# Patient Record
Sex: Male | Born: 1960 | Race: Black or African American | Hispanic: No | Marital: Single | Smoking: Never smoker
Health system: Southern US, Community
[De-identification: ages and names within clinical notes are randomized; demographics above are authoritative.]

## PROBLEM LIST (undated history)

## (undated) DIAGNOSIS — N183 Chronic kidney disease, stage 3 unspecified: Secondary | ICD-10-CM

## (undated) DIAGNOSIS — I10 Essential (primary) hypertension: Secondary | ICD-10-CM

## (undated) DIAGNOSIS — M1990 Unspecified osteoarthritis, unspecified site: Secondary | ICD-10-CM

## (undated) DIAGNOSIS — I4891 Unspecified atrial fibrillation: Secondary | ICD-10-CM

## (undated) DIAGNOSIS — I429 Cardiomyopathy, unspecified: Secondary | ICD-10-CM

## (undated) DIAGNOSIS — D638 Anemia in other chronic diseases classified elsewhere: Secondary | ICD-10-CM

## (undated) DIAGNOSIS — M109 Gout, unspecified: Secondary | ICD-10-CM

## (undated) DIAGNOSIS — G4733 Obstructive sleep apnea (adult) (pediatric): Secondary | ICD-10-CM

## (undated) HISTORY — DX: Obstructive sleep apnea (adult) (pediatric): G47.33

## (undated) HISTORY — DX: Anemia in other chronic diseases classified elsewhere: D63.8

## (undated) HISTORY — DX: Unspecified atrial fibrillation: I48.91

## (undated) HISTORY — DX: Chronic kidney disease, stage 3 unspecified: N18.30

---

## 2005-04-08 ENCOUNTER — Emergency Department: Payer: Self-pay | Admitting: Unknown Physician Specialty

## 2005-05-26 ENCOUNTER — Emergency Department: Payer: Self-pay | Admitting: Emergency Medicine

## 2010-04-01 ENCOUNTER — Emergency Department: Payer: Self-pay | Admitting: Emergency Medicine

## 2010-04-02 ENCOUNTER — Emergency Department: Payer: Self-pay | Admitting: Emergency Medicine

## 2011-10-16 ENCOUNTER — Emergency Department: Payer: Self-pay | Admitting: Unknown Physician Specialty

## 2013-09-10 ENCOUNTER — Emergency Department: Payer: Self-pay | Admitting: Emergency Medicine

## 2014-05-12 ENCOUNTER — Emergency Department: Payer: Self-pay | Admitting: Emergency Medicine

## 2014-05-15 ENCOUNTER — Emergency Department: Payer: Self-pay | Admitting: Emergency Medicine

## 2015-05-07 ENCOUNTER — Emergency Department
Admission: EM | Admit: 2015-05-07 | Discharge: 2015-05-07 | Disposition: A | Discharge status: Home / Self Care | Payer: PRIVATE HEALTH INSURANCE | Attending: Emergency Medicine | Admitting: Emergency Medicine

## 2015-05-07 ENCOUNTER — Encounter: Payer: Self-pay | Admitting: Emergency Medicine

## 2015-05-07 DIAGNOSIS — Z792 Long term (current) use of antibiotics: Secondary | ICD-10-CM | POA: Insufficient documentation

## 2015-05-07 DIAGNOSIS — L03115 Cellulitis of right lower limb: Secondary | ICD-10-CM | POA: Insufficient documentation

## 2015-05-07 MED ORDER — TRAMADOL HCL 50 MG PO TABS: 50.0000 mg | ORAL_TABLET | Freq: Four times a day (QID) | ORAL | Status: DC | PRN

## 2015-05-07 MED ORDER — SULFAMETHOXAZOLE-TRIMETHOPRIM 800-160 MG PO TABS: 1.0000 | ORAL_TABLET | Freq: Two times a day (BID) | ORAL | Status: DC

## 2015-05-07 NOTE — ED Notes (Addendum)
Pt presents to ER alert and in NAD. Pt reports abscess on left inner thigh x 1 week. Pt has small area of skin missing from left groin area. No obvious signs of infx.

## 2015-05-07 NOTE — ED Notes (Addendum)
Patient c/o 0.5"x0.25" open wound to his left medial anterior superior thigh. Denies fever

## 2015-05-07 NOTE — ED Provider Notes (Signed)
[facility] Emergency Department Provider Note  ____________________________________________  Time seen: Approximately07:23 AM  I have reviewed the triage vital signs and the nursing notes.   HISTORY  Chief Complaint Abscess    HPI Steven Pham is a 54 y.o. male who presents with 1 week history of left inner thigh pain and redness. He states the pain is getting worse and the area has been draining a clear/yellow discharge.   History reviewed. No pertinent past medical history.  There are no active problems to display for this patient.   History reviewed. No pertinent past surgical history.  Current Outpatient Rx  Name  Route  Sig  Dispense  Refill  . sulfamethoxazole-trimethoprim (BACTRIM DS,SEPTRA DS) 800-160 MG per tablet   Oral   Take 1 tablet by mouth 2 (two) times daily.   20 tablet   0   . traMADol (ULTRAM) 50 MG tablet   Oral   Take 1 tablet (50 mg total) by mouth every 6 (six) hours as needed.   9 tablet   0     Allergies Review of patient's allergies indicates no known allergies.  History reviewed. No pertinent family history.  Social History History  Substance Use Topics  . Smoking status: Never Smoker   . Smokeless tobacco: Not on file  . Alcohol Use: No    Review of Systems Constitutional: No fever/chills Eyes: No visual changes. ENT: No sore throat. Gastrointestinal: No abdominal pain.  No nausea, no vomiting.Musculoskeletal: Negative for back pain. Skin: Negative for rash. Positive for tenderness and draining of left upper leg near groin. Neurological: Negative for headaches, focal weakness or numbness.  10-point ROS otherwise negative.  ____________________________________________   PHYSICAL EXAM:  VITAL SIGNS: ED Triage Vitals  Enc Vitals Group     BP 05/07/15 0554 161/97 mmHg     Pulse Rate 05/07/15 0554 70     Resp 05/07/15 0554 18     Temp 05/07/15 0554 97.7 F (36.5 C)     Temp Source 05/07/15  0554 Oral     SpO2 05/07/15 0554 96 %     Weight 05/07/15 0554 245 lb (111.131 kg)     Height 05/07/15 0554  (1.6 m)     Head Cir --      Peak Flow --      Pain Score 05/07/15 0554 6     Pain Loc --      Pain Edu? --      Excl. in GC? --     Constitutional: Alert and oriented. Well appearing and in no acute distress. Eyes: Conjunctivae are normal. PERRL. EOMI. Head: Atraumatic. Nose: No congestion/rhinnorhea. Mouth/Throat: Mucous membranes are moist.   Neck: No stridor.   Cardiovascular: Normal rate, regular rhythm. Respiratory: Normal respiratory effort. Gastrointestinal: Soft and nontender. Musculoskeletal: No lower extremity tenderness nor edema.  No joint effusions. Neurologic:  Normal speech and language. No gross focal neurologic deficits are appreciated. Speech is normal. No gait instability. Skin:  Left inner thigh/groin erythematous with 0.5cm superficial ulceration. No drainage noted. Area of induration approximately 3cm. No fluctuant area identified. Psychiatric: Mood and affect are normal. Speech and behavior are normal.  ____________________________________________   LABS (all labs ordered are listed, but only abnormal results are displayed)  Labs Reviewed - No data to display ____________________________________________  EKG   ____________________________________________  RADIOLOGY  ____________________________________________   PROCEDURES  Procedure(s) performed: None  Critical Care performed: No  ____________________________________________   INITIAL IMPRESSION / ASSESSMENT AND PLAN /  ED COURSE  Pertinent labs & imaging results that were available during my care of the patient were reviewed by me and considered in my medical decision making (see chart for details).  Patient was advised to follow up with the primary care provider of his choice or return to the ER for symptoms that are not improving over the next 2 days. Return precautions  reviewed. ____________________________________________   FINAL CLINICAL IMPRESSION(S) / ED DIAGNOSES  Final diagnoses:  Cellulitis of right leg      Chinita Pester, FNP 05/07/15 1556  Sharyn Creamer, MD 05/13/15 1705

## 2015-05-12 ENCOUNTER — Emergency Department
Admission: EM | Admit: 2015-05-12 | Discharge: 2015-05-12 | Disposition: A | Discharge status: Home / Self Care | Payer: PRIVATE HEALTH INSURANCE | Attending: Emergency Medicine | Admitting: Emergency Medicine

## 2015-05-12 DIAGNOSIS — L02416 Cutaneous abscess of left lower limb: Secondary | ICD-10-CM | POA: Diagnosis not present

## 2015-05-12 DIAGNOSIS — L03116 Cellulitis of left lower limb: Secondary | ICD-10-CM | POA: Diagnosis not present

## 2015-05-12 DIAGNOSIS — Z7689 Persons encountering health services in other specified circumstances: Secondary | ICD-10-CM

## 2015-05-12 DIAGNOSIS — Z0189 Encounter for other specified special examinations: Secondary | ICD-10-CM

## 2015-05-12 DIAGNOSIS — L0291 Cutaneous abscess, unspecified: Secondary | ICD-10-CM

## 2015-05-12 DIAGNOSIS — L089 Local infection of the skin and subcutaneous tissue, unspecified: Secondary | ICD-10-CM | POA: Diagnosis present

## 2015-05-12 DIAGNOSIS — L0390 Cellulitis, unspecified: Secondary | ICD-10-CM

## 2015-05-12 MED ORDER — LIDOCAINE-EPINEPHRINE (PF) 1 %-1:200000 IJ SOLN
INTRAMUSCULAR | Status: AC
  Filled 2015-05-12: qty 30

## 2015-05-12 MED ORDER — SULFAMETHOXAZOLE-TRIMETHOPRIM 800-160 MG PO TABS: 2.0000 | ORAL_TABLET | Freq: Two times a day (BID) | ORAL | Status: DC

## 2015-05-12 NOTE — ED Provider Notes (Signed)
[facility] Emergency Department Provider Note ?____________________________________________ ? Time seen: 1046 ? I have reviewed the triage vital signs and the nursing notes. ________ HISTORY ? Chief Complaint Recurrent Skin Infections  HPI  Steven Pham is a 54 y.o. male who returns to the ED for a reevaluation of a left inner thigh abscess. He describes continuous drainage to the area. But notes it is still painful at present. He's been dosing the antibiotics as prescribed however only has one tablet left out of a 10 day course which is not set to expire until 5/18. He denies fevers, chills, sweats, nausea, vomiting, or spreading cellulitis.  No past medical history on file.  There are no active problems to display for this patient. ? No past surgical history on file. ? Current Outpatient Rx  Name  Route  Sig  Dispense  Refill  . sulfamethoxazole-trimethoprim (BACTRIM DS,SEPTRA DS) 800-160 MG per tablet   Oral   Take 2 tablets by mouth 2 (two) times daily.   30 tablet   0   . traMADol (ULTRAM) 50 MG tablet   Oral   Take 1 tablet (50 mg total) by mouth every 6 (six) hours as needed.   9 tablet   0   ? Allergies Review of patient's allergies indicates no known allergies. ? No family history on file. ? Social History History  Substance Use Topics  . Smoking status: Never Smoker   . Smokeless tobacco: Not on file  . Alcohol Use: No   Review of Systems Constitutional: Negative for fever. HEENT:  Normocephalic/atraumatic. Negative for visual/hearingchanges, sore throat, or nasal congestion. Cardiovascular: Negative for chest pain. Respiratory: Negative for shortness of breath. Musculoskeletal: Negative for back pain. Skin: Positive for draining abscess with local cellulitis. Neurological: Negative for headaches, focal weakness or numbness. Hematological/Lymphatic:Negative for enlarged lymph nodes  10-point ROS otherwise  negative. ____________________________________________  PHYSICAL EXAM:  VITAL SIGNS: ED Triage Vitals  Enc Vitals Group     BP 05/12/15 0959 138/95 mmHg     Pulse Rate 05/12/15 0959 65     Resp 05/12/15 0959 18     Temp 05/12/15 0959 97.5 F (36.4 C)     Temp Source 05/12/15 0959 Oral     SpO2 05/12/15 0959 100 %     Weight 05/12/15 0959 245 lb (111.131 kg)     Height 05/12/15 0959  (1.626 m)     Head Cir --      Peak Flow --      Pain Score 05/12/15 1000 8     Pain Loc --      Pain Edu? --      Excl. in GC? --    Constitutional: Alert and oriented. Well appearing and in no distress. HEENT: Normocephalic and atraumatic.Conjunctivae are normal. PERRL. Normal extraocular movements. Mucous membranes are moist. Respiratory: Normal respiratory effort.  Gastrointestinal: Soft and nontender. No distention.  Genitourinary: deferred Musculoskeletal: Nontender with normal range of motion in all extremities. No joint effusions.  No lower extremity tenderness nor edema. Neurologic:  Normal speech and language. No gross focal neurologic deficits are appreciated.  Skin:  Enlarging left inner thigh cellulitis with large, deep area in induration & loculation. Spontaneous drainage from fluctuant punctum.  Psychiatric: Mood and affect are normal. Speech and behavior are normal. Patient exhibits appropriate insight and judgment.  _____________ PROCEDURES ? INCISION AND DRAINAGE Performed by: Lissa Hoard Consent: Verbal consent obtained. Risks and benefits: risks, benefits and alternatives were discussed  Type: abscess  Body area: left thigh  Anesthesia: local infiltration  Incision was made with a #11 scalpel.  Local anesthetic: lidocaine 1% w/ epinephrine  Anesthetic total: 8 ml  Complexity: complex  Blunt dissection to break up loculations  Drainage: purulent  Drainage amount: 12 ml  Packing material: 1/4 in iodoform gauze  Patient tolerance: Patient  tolerated the procedure well with no immediate complications. ______________________________________________________ INITIAL IMPRESSION / ASSESSMENT AND PLAN / ED COURSE ? Return visit for wound check and subsequent I & D of large abscess and cellulitis. Wound care instructions given.  Will up-dose Bactrim DS to 2 tabs BID x 7 days more.  Return to the ED in 2-3 days for wound check and repacking.   Pertinent labs & imaging results that were available during my care of the patient were reviewed by me and considered in my medical decision making (see chart for details). ____________________________________________ FINAL CLINICAL IMPRESSION(S) / ED DIAGNOSES?  Final diagnoses:  Abscess and cellulitis  Encounter for incision and drainage procedure      Lissa Hoard, PA-C 05/12/15 1701  Sharyn Creamer, MD 05/13/15 1655

## 2015-05-12 NOTE — ED Notes (Signed)
Pt states she has abscess to left leg, states he was seen in ED for the same on Sunday, states he was prescribed an antibiotic and has 1 tablet left, states he still has abscess present after taking antibiotics, drainage present

## 2015-05-12 NOTE — Discharge Instructions (Signed)
Abscess An abscess (boil or furuncle) is an infected area on or under the skin. This area is filled with yellowish-white fluid (pus) and other material (debris). HOME CARE   Only take medicines as told by your doctor.  If you were given antibiotic medicine, take it as directed. Finish the medicine even if you start to feel better.  If gauze is used, follow your doctor's directions for changing the gauze.  To avoid spreading the infection:  Keep your abscess covered with a bandage.  Wash your hands well.  Do not share personal care items, towels, or whirlpools with others.  Avoid skin contact with others.  Keep your skin and clothes clean around the abscess.  Keep all doctor visits as told. GET HELP RIGHT AWAY IF:   You have more pain, puffiness (swelling), or redness in the wound site.  You have more fluid or blood coming from the wound site.  You have muscle aches, chills, or you feel sick.  You have a fever. MAKE SURE YOU:   Understand these instructions.  Will watch your condition.  Will get help right away if you are not doing well or get worse. Document Released: 06/03/2008 Document Revised: 06/16/2012 Document Reviewed: 02/28/2012 [facility] Patient Information 2015 [place], [place]. This information is not intended to replace advice given to you by your health care provider. Make sure you discuss any questions you have with your health care provider.  Incision and Drainage Incision and drainage is a procedure in which a sac-like structure (cystic structure) is opened and drained. The area to be drained usually contains material such as pus, fluid, or blood.  LET YOUR CAREGIVER KNOW ABOUT:   Allergies to medicine.  Medicines taken, including vitamins, herbs, eyedrops, over-the-counter medicines, and creams.  Use of steroids (by mouth or creams).  Previous problems with anesthetics or numbing medicines.  History of bleeding problems or blood clots.  Previous  surgery.  Other health problems, including diabetes and kidney problems.  Possibility of pregnancy, if this applies. RISKS AND COMPLICATIONS  Pain.  Bleeding.  Scarring.  Infection. BEFORE THE PROCEDURE  You may need to have an ultrasound or other imaging tests to see how large or deep your cystic structure is. Blood tests may also be used to determine if you have an infection or how severe the infection is. You may need to have a tetanus shot. PROCEDURE  The affected area is cleaned with a cleaning fluid. The cyst area will then be numbed with a medicine (local anesthetic). A small incision will be made in the cystic structure. A syringe or catheter may be used to drain the contents of the cystic structure, or the contents may be squeezed out. The area will then be flushed with a cleansing solution. After cleansing the area, it is often gently packed with a gauze or another wound dressing. Once it is packed, it will be covered with gauze and tape or some other type of wound dressing. AFTER THE PROCEDURE   Often, you will be allowed to go home right after the procedure.  You may be given antibiotic medicine to prevent or heal an infection.  If the area was packed with gauze or some other wound dressing, you will likely need to come back in 1 to 2 days to get it removed.  The area should heal in about 14 days. Document Released: 06/11/2001 Document Revised: 06/16/2012 Document Reviewed: 02/10/2012 [facility] Patient Information 2015 [place], [place]. This information is not intended to replace advice  given to you by your health care provider. Make sure you discuss any questions you have with your health care provider.   Keep the wound clean, dry, and covered.  Return to the ED in 3 days for wound check. Increase the antibiotic dosing to 2 tabs morning and 2 tabs at night. Apply warm compresses over the dressing to promote healing.

## 2015-05-14 ENCOUNTER — Emergency Department
Admission: EM | Admit: 2015-05-14 | Discharge: 2015-05-14 | Disposition: A | Discharge status: Home / Self Care | Payer: PRIVATE HEALTH INSURANCE | Attending: Emergency Medicine | Admitting: Emergency Medicine

## 2015-05-14 DIAGNOSIS — Z4801 Encounter for change or removal of surgical wound dressing: Secondary | ICD-10-CM | POA: Insufficient documentation

## 2015-05-14 DIAGNOSIS — Z5189 Encounter for other specified aftercare: Secondary | ICD-10-CM

## 2015-05-14 DIAGNOSIS — Z792 Long term (current) use of antibiotics: Secondary | ICD-10-CM | POA: Insufficient documentation

## 2015-05-14 NOTE — ED Notes (Signed)
Pt reports he had a groin wound and it was lanced several days ago, pt states he was told to come back. Pt reports his packing is still in wound, he has not gotten his Rx filled yet.

## 2015-05-14 NOTE — Discharge Instructions (Signed)

## 2015-05-14 NOTE — ED Notes (Signed)
NAD noted at time of D/C. Pt denies questions or concerns. Pt ambulatory to the lobby at this time.  

## 2015-05-14 NOTE — ED Provider Notes (Signed)
[facility] Emergency Department Provider Note  ____________________________________________  Time seen: 7:15 PM  I have reviewed the triage vital signs and the nursing notes.   HISTORY  Chief Complaint Wound Check      HPI Steven Pham is a 54 y.o. male who presents for wound recheck of left groin abscess that was incised 2 days ago. He reports the pain is improved but does still have some drainage. Has not been taking antibiotics as he will be able to get them today.     No past medical history on file.  There are no active problems to display for this patient.   No past surgical history on file.  Current Outpatient Rx  Name  Route  Sig  Dispense  Refill  . sulfamethoxazole-trimethoprim (BACTRIM DS,SEPTRA DS) 800-160 MG per tablet   Oral   Take 2 tablets by mouth 2 (two) times daily.   30 tablet   0   . traMADol (ULTRAM) 50 MG tablet   Oral   Take 1 tablet (50 mg total) by mouth every 6 (six) hours as needed.   9 tablet   0     Allergies Review of patient's allergies indicates no known allergies.  No family history on file.  Social History History  Substance Use Topics  . Smoking status: Never Smoker   . Smokeless tobacco: Not on file  . Alcohol Use: No    Review of Systems  Constitutional: Negative for fever.  Gastrointestinal: Negative for abdominal pain, vomiting and diarrhea. Genitourinary: Negative for dysuria. Musculoskeletal: Negative for back pain. Skin: Negative for rash. Neurological: Negative for headaches, focal weakness or numbness.     ____________________________________________   PHYSICAL EXAM:  VITAL SIGNS: ED Triage Vitals  Enc Vitals Group     BP 05/14/15 0652 142/86 mmHg     Pulse Rate 05/14/15 0652 65     Resp 05/14/15 0652 18     Temp 05/14/15 0652 97.4 F (36.3 C)     Temp Source 05/14/15 0652 Oral     SpO2 05/14/15 0652 98 %     Weight 05/14/15 0652 245 lb (111.131 kg)     Height  05/14/15 0652  (1.6 m)     Head Cir --      Peak Flow --      Pain Score 05/14/15 0652 8     Pain Loc --      Pain Edu? --      Excl. in GC? --    Constitutional: Alert and oriented. Well appearing and in no distress.  Cardiovascular: Normal rate, regular rhythm. Normal and symmetric distal pulses are present in all extremities. No murmurs, rubs, or gallops. Respiratory: Normal respiratory effort without tachypnea nor retractions. Breath sounds are clear and equal bilaterally. No wheezes/rales/rhonchi. Gastrointestinal: Soft and nontender. No distention. There is no CVA tenderness. Genitourinary: deferred Musculoskeletal: Nontender with normal range of motion in all extremities. No joint effusions.  No lower extremity tenderness nor edema.  Skin:  3 x 3 cm left groin abscess with packing in place. No stranding erythema, continued. At discharge. No tenderness to palpation.   ____________________________________________    LABS (pertinent positives/negatives)  *None  ____________________________________________   EKG  None  ____________________________________________    RADIOLOGY  None  ____________________________________________   PROCEDURES  Procedure(s) performed:yes  Removed current packing. And replaced with quarter inch iodoform.  Critical Care performed:None  ____________________________________________   INITIAL IMPRESSION / ASSESSMENT AND PLAN / ED COURSE  Pertinent labs & imaging results that were available during my care of the patient were reviewed by me and considered in my medical decision making (see chart for details).  Impression is healing abscess, repacked. Patient follow-up in 2 days in the ED for recheck  ____________________________________________   FINAL CLINICAL IMPRESSION(S) / ED DIAGNOSES  Final diagnoses:  Abscess re-check     Jene Every, MD 05/14/15 [phone]

## 2016-04-27 ENCOUNTER — Encounter: Payer: Self-pay | Admitting: Emergency Medicine

## 2016-04-27 ENCOUNTER — Emergency Department
Admission: EM | Admit: 2016-04-27 | Discharge: 2016-04-27 | Disposition: A | Discharge status: Home / Self Care | Payer: PRIVATE HEALTH INSURANCE | Attending: Emergency Medicine | Admitting: Emergency Medicine

## 2016-04-27 DIAGNOSIS — Y9355 Activity, bike riding: Secondary | ICD-10-CM | POA: Insufficient documentation

## 2016-04-27 DIAGNOSIS — T1592XA Foreign body on external eye, part unspecified, left eye, initial encounter: Secondary | ICD-10-CM | POA: Diagnosis present

## 2016-04-27 DIAGNOSIS — X58XXXA Exposure to other specified factors, initial encounter: Secondary | ICD-10-CM | POA: Diagnosis not present

## 2016-04-27 DIAGNOSIS — Y929 Unspecified place or not applicable: Secondary | ICD-10-CM | POA: Diagnosis not present

## 2016-04-27 DIAGNOSIS — Y999 Unspecified external cause status: Secondary | ICD-10-CM | POA: Insufficient documentation

## 2016-04-27 MED ORDER — FLUORESCEIN SODIUM 1 MG OP STRP
1.0000 | ORAL_STRIP | Freq: Once | OPHTHALMIC | Status: AC
  Administered 2016-04-27: 1 via OPHTHALMIC
  Filled 2016-04-27: qty 1

## 2016-04-27 MED ORDER — ERYTHROMYCIN 5 MG/GM OP OINT
1.0000 | TOPICAL_OINTMENT | Freq: Four times a day (QID) | OPHTHALMIC | Status: DC
Start: 2016-04-27 — End: 2016-04-27
  Administered 2016-04-27: 1 via OPHTHALMIC
  Filled 2016-04-27: qty 1

## 2016-04-27 MED ORDER — POLYMYXIN B-TRIMETHOPRIM 10000-0.1 UNIT/ML-% OP SOLN: 2.0000 [drp] | Freq: Four times a day (QID) | OPHTHALMIC | Status: AC
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
## 2016-04-27 NOTE — Discharge Instructions (Signed)
Eye Foreign Body A foreign body refers to any object on the surface of the eye or in the eyeball that should not be there. A foreign body may be a small speck of dirt or dust, a hair or eyelash, a splinter, or any other object.  SIGNS AND SYMPTOMS Symptoms depend on what the foreign body is and where it is in the eye. The most common locations are:   On the inner surface of the upper or lower eyelids or on the covering of the white part of the eye (conjunctiva). Symptoms in this location are:  Pain and irritation, especially when blinking.  The feeling that something is in the eye.  On the surface of the clear covering on the front of the eye (cornea). Symptoms in this location include:  Pain and irritation.   Small "rust rings" around a metallic foreign body.  The feeling that something is in the eye.   Inside the eyeball. Foreign bodies inside the eye may cause:   Great pain.   Immediate loss of vision.   Distortion of the pupil. DIAGNOSIS  Foreign bodies are found during an exam by an eye specialist. Those on the eyelids, conjunctiva, or cornea are usually (but not always) easily found. When a foreign body is inside the eyeball, a cloudiness of the lens (cataract) may form almost right away. This makes it hard for an eye specialist to find the foreign body. Tests may be needed, including ultrasound testing, X-rays, and CT scans. TREATMENT   Foreign bodies on the eyelids, conjunctiva, or cornea are often removed easily and painlessly.  Rust in the cornea may require the use of a drill-like instrument to remove the rust.  If the foreign body has caused a scratch or a rubbing or scraping (abrasion) of the cornea, this may be treated with antibiotic drops or ointment. A pressure patch may be put over your eye.  If the foreign body is inside your eyeball, surgery is needed right away. This is a medical emergency. Foreign bodies inside the eye threaten vision. A person may even  lose his or her eye. HOME CARE INSTRUCTIONS   Take medicines only as directed by your health care provider. Use eye drops or ointment as directed.  If no eye patch was applied:  Keep your eye closed as much as possible.  Do not rub your eye.  Wear dark glasses as needed to protect your eyes from bright light.  Do not wear contact lenses until your eye feels normal again, or as instructed by your health care provider.  Wear a protective eye covering if there is a risk of eye injury. This is important when working with high-speed tools.  If your eye is patched:  Follow your health care provider's instructions for when to remove the patch.  Do notdrive or operate machinery if your eye is patched. Your ability to judge distances is impaired.  Keep all follow-up visits as directed by your health care provider. This is important. SEEK MEDICAL CARE IF:   You have increased pain in your eye.  Your vision gets worse.   You have problems with your eye patch.   You have fluid (discharge) coming from your injured eye.   You have redness and swelling around your affected eye.  MAKE SURE YOU:   Understand these instructions.  Will watch your condition.  Will get help right away if you are not doing well or get worse.   This information is not intended to  replace advice given to you by your health care provider. Make sure you discuss any questions you have with your health care provider.   Document Released: 12/16/2005 Document Revised: 01/06/2015 Document Reviewed: 05/13/2013 [employer] Interactive Patient Education [employer]   Your foreign body has been removed. Use the ear drops as directed. Follow-up with Dr. Inez Pilgrim as needed.

## 2016-04-27 NOTE — ED Provider Notes (Signed)
[facility] Emergency Department Provider Note ____________________________________________  Time seen: 1055  I have reviewed the triage vital signs and the nursing notes.  HISTORY  Chief Complaint  Eye Problem  HPI Steven Pham is a 55 y.o. male presents to the ED for evaluation of a foreign body sensation to his left eye. He describes a driving his motor scooter about 5 days prior to arrival, when he felt a foreign body sensation immediately to the left eye. He was able to rinse the eye once he got to work, and thought that he resolved foreign body. He didn't noted the return of foreign body sensation about 2 days later. Since that time he's witnessed a gross foreign body to the lower aspect of his left eye. He denies any interim headache, nausea, vomiting, dizziness, or vision change.He reports discomfort to the left eye at a 1/10 in triage.  History reviewed. No pertinent past medical history.  There are no active problems to display for this patient.   History reviewed. No pertinent past surgical history.  Current Outpatient Rx  Name  Route  Sig  Dispense  Refill  . sulfamethoxazole-trimethoprim (BACTRIM DS,SEPTRA DS) 800-160 MG per tablet   Oral   Take 2 tablets by mouth 2 (two) times daily.   30 tablet   0   . traMADol (ULTRAM) 50 MG tablet   Oral   Take 1 tablet (50 mg total) by mouth every 6 (six) hours as needed.   9 tablet   0   . trimethoprim-polymyxin b (POLYTRIM) ophthalmic solution   Left Eye   Place 2 drops into the left eye every 6 (six) hours.   10 mL   0    Allergies Review of patient's allergies indicates no known allergies.  No family history on file.  Social History Social History  Substance Use Topics  . Smoking status: Never Smoker   . Smokeless tobacco: None  . Alcohol Use: No   Review of Systems  Constitutional: Negative for fever. Eyes: Negative for visual changes. Left eye FBS as above.  Musculoskeletal:  Negative for back pain. Skin: Negative for rash. Neurological: Negative for headaches, focal weakness or numbness. ____________________________________________  PHYSICAL EXAM:  VITAL SIGNS: ED Triage Vitals  Enc Vitals Group     BP 04/27/16 1017 158/99 mmHg     Pulse Rate 04/27/16 1017 75     Resp 04/27/16 1017 18     Temp 04/27/16 1017 98.7 F (37.1 C)     Temp Source 04/27/16 1017 Oral     SpO2 04/27/16 1017 97 %     Weight 04/27/16 1017 255 lb (115.667 kg)     Height 04/27/16 1017  (1.6 m)     Head Cir --      Peak Flow --      Pain Score 04/27/16 1012 1     Pain Loc --      Pain Edu? --      Excl. in GC? --    Constitutional: Alert and oriented. Well appearing and in no distress. Head: Normocephalic and atraumatic. Eyes: Conjunctivae are normal, except the left eye which is focally injected over the sclera in the lower, outer quadrant. PERRL. Normal extraocular movements. Gross foreign body noted to the conjunctiva beyond the limbus at about 5 o'clock. No fluorescein dye uptake noted.  Hematological/Lymphatic/Immunological: No preauricular lymphadenopathy. Respiratory: Normal respiratory effort.  Musculoskeletal: Nontender with normal range of motion in all extremities.  Neurologic:  Normal gait  without ataxia. Normal speech and language. No gross focal neurologic deficits are appreciated. Skin:  Skin is warm, dry and intact. No rash noted. ____________________________________________  PROCEDURES  Erythromycin ophthalmic ointment Tetracaine ophthalmic solution ii gtts x 2 Foreign body removed without difficulty using sterile, dampened cotton swab    Visual Acuity  Right Eye Distance: 20/20 Left Eye Distance: 20/20 Bilateral Distance:   ____________________________________________  INITIAL IMPRESSION / ASSESSMENT AND PLAN / ED COURSE  ----------------------------------------- 11:15 AM on 04/27/2016 ----------------------------------------- Spoke with Dr.  Inez Pilgrim. He reviewed the case with me and confirmed likely superficial foreign body. He suggested attempted removal and office follow-up if necessary.   After successful removal of the superficial foreign body, the patient is discharged with a prescription for Polytrim ophthalmic solution to be dose as directed. He is provided with information for follow-up with Dr. Inez Pilgrim as needed. ____________________________________________  FINAL CLINICAL IMPRESSION(S) / ED DIAGNOSES  Final diagnoses:  Foreign body in eye, left, initial encounter      Lissa Hoard, PA-C 04/27/16 1220  Governor Rooks, MD 04/27/16 1337

## 2016-04-27 NOTE — ED Notes (Signed)
Discussed discharge instructions, prescriptions, and follow-up care with patient. No questions or concerns at this time. Pt stable at discharge.  

## 2016-10-27 ENCOUNTER — Encounter: Payer: Self-pay | Admitting: Emergency Medicine

## 2016-10-27 ENCOUNTER — Emergency Department
Admission: EM | Admit: 2016-10-27 | Discharge: 2016-10-27 | Disposition: A | Discharge status: Home / Self Care | Payer: PRIVATE HEALTH INSURANCE | Attending: Emergency Medicine | Admitting: Emergency Medicine

## 2016-10-27 DIAGNOSIS — M25561 Pain in right knee: Secondary | ICD-10-CM | POA: Diagnosis not present

## 2016-10-27 DIAGNOSIS — Z79899 Other long term (current) drug therapy: Secondary | ICD-10-CM | POA: Insufficient documentation

## 2016-10-27 DIAGNOSIS — G8929 Other chronic pain: Secondary | ICD-10-CM | POA: Diagnosis not present

## 2016-10-27 MED ORDER — MELOXICAM 15 MG PO TABS
15.0000 mg | ORAL_TABLET | Freq: Every day | ORAL | 0 refills | Status: DC
Start: 2016-10-27 — End: 2019-10-30

## 2016-10-27 NOTE — ED Triage Notes (Signed)
C/O right knee pain x 2-3 months.  STates has taken ibuprofen, which does relieve the pain.

## 2016-10-27 NOTE — ED Provider Notes (Signed)
[facility]-EMERGENCY DEPARTMENT Provider Note   CSN: [id number] Arrival date & time: 10/27/16  1120     History   Chief Complaint Chief Complaint  Patient presents with  . Knee Pain    HPI Steven Pham is a 55 y.o. male presents to the emergency department for evaluation of right knee pain. The pain isn't present for at least 3 months with no trauma or injury. Patient has been ambulatory going to work and walking with intermittent pains. He takes ibuprofen with occasional mild improvement. Pain is generalized throughout the right knee, located along the medial and lateral joint line. His pain is currently mild he describes the pain as an ache that is increased with walking. He is stiff in the morning. He notes increased discomfort with cold rainy weather. He denies any warmth, fevers, numbness tingling or radicular symptoms. Again no trauma, no catching/locking/buckling.  HPI  History reviewed. No pertinent past medical history.  There are no active problems to display for this patient.   History reviewed. No pertinent surgical history.     Home Medications    Prior to Admission medications   Medication Sig Start Date End Date Taking? Authorizing Provider  meloxicam (MOBIC) 15 MG tablet Take 1 tablet (15 mg total) by mouth daily. 10/27/16   Evon Slack, PA-C  sulfamethoxazole-trimethoprim (BACTRIM DS,SEPTRA DS) 800-160 MG per tablet Take 2 tablets by mouth 2 (two) times daily. 05/12/15   Jenise V Bacon Menshew, PA-C  traMADol (ULTRAM) 50 MG tablet Take 1 tablet (50 mg total) by mouth every 6 (six) hours as needed. 05/07/15   Chinita Pester, FNP    Family History No family history on file.  Social History Social History  Substance Use Topics  . Smoking status: Never Smoker  . Smokeless tobacco: Never Used  . Alcohol use No     Allergies   Review of patient's allergies indicates no known allergies.   Review of Systems Review of Systems  Constitutional: Negative  for chills and fever.  HENT: Negative for ear pain and sore throat.   Eyes: Negative for pain and visual disturbance.  Respiratory: Negative for cough and shortness of breath.   Cardiovascular: Negative for chest pain and palpitations.  Gastrointestinal: Negative for abdominal pain and vomiting.  Genitourinary: Negative for dysuria and hematuria.  Musculoskeletal: Positive for arthralgias. Negative for back pain.  Skin: Negative for color change and rash.  Neurological: Negative for seizures and syncope.  All other systems reviewed and are negative.    Physical Exam Updated Vital Signs BP (!) 153/93 (BP Location: Left Arm)   Pulse 70   Temp 97.8 F (36.6 C) (Oral)   Resp 16   Ht 5\' 2"  (1.575 m)   Wt 116.1 kg   SpO2 96%   BMI 46.82 kg/m   Physical Exam  Constitutional: He is oriented to person, place, and time. He appears well-developed and well-nourished.  HENT:  Head: Normocephalic and atraumatic.  Eyes: Conjunctivae and EOM are normal. Pupils are equal, round, and reactive to light.  Neck: Normal range of motion. Neck supple.  Cardiovascular: Normal rate, regular rhythm, normal heart sounds and intact distal pulses.   No murmur heard. Pulmonary/Chest: Effort normal and breath sounds normal. No respiratory distress.  Abdominal: Soft. There is no tenderness.  Musculoskeletal:  Right Lower Extremities: Examination of the right lower extremity reveals no bony abnormality, no edema, no effusion and no ecchymosis.  There is mild valgus abnormality.  The patient is non-tender along the  lateral joint line, and is non-tender along the medial joint line.  The patient has full knee flexion and extension. There is no discomfort with range of motion exercises.  The patient has a negative rotational Mcmurray test.  There is no retropatellar discomfort.  The patient has a negative patella stretch test.  The patient has a negative varus stress test and a negative valgus stress test, in  looking for stability.  The patient has a negative Lachman's test.  Vascular: The patient has a negative Denna HaggardHomans' test bilaterally.  The patient had a normal dorsalis pedis and posterior tibial pulse.  There is normal skin warmth.  There is normal capillary refill bilaterally.    Neurologic: The patient has a negative straight leg raise.  The patient has normal muscle strength testing for the quadriceps, calves, ankle dorsiflexion, ankle plantarflexion, and extensor hallicus longus. The patient has sensation that is intact to light touch.  The deep tendon reflexes are normal at the  Neurological: He is alert and oriented to person, place, and time.  Skin: Skin is warm and dry. No erythema.  Psychiatric: He has a normal mood and affect.  Nursing note and vitals reviewed.    ED Treatments / Results  Labs (all labs ordered are listed, but only abnormal results are displayed) Labs Reviewed - No data to display  EKG  EKG Interpretation None       Radiology No results found.  Procedures Procedures (including critical care time)  Medications Ordered in ED Medications - No data to display   Initial Impression / Assessment and Plan / ED Course  I have reviewed the triage vital signs and the nursing notes.  Pertinent labs & imaging results that were available during my care of the patient were reviewed by me and considered in my medical decision making (see chart for details).  Clinical Course   55 year old male with right knee pain. Pain 7 present for several months. No trauma or injury. He is currently ambulating and has been working standing on his feet. He has no signs of infection, deformity. Signs and symptoms consistent with arthritis, no signs of internal derangement. He has minimal improvement with ibuprofen. Will start patient on meloxicam. He can take Tylenol with the meloxicam. He will follow-up with orthopedics for evaluation.  Final Clinical Impressions(s) / ED  Diagnoses   Final diagnoses:  Chronic pain of right knee    New Prescriptions Discharge Medication List as of 10/27/2016 11:45 AM    START taking these medications   Details  meloxicam (MOBIC) 15 MG tablet Take 1 tablet (15 mg total) by mouth daily., Starting Sun 10/27/2016, Print         Evon Slackhomas C Keynan Heffern, PA-C 10/27/16 1157    Jeanmarie PlantJames A McShane, MD 10/27/16 1250

## 2016-10-27 NOTE — Discharge Instructions (Signed)
Please take meloxicam 1 tablet daily with food. Do not take ibuprofen or naproxen. He may take Tylenol with meloxicam. Rest ice and elevate knee. Follow-up with orthopedics for evaluation of the right knee pain.

## 2016-10-27 NOTE — ED Notes (Signed)
Patient reports right knee pain for the past 2-3 months. Pt denies any injury but does work on knees at times. Denies any swelling or tenderness. Pt thinks it could be arthritis or gout which he states he has a hx of. Pt rides a scooter around and does a lot of standing as well. Pt is able to ambulate.

## 2017-08-27 ENCOUNTER — Encounter: Payer: Self-pay | Admitting: Emergency Medicine

## 2017-08-27 ENCOUNTER — Emergency Department
Admission: EM | Admit: 2017-08-27 | Discharge: 2017-08-27 | Disposition: A | Discharge status: Home / Self Care | Payer: PRIVATE HEALTH INSURANCE | Attending: Emergency Medicine | Admitting: Emergency Medicine

## 2017-08-27 DIAGNOSIS — K0889 Other specified disorders of teeth and supporting structures: Secondary | ICD-10-CM | POA: Diagnosis present

## 2017-08-27 DIAGNOSIS — K029 Dental caries, unspecified: Secondary | ICD-10-CM | POA: Diagnosis not present

## 2017-08-27 DIAGNOSIS — Z79899 Other long term (current) drug therapy: Secondary | ICD-10-CM | POA: Insufficient documentation

## 2017-08-27 MED ORDER — AMOXICILLIN 500 MG PO CAPS: 500.0000 mg | ORAL_CAPSULE | Freq: Three times a day (TID) | ORAL | 0 refills | Status: DC

## 2017-08-27 MED ORDER — IBUPROFEN 600 MG PO TABS: 600.0000 mg | ORAL_TABLET | Freq: Three times a day (TID) | ORAL | 0 refills | Status: DC | PRN

## 2017-08-27 MED ORDER — TRAMADOL HCL 50 MG PO TABS: 50.0000 mg | ORAL_TABLET | Freq: Four times a day (QID) | ORAL | 0 refills | Status: DC | PRN

## 2017-08-27 NOTE — ED Triage Notes (Signed)
Patient presents to ED via POV from home with c/o right sided dental pain.  

## 2017-08-27 NOTE — ED Provider Notes (Signed)
[facility] Emergency Department Provider Note   ____________________________________________   First MD Initiated Contact with Patient 08/27/17 1417     (approximate)  I have reviewed the triage vital signs and the nursing notes.   HISTORY  Chief Complaint Dental Pain    HPI Steven Pham is a 56 y.o. male patient complaining of dental pain involving right upper incisors. Patient is a long-term history of devitalized teeth. Patient stated the pain has increased in the last 2 days. Patient denies any fever associated this complaint. Patient also complaining of gingival edema. Patient rates the pain as a 5/10. Patient described a pain as "achy". No palliative measures for complaint. Patient does not have a dentist.   History reviewed. No pertinent past medical history.  There are no active problems to display for this patient.   History reviewed. No pertinent surgical history.  Prior to Admission medications   Medication Sig Start Date End Date Taking? Authorizing Provider  amoxicillin (AMOXIL) 500 MG capsule Take 1 capsule (500 mg total) by mouth 3 (three) times daily. 08/27/17   Joni Reining, PA-C  ibuprofen (ADVIL,MOTRIN) 600 MG tablet Take 1 tablet (600 mg total) by mouth every 8 (eight) hours as needed. 08/27/17   Joni Reining, PA-C  meloxicam (MOBIC) 15 MG tablet Take 1 tablet (15 mg total) by mouth daily. 10/27/16   Evon Slack, PA-C  sulfamethoxazole-trimethoprim (BACTRIM DS,SEPTRA DS) 800-160 MG per tablet Take 2 tablets by mouth 2 (two) times daily. 05/12/15   Menshew, Charlesetta Ivory, PA-C  traMADol (ULTRAM) 50 MG tablet Take 1 tablet (50 mg total) by mouth every 6 (six) hours as needed. 05/07/15   Triplett, Rulon Eisenmenger B, FNP  traMADol (ULTRAM) 50 MG tablet Take 1 tablet (50 mg total) by mouth every 6 (six) hours as needed for moderate pain. 08/27/17   Joni Reining, PA-C    Allergies Patient has no known allergies.  No family history on  file.  Social History Social History  Substance Use Topics  . Smoking status: Never Smoker  . Smokeless tobacco: Never Used  . Alcohol use No    Review of Systems Constitutional: No fever/chills Eyes: No visual changes. ENT: No sore throat. Dental pain Cardiovascular: Denies chest pain. Respiratory: Denies shortness of breath. Gastrointestinal: No abdominal pain.  No nausea, no vomiting.  No diarrhea.  No constipation. Genitourinary: Negative for dysuria. Musculoskeletal: Negative for back pain. Skin: Negative for rash. Neurological: Negative for headaches, focal weakness or numbness.   ____________________________________________   PHYSICAL EXAM:  VITAL SIGNS: ED Triage Vitals  Enc Vitals Group     BP 08/27/17 1356 (!) 154/108     Pulse Rate 08/27/17 1355 86     Resp 08/27/17 1355 15     Temp 08/27/17 1355 99 F (37.2 C)     Temp Source 08/27/17 1355 Oral     SpO2 08/27/17 1355 98 %     Weight 08/27/17 1355 245 lb (111.1 kg)     Height 08/27/17 1355 5\' 2"  (1.575 m)     Head Circumference --      Peak Flow --      Pain Score 08/27/17 1355 5     Pain Loc --      Pain Edu? --      Excl. in GC? --     Constitutional: Alert and oriented. Well appearing and in no acute distress. Mouth/Throat: Mucous membranes are moist.  Oropharynx non-erythematous. Devitalize tooth #9 and  10 Hematological/Lymphatic/Immunilogical: No cervical lymphadenopathy. Cardiovascular: Normal rate, regular rhythm. Grossly normal heart sounds.  Good peripheral circulation. Elevated blood pressure Respiratory: Normal respiratory effort.  No retractions. Lungs CTAB. Neurologic:  Normal speech and language. No gross focal neurologic deficits are appreciated. No gait instability. Skin:  Skin is warm, dry and intact. No rash noted. Psychiatric: Mood and affect are normal. Speech and behavior are normal.  ____________________________________________   LABS (all labs ordered are listed, but only  abnormal results are displayed)  Labs Reviewed - No data to display ____________________________________________  EKG   ____________________________________________  RADIOLOGY  No results found.  ____________________________________________   PROCEDURES  Procedure(s) performed: None  Procedures  Critical Care performed: No  ____________________________________________   INITIAL IMPRESSION / ASSESSMENT AND PLAN / ED COURSE  Pertinent labs & imaging results that were available during my care of the patient were reviewed by me and considered in my medical decision making (see chart for details).  Dental pain secondary to fracture and devitalize teeth. She given discharge care instruction. Patient advised to follow-up at the walk-in dental clinic tomorrow morning.      ____________________________________________   FINAL CLINICAL IMPRESSION(S) / ED DIAGNOSES  Final diagnoses:  Pain due to dental caries      NEW MEDICATIONS STARTED DURING THIS VISIT:  New Prescriptions   AMOXICILLIN (AMOXIL) 500 MG CAPSULE    Take 1 capsule (500 mg total) by mouth 3 (three) times daily.   IBUPROFEN (ADVIL,MOTRIN) 600 MG TABLET    Take 1 tablet (600 mg total) by mouth every 8 (eight) hours as needed.   TRAMADOL (ULTRAM) 50 MG TABLET    Take 1 tablet (50 mg total) by mouth every 6 (six) hours as needed for moderate pain.     Note:  This document was prepared using Dragon voice recognition software and may include unintentional dictation errors.    Joni Reining, PA-C 08/27/17 1428    Minna Antis, MD 08/27/17 [id number]

## 2017-08-27 NOTE — ED Notes (Signed)
See triage noted  States he has a couple of cavities and developed increased pain to right upper gumline  No fever   Positive facial swelling

## 2017-08-27 NOTE — Discharge Instructions (Signed)
Advised report to the walk-in department clinic tomorrow morning.

## 2018-01-30 ENCOUNTER — Emergency Department: Payer: PRIVATE HEALTH INSURANCE

## 2018-01-30 ENCOUNTER — Encounter: Payer: Self-pay | Admitting: Emergency Medicine

## 2018-01-30 ENCOUNTER — Emergency Department
Admission: EM | Admit: 2018-01-30 | Discharge: 2018-01-30 | Disposition: A | Discharge status: Home / Self Care | Payer: PRIVATE HEALTH INSURANCE | Attending: Emergency Medicine | Admitting: Emergency Medicine

## 2018-01-30 DIAGNOSIS — M19042 Primary osteoarthritis, left hand: Secondary | ICD-10-CM | POA: Insufficient documentation

## 2018-01-30 DIAGNOSIS — Z79899 Other long term (current) drug therapy: Secondary | ICD-10-CM | POA: Insufficient documentation

## 2018-01-30 MED ORDER — MELOXICAM 15 MG PO TABS: 15.0000 mg | ORAL_TABLET | Freq: Every day | ORAL | 0 refills | Status: DC

## 2018-01-30 NOTE — ED Triage Notes (Signed)
Pt reports injured left hand middle finger a year ago and now when the weather gets cold he has pain in his finger.

## 2018-01-30 NOTE — ED Notes (Signed)
See triage note  Presents with finger pain   Denies any new injury   States when it gets cold the finger swells and is painful

## 2018-01-30 NOTE — ED Provider Notes (Signed)
[facility] Emergency Department Provider Note   ____________________________________________   First MD Initiated Contact with Patient 01/30/18 1131     (approximate)  I have reviewed the triage vital signs and the nursing notes.   HISTORY  Chief Complaint Finger Injury    HPI Steven Pham is a 57 y.o. male patient complained of pain/edema, but no erythema to the distal third digit left hand.  Patient states pain is intermittent and has been present for 1 year.  Patient states status post "suspected fracture" which never evaluated.  Patient pain is worse with cold weather.  Patient has a history of arthritis.  She rates the pain is 7/10.  Patient described the pain as "aching".  No palates measured for complaint.  History reviewed. No pertinent past medical history.  There are no active problems to display for this patient.   History reviewed. No pertinent surgical history.  Prior to Admission medications   Medication Sig Start Date End Date Taking? Authorizing Provider  amoxicillin (AMOXIL) 500 MG capsule Take 1 capsule (500 mg total) by mouth 3 (three) times daily. 08/27/17   Joni Reining, PA-C  ibuprofen (ADVIL,MOTRIN) 600 MG tablet Take 1 tablet (600 mg total) by mouth every 8 (eight) hours as needed. 08/27/17   Joni Reining, PA-C  meloxicam (MOBIC) 15 MG tablet Take 1 tablet (15 mg total) by mouth daily. 10/27/16   Evon Slack, PA-C  meloxicam (MOBIC) 15 MG tablet Take 1 tablet (15 mg total) by mouth daily. 01/30/18   Joni Reining, PA-C  sulfamethoxazole-trimethoprim (BACTRIM DS,SEPTRA DS) 800-160 MG per tablet Take 2 tablets by mouth 2 (two) times daily. 05/12/15   Menshew, Charlesetta Ivory, PA-C  traMADol (ULTRAM) 50 MG tablet Take 1 tablet (50 mg total) by mouth every 6 (six) hours as needed. 05/07/15   Triplett, Rulon Eisenmenger B, FNP  traMADol (ULTRAM) 50 MG tablet Take 1 tablet (50 mg total) by mouth every 6 (six) hours as needed for moderate pain.  08/27/17   Joni Reining, PA-C    Allergies Patient has no known allergies.  No family history on file.  Social History Social History   Tobacco Use  . Smoking status: Never Smoker  . Smokeless tobacco: Never Used  Substance Use Topics  . Alcohol use: No  . Drug use: No    Review of Systems Constitutional: No fever/chills Eyes: No visual changes. ENT: No sore throat. Cardiovascular: Denies chest pain. Respiratory: Denies shortness of breath. Gastrointestinal: No abdominal pain.  No nausea, no vomiting.  No diarrhea.  No constipation. Genitourinary: Negative for dysuria. Musculoskeletal: Pain third digit left hand Skin: Negative for rash. Neurological: Negative for headaches, focal weakness or numbness.   ____________________________________________   PHYSICAL EXAM:  VITAL SIGNS: ED Triage Vitals  Enc Vitals Group     BP 01/30/18 1124 (!) 147/102     Pulse Rate 01/30/18 1122 62     Resp 01/30/18 1122 20     Temp 01/30/18 1122 (!) 97.4 F (36.3 C)     Temp Source 01/30/18 1122 Oral     SpO2 --      Weight 01/30/18 1123 256 lb (116.1 kg)     Height 01/30/18 1123 5\' 6"  (1.676 m)     Head Circumference --      Peak Flow --      Pain Score 01/30/18 1123 7     Pain Loc --      Pain Edu? --  Excl. in GC? --    Constitutional: Alert and oriented. Well appearing and in no acute distress. Cardiovascular: Normal rate, regular rhythm. Grossly normal heart sounds.  Good peripheral circulation. Respiratory: Normal respiratory effort.  No retractions. Lungs CTAB. Musculoskeletal: Obvious deformity to the distal phalangeal joint of the third digit right hand.  Decreased range of motion with flexion of the DPJ.  Neurologic:  Normal speech and language. No gross focal neurologic deficits are appreciated. No gait instability. Skin:  Skin is warm, dry and intact. No rash noted.  No erythema or lesions. Psychiatric: Mood and affect are normal. Speech and behavior are  normal.  ____________________________________________   LABS (all labs ordered are listed, but only abnormal results are displayed)  Labs Reviewed - No data to display ____________________________________________  EKG   ____________________________________________  RADIOLOGY  ED MD interpretation: Degenerative changes and erosion to the distal phalanges of the third digit left hand.   ____________________________________________   PROCEDURES  Procedure(s) performed:   Procedures  Critical Care performed: No  ____________________________________________   INITIAL IMPRESSION / ASSESSMENT AND PLAN / ED COURSE  As part of my medical decision making, I reviewed the following data within the electronic MEDICAL RECORD NUMBER    Pain and edema to the third digit left hand secondary to degenerative changes.  Discussed x-ray findings with patient.  Patient given discharge care instructions and restarted on meloxicam.  Patient will follow with PCP for continued care.      ____________________________________________   FINAL CLINICAL IMPRESSION(S) / ED DIAGNOSES  Final diagnoses:  Osteoarthritis of finger of left hand     ED Discharge Orders        Ordered    meloxicam (MOBIC) 15 MG tablet  Daily     01/30/18 1229       Note:  This document was prepared using Dragon voice recognition software and may include unintentional dictation errors.    Joni Reining, PA-C 01/30/18 1235    Jeanmarie Plant, MD 01/30/18 1524

## 2019-10-30 ENCOUNTER — Emergency Department: Payer: Self-pay

## 2019-10-30 ENCOUNTER — Encounter: Payer: Self-pay | Admitting: Emergency Medicine

## 2019-10-30 ENCOUNTER — Other Ambulatory Visit: Payer: Self-pay

## 2019-10-30 ENCOUNTER — Emergency Department
Admission: EM | Admit: 2019-10-30 | Discharge: 2019-10-30 | Disposition: A | Discharge status: Home / Self Care | Payer: Self-pay | Attending: Emergency Medicine | Admitting: Emergency Medicine

## 2019-10-30 DIAGNOSIS — Z79899 Other long term (current) drug therapy: Secondary | ICD-10-CM | POA: Insufficient documentation

## 2019-10-30 DIAGNOSIS — M109 Gout, unspecified: Secondary | ICD-10-CM | POA: Insufficient documentation

## 2019-10-30 DIAGNOSIS — M79605 Pain in left leg: Secondary | ICD-10-CM

## 2019-10-30 DIAGNOSIS — E876 Hypokalemia: Secondary | ICD-10-CM | POA: Insufficient documentation

## 2019-10-30 DIAGNOSIS — R609 Edema, unspecified: Secondary | ICD-10-CM | POA: Insufficient documentation

## 2019-10-30 HISTORY — DX: Unspecified osteoarthritis, unspecified site: M19.90

## 2019-10-30 LAB — COMPREHENSIVE METABOLIC PANEL
ALT: 13 U/L (ref 0–44)
AST: 17 U/L (ref 15–41)
Albumin: 3.4 g/dL — ABNORMAL LOW (ref 3.5–5.0)
Alkaline Phosphatase: 64 U/L (ref 38–126)
Anion gap: 9 (ref 5–15)
BUN: 6 mg/dL (ref 6–20)
CO2: 24 mmol/L (ref 22–32)
Calcium: 8.8 mg/dL — ABNORMAL LOW (ref 8.9–10.3)
Chloride: 107 mmol/L (ref 98–111)
Creatinine, Ser: 0.87 mg/dL (ref 0.61–1.24)
GFR calc Af Amer: >60 mL/min (ref >60)
GFR calc non Af Amer: >60 mL/min (ref >60)
Glucose, Bld: 86 mg/dL (ref 70–99)
Potassium: 3.2 mmol/L — ABNORMAL LOW (ref 3.5–5.1)
Sodium: 140 mmol/L (ref 135–145)
Total Bilirubin: 0.6 mg/dL (ref 0.3–1.2)
Total Protein: 8.3 g/dL — ABNORMAL HIGH (ref 6.5–8.1)

## 2019-10-30 LAB — CBC
HCT: 34.8 % — ABNORMAL LOW (ref 39.0–52.0)
Hemoglobin: 11.4 g/dL — ABNORMAL LOW (ref 13.0–17.0)
MCH: 26.3 pg (ref 26.0–34.0)
MCHC: 32.8 g/dL (ref 30.0–36.0)
MCV: 80.4 fL (ref 80.0–100.0)
Platelets: 362 10*3/uL (ref 150–400)
RBC: 4.33 MIL/uL (ref 4.22–5.81)
RDW: 14.1 % (ref 11.5–15.5)
WBC: 6.4 10*3/uL (ref 4.0–10.5)
nRBC: 0 % (ref 0.0–0.2)

## 2019-10-30 MED ORDER — MELOXICAM 15 MG PO TABS: 15.0000 mg | ORAL_TABLET | Freq: Every day | ORAL | 0 refills | Status: DC

## 2019-10-30 MED ORDER — DICLOFENAC SODIUM 1 % TD GEL: 4.0000 g | Freq: Four times a day (QID) | TRANSDERMAL | 0 refills | Status: DC

## 2019-10-30 NOTE — ED Triage Notes (Addendum)
Pt reports intermittent foot swelling for 3 years  but today has increased left foot swelling more than normal; he reports normally the swelling rotates between the right and left foot.  Swelling noted to left foot extending into lower leg.  Some pain in ankle but no calf pain. No travel, nonsmoker.  No SHOB or CP.

## 2019-10-30 NOTE — ED Provider Notes (Signed)
[facility] Emergency Department Provider Note ____________________________________________   First MD Initiated Contact with Patient 10/30/19 1213     (approximate)  I have reviewed the triage vital signs and the nursing notes.   HISTORY  Chief Complaint Foot Swelling  HPI Steven Pham is a 58 y.o. male who presents to the emergency department for treatment and evaluation of left foot swelling. Today, he noticed that it is more swollen and more painful than usual. He denies chest pain or shortness of breath. He denies previous PE/DVT. He has taken ibuprofen without relief.      Past Medical History:  Diagnosis Date  . Arthritis     There are no active problems to display for this patient.   History reviewed. No pertinent surgical history.  Prior to Admission medications   Medication Sig Start Date End Date Taking? Authorizing Provider  amoxicillin (AMOXIL) 500 MG capsule Take 1 capsule (500 mg total) by mouth 3 (three) times daily. 08/27/17   Joni Reining, PA-C  diclofenac sodium (VOLTAREN) 1 % GEL Apply 4 g topically 4 (four) times daily. 10/30/19   Kalib Bhagat, Rulon Eisenmenger B, FNP  meloxicam (MOBIC) 15 MG tablet Take 1 tablet (15 mg total) by mouth daily. 10/30/19   Quatisha Zylka, Rulon Eisenmenger B, FNP  sulfamethoxazole-trimethoprim (BACTRIM DS,SEPTRA DS) 800-160 MG per tablet Take 2 tablets by mouth 2 (two) times daily. 05/12/15   Menshew, Charlesetta Ivory, PA-C  traMADol (ULTRAM) 50 MG tablet Take 1 tablet (50 mg total) by mouth every 6 (six) hours as needed. 05/07/15   Gehrig Patras, Rulon Eisenmenger B, FNP  traMADol (ULTRAM) 50 MG tablet Take 1 tablet (50 mg total) by mouth every 6 (six) hours as needed for moderate pain. 08/27/17   Joni Reining, PA-C    Allergies Patient has no known allergies.  History reviewed. No pertinent family history.  Social History Social History   Tobacco Use  . Smoking status: Never Smoker  . Smokeless tobacco: Never Used  Substance Use Topics  .  Alcohol use: No  . Drug use: No    Review of Systems  Constitutional: No fever/chills Eyes: No visual changes. ENT: No sore throat. Cardiovascular: Denies chest pain. Respiratory: Denies shortness of breath. Gastrointestinal: No abdominal pain.  No nausea, no vomiting.  No diarrhea.  No constipation. Genitourinary: Negative for dysuria. Musculoskeletal: Positive for left foot and ankle pain and swelling. Skin: Negative for rash. Neurological: Negative for headaches, focal weakness or numbness. ___________________________________________   PHYSICAL EXAM:  VITAL SIGNS: ED Triage Vitals  Enc Vitals Group     BP 10/30/19 1042 134/81     Pulse Rate 10/30/19 1042 81     Resp 10/30/19 1042 20     Temp 10/30/19 1042 97.7 F (36.5 C)     Temp Source 10/30/19 1042 Oral     SpO2 10/30/19 1042 99 %     Weight 10/30/19 1043 275 lb (124.7 kg)     Height 10/30/19 1043 5\' 2"  (1.575 m)     Head Circumference --      Peak Flow --      Pain Score 10/30/19 1042 6     Pain Loc --      Pain Edu? --      Excl. in GC? --     Constitutional: Alert and oriented. Well appearing and in no acute distress. Eyes: Conjunctivae are normal. PERRL. EOMI. Head: Atraumatic. Nose: No congestion/rhinnorhea. Mouth/Throat: Mucous membranes are moist.  Oropharynx non-erythematous. Neck: No stridor.  Hematological/Lymphatic/Immunilogical: No cervical lymphadenopathy. Cardiovascular: Normal rate, regular rhythm. Grossly normal heart sounds.  Good peripheral circulation. Respiratory: Normal respiratory effort.  No retractions. Lungs CTAB. Gastrointestinal: Soft and nontender. No distention. No abdominal bruits. No CVA tenderness. Genitourinary:  Musculoskeletal: Nonpitting edema of bilateral feet and ankles, left >right. 2+DP pulses. Neurologic:  Normal speech and language. No gross focal neurologic deficits are appreciated. No gait instability. Skin:  Skin is warm, dry and intact.No erythema noted over  the left foot/ankle/calf. No rash noted. Psychiatric: Mood and affect are normal. Speech and behavior are normal.  ____________________________________________   LABS (all labs ordered are listed, but only abnormal results are displayed)  Labs Reviewed  CBC - Abnormal; Notable for the following components:      Result Value   Hemoglobin 11.4 (*)    HCT 34.8 (*)    All other components within normal limits  COMPREHENSIVE METABOLIC PANEL - Abnormal; Notable for the following components:   Potassium 3.2 (*)    Calcium 8.8 (*)    Total Protein 8.3 (*)    Albumin 3.4 (*)    All other components within normal limits   ____________________________________________  EKG  Not indicated. ____________________________________________  RADIOLOGY  ED MD interpretation:    No DVT in the left lower extremity.  Official radiology report(s): US Venous Img Lower Unilateral Left  Result Date: 10/30/2019 CLINICAL DATA:  Left foot and ankle swelling and pain. EXAM: LEFT LOWER EXTREMITY VENOUS DOPPLER ULTRASOUND TECHNIQUE: Gray-scale sonography with graded compression, as well as color Doppler and duplex ultrasound were performed to evaluate the lower extremity deep venous systems from the level of the common femoral vein and including the common femoral, femoral, profunda femoral, popliteal and calf veins including the posterior tibial, peroneal and gastrocnemius veins when visible. The superficial great saphenous vein was also interrogated. Spectral Doppler was utilized to evaluate flow at rest and with distal augmentation maneuvers in the common femoral, femoral and popliteal veins. COMPARISON:  None. FINDINGS: Contralateral Common Femoral Vein: Respiratory phasicity is normal and symmetric with the symptomatic side. No evidence of thrombus. Normal compressibility. Common Femoral Vein: No evidence of thrombus. Normal compressibility, respiratory phasicity and response to augmentation. Saphenofemoral  Junction: No evidence of thrombus. Normal compressibility and flow on color Doppler imaging. Profunda Femoral Vein: No evidence of thrombus. Normal compressibility and flow on color Doppler imaging. Femoral Vein: No evidence of thrombus. Normal compressibility, respiratory phasicity and response to augmentation. Popliteal Vein: No evidence of thrombus. Normal compressibility, respiratory phasicity and response to augmentation. Calf Veins: No evidence of thrombus. Normal compressibility and flow on color Doppler imaging. Superficial Great Saphenous Vein: No evidence of thrombus. Normal compressibility. Venous Reflux:  None. Other Findings: No evidence of superficial thrombophlebitis or abnormal fluid collection. IMPRESSION: No evidence of left lower extremity deep venous thrombosis. Electronically Signed   By: Irish Lack M.D.   On: 10/30/2019 13:13    ____________________________________________   PROCEDURES  Procedure(s) performed (including Critical Care):  Procedures  ____________________________________________   INITIAL IMPRESSION / ASSESSMENT AND PLAN     58 year old male presenting to the emergency department for evaluation of the left foot and ankle swelling and pain.  See HPI for further details.  While awaiting room assignment, the patient had protocol labs drawn.  With the exception of a potassium of 3.2, labs are reassuring.  Plan will be to ensure that there is no DVT in the left lower extremity.  Ultrasound ordered.  DIFFERENTIAL DIAGNOSIS  Peripheral edema, DVT, hypokalemia  ED COURSE  Ultrasound of the left lower extremity is negative for acute findings per radiology.  Patient will be discharged home. He is to avoid hight purine diet, increase potassium in his diet, and elevate feet and legs often throughout the day.  He will be advised to schedule follow-up appointment with his primary care provider. ____________________________________________   FINAL CLINICAL  IMPRESSION(S) / ED DIAGNOSES  Final diagnoses:  Peripheral edema  Hypokalemia  Acute pain of left lower extremity  Acute gout of left ankle, unspecified cause     ED Discharge Orders         Ordered    diclofenac sodium (VOLTAREN) 1 % GEL  4 times daily     10/30/19 1347    meloxicam (MOBIC) 15 MG tablet  Daily     10/30/19 1347           Note:  This document was prepared using Dragon voice recognition software and may include unintentional dictation errors.   Victorino Dike, FNP 10/30/19 1352    Duffy Bruce, MD 10/31/19 2033

## 2020-12-30 DIAGNOSIS — I340 Nonrheumatic mitral (valve) insufficiency: Secondary | ICD-10-CM

## 2020-12-30 HISTORY — DX: Nonrheumatic mitral (valve) insufficiency: I34.0

## 2021-05-13 ENCOUNTER — Other Ambulatory Visit: Payer: Self-pay

## 2021-05-13 ENCOUNTER — Encounter: Payer: Self-pay | Admitting: Emergency Medicine

## 2021-05-13 ENCOUNTER — Emergency Department: Payer: BC Managed Care – PPO

## 2021-05-13 ENCOUNTER — Emergency Department
Admission: EM | Admit: 2021-05-13 | Discharge: 2021-05-13 | Disposition: A | Discharge status: Home / Self Care | Payer: BC Managed Care – PPO | Attending: Emergency Medicine | Admitting: Emergency Medicine

## 2021-05-13 DIAGNOSIS — E876 Hypokalemia: Secondary | ICD-10-CM | POA: Diagnosis not present

## 2021-05-13 DIAGNOSIS — R0602 Shortness of breath: Secondary | ICD-10-CM | POA: Diagnosis not present

## 2021-05-13 DIAGNOSIS — I517 Cardiomegaly: Secondary | ICD-10-CM | POA: Diagnosis not present

## 2021-05-13 DIAGNOSIS — I10 Essential (primary) hypertension: Secondary | ICD-10-CM | POA: Diagnosis not present

## 2021-05-13 DIAGNOSIS — J9811 Atelectasis: Secondary | ICD-10-CM | POA: Diagnosis not present

## 2021-05-13 HISTORY — DX: Gout, unspecified: M10.9

## 2021-05-13 HISTORY — DX: Essential (primary) hypertension: I10

## 2021-05-13 LAB — CBC
HCT: 35.7 % — ABNORMAL LOW (ref 39.0–52.0)
Hemoglobin: 11.9 g/dL — ABNORMAL LOW (ref 13.0–17.0)
MCH: 26 pg (ref 26.0–34.0)
MCHC: 33.3 g/dL (ref 30.0–36.0)
MCV: 77.9 fL — ABNORMAL LOW (ref 80.0–100.0)
Platelets: 346 10*3/uL (ref 150–400)
RBC: 4.58 MIL/uL (ref 4.22–5.81)
RDW: 15.7 % — ABNORMAL HIGH (ref 11.5–15.5)
WBC: 7.5 10*3/uL (ref 4.0–10.5)
nRBC: 0 % (ref 0.0–0.2)

## 2021-05-13 LAB — BASIC METABOLIC PANEL
Anion gap: 11 (ref 5–15)
BUN: 8 mg/dL (ref 6–20)
CO2: 23 mmol/L (ref 22–32)
Calcium: 8.6 mg/dL — ABNORMAL LOW (ref 8.9–10.3)
Chloride: 106 mmol/L (ref 98–111)
Creatinine, Ser: 0.9 mg/dL (ref 0.61–1.24)
GFR, Estimated: >60 mL/min (ref >60)
Glucose, Bld: 101 mg/dL — ABNORMAL HIGH (ref 70–99)
Potassium: 3.1 mmol/L — ABNORMAL LOW (ref 3.5–5.1)
Sodium: 140 mmol/L (ref 135–145)

## 2021-05-13 LAB — TROPONIN I (HIGH SENSITIVITY): Troponin I (High Sensitivity): 16 ng/L (ref <18)

## 2021-05-13 MED ORDER — FUROSEMIDE 20 MG PO TABS: 20.0000 mg | ORAL_TABLET | Freq: Every day | ORAL | 0 refills | Status: DC

## 2021-05-13 MED ORDER — POTASSIUM CHLORIDE CRYS ER 20 MEQ PO TBCR: 20.0000 meq | EXTENDED_RELEASE_TABLET | Freq: Every day | ORAL | 0 refills | Status: DC

## 2021-05-13 MED ORDER — POTASSIUM CHLORIDE CRYS ER 20 MEQ PO TBCR
40.0000 meq | EXTENDED_RELEASE_TABLET | Freq: Once | ORAL | Status: AC
  Administered 2021-05-13: 40 meq via ORAL
  Filled 2021-05-13: qty 2

## 2021-05-13 NOTE — ED Provider Notes (Signed)
[facility] Emergency Department Provider Note  ____________________________________________   Event Date/Time   First MD Initiated Contact with Patient 05/13/21 1540     (approximate)  I have reviewed the triage vital signs and the nursing notes.   HISTORY  Chief Complaint Shortness of Breath   HPI Larson Limones is a 60 y.o. male with a history of hypertension and gout presents to the emergency department for treatment and evaluation of several months of shortness of breath.  Shortness of breath worsens with exertion or lying flat.  Denies chest pain or cough.  No fever or sick exposure.   Past Medical History:  Diagnosis Date  . Arthritis   . Gout   . Hypertension     There are no problems to display for this patient.   History reviewed. No pertinent surgical history.  Prior to Admission medications   Medication Sig Start Date End Date Taking? Authorizing Provider  furosemide (LASIX) 20 MG tablet Take 1 tablet (20 mg total) by mouth daily. 05/13/21 05/13/22 Yes Jeanette Moffatt B, FNP  potassium chloride SA (KLOR-CON) 20 MEQ tablet Take 1 tablet (20 mEq total) by mouth daily. 05/13/21  Yes Elgene Coral B, FNP  amoxicillin (AMOXIL) 500 MG capsule Take 1 capsule (500 mg total) by mouth 3 (three) times daily. 08/27/17   Joni Reining, PA-C  diclofenac sodium (VOLTAREN) 1 % GEL Apply 4 g topically 4 (four) times daily. 10/30/19   Deval Mroczka, Rulon Eisenmenger B, FNP  meloxicam (MOBIC) 15 MG tablet Take 1 tablet (15 mg total) by mouth daily. 10/30/19   Alessia Gonsalez, Rulon Eisenmenger B, FNP  sulfamethoxazole-trimethoprim (BACTRIM DS,SEPTRA DS) 800-160 MG per tablet Take 2 tablets by mouth 2 (two) times daily. 05/12/15   Menshew, Charlesetta Ivory, PA-C  traMADol (ULTRAM) 50 MG tablet Take 1 tablet (50 mg total) by mouth every 6 (six) hours as needed. 05/07/15   Dallis Czaja, Rulon Eisenmenger B, FNP  traMADol (ULTRAM) 50 MG tablet Take 1 tablet (50 mg total) by mouth every 6 (six) hours as needed for moderate  pain. 08/27/17   Joni Reining, PA-C    Allergies Patient has no known allergies.  History reviewed. No pertinent family history.  Social History Social History   Tobacco Use  . Smoking status: Never Smoker  . Smokeless tobacco: Never Used  Substance Use Topics  . Alcohol use: No  . Drug use: No    Review of Systems  Constitutional: No fever/chills. Eyes: No visual changes. ENT: No sore throat. Cardiovascular: Negative for chest pain.  Negative for pleuritic pain.  Negative for palpitations.  Negative for leg pain. Respiratory: Positive shortness of breath. Gastrointestinal: Negative abdominal pain.  No nausea, no vomiting.  No diarrhea.  No constipation. Genitourinary: Negative for dysuria. Musculoskeletal: Negative for back pain.  Skin: Negative for rash, lesion, wound. Neurological: Negative for headaches, focal weakness or numbness. ____________________________________________   PHYSICAL EXAM:  VITAL SIGNS: ED Triage Vitals  Enc Vitals Group     BP 05/13/21 1449 97/72     Pulse Rate 05/13/21 1449 (!) 52     Resp 05/13/21 1449 20     Temp 05/13/21 1452 97.9 F (36.6 C)     Temp Source 05/13/21 1452 Oral     SpO2 05/13/21 1449 95 %     Weight 05/13/21 1449 264 lb (119.7 kg)     Height 05/13/21 1449 5\' 2"  (1.575 m)     Head Circumference --      Peak Flow --  Pain Score 05/13/21 1449 0     Pain Loc --      Pain Edu? --      Excl. in GC? --     Constitutional: Alert and oriented.  Well appearing and in no acute distress.  Normal mental status. Eyes: Conjunctivae are normal. PERRL. Head: Atraumatic. Nose: No congestion/rhinnorhea. Mouth/Throat: Mucous membranes are moist.  Oropharynx non-erythematous. Tongue normal in size and color. Neck: No stridor.  No carotid bruit appreciated on exam. Hematological/Lymphatic/Immunilogical: No cervical lymphadenopathy. Cardiovascular: Normal rate, regular rhythm. Grossly normal heart sounds.  Good peripheral  circulation. Respiratory: Normal respiratory effort.  No retractions. Lungs CTAB. Gastrointestinal: Soft and nontender. No distention. No abdominal bruits. No CVA tenderness. Genitourinary: Exam deferred. Musculoskeletal: No lower extremity tenderness.  No edema of extremities. Neurologic:  Normal speech and language. No gross focal neurologic deficits are appreciated. Skin:  Skin is warm, dry and intact. No rash noted. Psychiatric: Mood and affect are normal. Speech and behavior are normal.  ____________________________________________   LABS (all labs ordered are listed, but only abnormal results are displayed)  Labs Reviewed  BASIC METABOLIC PANEL - Abnormal; Notable for the following components:      Result Value   Potassium 3.1 (*)    Glucose, Bld 101 (*)    Calcium 8.6 (*)    All other components within normal limits  CBC - Abnormal; Notable for the following components:   Hemoglobin 11.9 (*)    HCT 35.7 (*)    MCV 77.9 (*)    RDW 15.7 (*)    All other components within normal limits  TROPONIN I (HIGH SENSITIVITY)   ____________________________________________  EKG  ED ECG REPORT I, Ace Bergfeld, FNP-BC personally viewed and interpreted this ECG.   Date: 05/13/2021  EKG Time: 1453  Rate: 99  Rhythm: Sinus rhythm with occasional PVCs  Axis: Normal  Intervals:none  ST&T Change: No ST elevation  ____________________________________________  RADIOLOGY  ED MD interpretation: Chest x-ray shows cardiomegaly with pulmonary vascular congestion.  No opacities.  I, Kem Boroughs, personally viewed and evaluated these images (plain radiographs) as part of my medical decision making, as well as reviewing the written report by the radiologist.  Official radiology report(s): No results found.  ____________________________________________   PROCEDURES  Procedure(s) performed: None  Procedures  Critical Care performed:  No  ____________________________________________   INITIAL IMPRESSION / ASSESSMENT AND PLAN   60 year old male presenting with to the emergency department for several months of shortness of breath.  Symptoms get worse with exertion and lying flat.  1 pillow orthopnea.  No peripheral edema.  Will review protocol labs and chest x-ray.  As part of my medical decision making, I reviewed the following data within the electronic MEDICAL RECORD NUMBER    ____________________________________________  Differential diagnosis includes, but not limited to:  Differential diagnosis includes, but is not limited to, ACS, aortic dissection, pulmonary embolism, cardiac tamponade,pneumonia, pericarditis, myocarditis, GI-related causes including esophagitis/gastritis, and musculoskeletal chest wall pain.     ED COURSE  Patient mildly hypokalemic.  He was given 55 M EQ of potassium.  Chest x-ray does show concern concern for cardiomegaly and pulmonary vascular congestion.  Will be treated outpatient with Lasix.  We discussed dietary changes and he is aware that he needs to decrease his sodium intake and increase his potassium intake.  He will receive a prescription for 1 week of potassium and is to call and schedule a follow-up appointment with his primary care provider.  He will also  be given a referral to cardiology and was encouraged to schedule an appointment as soon as possible.  He was discharged home with strict ER return precautions.     FINAL CLINICAL IMPRESSION(S) / ED DIAGNOSES  Final diagnoses:  Shortness of breath  Cardiomegaly  Hypokalemia  Hypertension, unspecified type     ED Discharge Orders         Ordered    furosemide (LASIX) 20 MG tablet  Daily        05/13/21 1646    potassium chloride SA (KLOR-CON) 20 MEQ tablet  Daily        05/13/21 1646           Ahad Rasnic was evaluated in Emergency Department on 05/15/2021 for the symptoms described in the history of present illness.  He was evaluated in the context of the global COVID-19 pandemic, which necessitated consideration that the patient might be at risk for infection with the SARS-CoV-2 virus that causes COVID-19. Institutional protocols and algorithms that pertain to the evaluation of patients at risk for COVID-19 are in a state of rapid change based on information released by regulatory bodies including the CDC and federal and state organizations. These policies and algorithms were followed during the patient's care in the ED.   Note:  This document was prepared using Dragon voice recognition software and may include unintentional dictation errors.   Chinita Pester, FNP 05/15/21 1751    Jene Every, MD 05/17/21 [phone]

## 2021-05-13 NOTE — ED Notes (Signed)
E-signature unavailable, computer in room frozen. Pt signed paper copy and verbalized understanding of discharge instructions.

## 2021-05-13 NOTE — ED Triage Notes (Signed)
Pt via POV from home. Pt c/o SOB for couple months. Pt states it is worse laying down or when he is exerting himself. Denies hx of CHF. Denies CP. Denies cough. Pt is A&Ox4 and NAD.

## 2021-05-13 NOTE — ED Notes (Signed)
This RN ambulated patient on pulse oximetry. Pt denies shortness of breath with ambulation, O2 saturation remained between 97-98% on room air with ambulation.

## 2021-05-13 NOTE — Discharge Instructions (Addendum)
Call and make an appointment with cardiology.  Avoid fried and salty foods.  Eat foods that are high in potassium.

## 2021-06-06 DIAGNOSIS — M2550 Pain in unspecified joint: Secondary | ICD-10-CM | POA: Diagnosis not present

## 2021-06-08 DIAGNOSIS — I10 Essential (primary) hypertension: Secondary | ICD-10-CM | POA: Diagnosis not present

## 2021-08-16 DIAGNOSIS — Z0189 Encounter for other specified special examinations: Secondary | ICD-10-CM | POA: Diagnosis not present

## 2021-08-16 DIAGNOSIS — I10 Essential (primary) hypertension: Secondary | ICD-10-CM | POA: Diagnosis not present

## 2021-08-17 ENCOUNTER — Emergency Department
Admission: EM | Admit: 2021-08-17 | Discharge: 2021-08-17 | Disposition: A | Discharge status: Home / Self Care | Payer: BC Managed Care – PPO | Attending: Emergency Medicine | Admitting: Emergency Medicine

## 2021-08-17 ENCOUNTER — Emergency Department: Payer: BC Managed Care – PPO

## 2021-08-17 ENCOUNTER — Other Ambulatory Visit: Payer: Self-pay

## 2021-08-17 DIAGNOSIS — X58XXXA Exposure to other specified factors, initial encounter: Secondary | ICD-10-CM | POA: Diagnosis not present

## 2021-08-17 DIAGNOSIS — M25531 Pain in right wrist: Secondary | ICD-10-CM | POA: Diagnosis not present

## 2021-08-17 DIAGNOSIS — S63501A Unspecified sprain of right wrist, initial encounter: Secondary | ICD-10-CM | POA: Diagnosis not present

## 2021-08-17 DIAGNOSIS — Z79899 Other long term (current) drug therapy: Secondary | ICD-10-CM | POA: Diagnosis not present

## 2021-08-17 DIAGNOSIS — S6991XA Unspecified injury of right wrist, hand and finger(s), initial encounter: Secondary | ICD-10-CM | POA: Diagnosis not present

## 2021-08-17 DIAGNOSIS — M7989 Other specified soft tissue disorders: Secondary | ICD-10-CM | POA: Diagnosis not present

## 2021-08-17 DIAGNOSIS — I10 Essential (primary) hypertension: Secondary | ICD-10-CM | POA: Diagnosis not present

## 2021-08-17 DIAGNOSIS — Y990 Civilian activity done for income or pay: Secondary | ICD-10-CM | POA: Diagnosis not present

## 2021-08-17 MED ORDER — PREDNISONE 10 MG PO TABS: 10.0000 mg | ORAL_TABLET | Freq: Every day | ORAL | 0 refills | Status: DC

## 2021-08-17 NOTE — Discharge Instructions (Addendum)
Please continue with wrist brace.  Apply ice to the wrist 20 minutes every hour.  Wear brace at all times except for showering.  Take medication as prescribed.  Return for any warmth redness or fevers.

## 2021-08-17 NOTE — ED Provider Notes (Signed)
[facility] EMERGENCY DEPARTMENT Provider Note   CSN: [id number] Arrival date & time: 08/17/21  1458     History Chief Complaint  Patient presents with   Wrist Pain    Steven Pham is a 60 y.o. male presents to the emerge department evaluation of right wrist pain.  Patient states he has been having pain at work.  He performs a lot of repetitive range of motion.  No trauma or injury.  Today he went and purchased a Velcro wrist brace, get some relief of the brace.  Has some swelling over the DRUJ no catching clicking or locking.  No numbness or tingling.  No fevers.  HPI     Past Medical History:  Diagnosis Date   Arthritis    Gout    Hypertension     There are no problems to display for this patient.   History reviewed. No pertinent surgical history.     No family history on file.  Social History   Tobacco Use   Smoking status: Never   Smokeless tobacco: Never  Substance Use Topics   Alcohol use: No   Drug use: No    Home Medications Prior to Admission medications   Medication Sig Start Date End Date Taking? Authorizing Provider  predniSONE (DELTASONE) 10 MG tablet Take 1 tablet (10 mg total) by mouth daily. 6,5,4,3,2,1 six day taper 08/17/21  Yes Evon Slack, PA-C  amoxicillin (AMOXIL) 500 MG capsule Take 1 capsule (500 mg total) by mouth 3 (three) times daily. 08/27/17   Joni Reining, PA-C  diclofenac sodium (VOLTAREN) 1 % GEL Apply 4 g topically 4 (four) times daily. 10/30/19   Triplett, Rulon Eisenmenger B, FNP  furosemide (LASIX) 20 MG tablet Take 1 tablet (20 mg total) by mouth daily. 05/13/21 05/13/22  Triplett, Rulon Eisenmenger B, FNP  meloxicam (MOBIC) 15 MG tablet Take 1 tablet (15 mg total) by mouth daily. 10/30/19   Triplett, Cari B, FNP  potassium chloride SA (KLOR-CON) 20 MEQ tablet Take 1 tablet (20 mEq total) by mouth daily. 05/13/21   Triplett, Rulon Eisenmenger B, FNP  sulfamethoxazole-trimethoprim (BACTRIM DS,SEPTRA DS) 800-160 MG per tablet Take 2 tablets  by mouth 2 (two) times daily. 05/12/15   Menshew, Charlesetta Ivory, PA-C  traMADol (ULTRAM) 50 MG tablet Take 1 tablet (50 mg total) by mouth every 6 (six) hours as needed. 05/07/15   Triplett, Rulon Eisenmenger B, FNP  traMADol (ULTRAM) 50 MG tablet Take 1 tablet (50 mg total) by mouth every 6 (six) hours as needed for moderate pain. 08/27/17   Joni Reining, PA-C    Allergies    Patient has no known allergies.  Review of Systems   Review of Systems  Constitutional:  Negative for fever.  Respiratory:  Negative for shortness of breath.   Cardiovascular:  Negative for chest pain.  Gastrointestinal:  Negative for abdominal pain.  Genitourinary:  Negative for difficulty urinating, dysuria and urgency.  Musculoskeletal:  Positive for arthralgias. Negative for back pain, gait problem, joint swelling and myalgias.  Skin:  Negative for rash.  Neurological:  Negative for dizziness and headaches.   Physical Exam Updated Vital Signs BP (!) 142/104 (BP Location: Left Arm)   Pulse 89   Temp 98.7 F (37.1 C) (Oral)   SpO2 97%   Physical Exam Constitutional:      Appearance: He is well-developed.  HENT:     Head: Normocephalic and atraumatic.  Eyes:     Conjunctiva/sclera: Conjunctivae normal.  Cardiovascular:  Rate and Rhythm: Normal rate.  Pulmonary:     Effort: Pulmonary effort is normal. No respiratory distress.  Musculoskeletal:        General: Normal range of motion.     Cervical back: Normal range of motion.     Comments: Right wrist with normal range of motion.  Mild swelling along the DRUJ with no warmth or redness.  Has some pain with DRUJ compression but with no laxity.  Normal pronation supination flexion and extension.  No signs of wrist effusion.  Sensation is intact distally.  Full composite fist.  Skin:    General: Skin is warm.     Findings: No rash.  Neurological:     Mental Status: He is alert and oriented to person, place, and time.  Psychiatric:        Behavior: Behavior  normal.        Thought Content: Thought content normal.    ED Results / Procedures / Treatments   Labs (all labs ordered are listed, but only abnormal results are displayed) Labs Reviewed - No data to display  EKG None  Radiology DG Wrist Complete Right  Result Date: 08/17/2021 CLINICAL DATA:  Right wrist pain, swelling EXAM: RIGHT WRIST - COMPLETE 3+ VIEW COMPARISON:  None. FINDINGS: There is no evidence of fracture or dislocation. No cortical erosion or destruction. Degenerative changes of the first carpometacarpal joint. No aggressive appearing focal bone abnormality. Subcutaneus soft tissue edema. IMPRESSION: No acute displaced fracture or dislocation. Electronically Signed   By: Tish Frederickson M.D.   On: 08/17/2021 17:08    Procedures Procedures   Medications Ordered in ED Medications - No data to display  ED Course  I have reviewed the triage vital signs and the nursing notes.  Pertinent labs & imaging results that were available during my care of the patient were reviewed by me and considered in my medical decision making (see chart for details).    MDM Rules/Calculators/A&P                         60 year old male with acute right wrist pain with no trauma or injury.  Does not appear to have any type of septic joint or gouty arthropathy.  X-rays negative for any acute bony abnormality or arthropathy.  He will wear Velcro wrist brace and he is placed on a prednisone taper.  He is given information on resting icing and wrist range of motion exercises.  We will follow-up with PCP or orthopedics in 1 week if no improvement. Final Clinical Impression(s) / ED Diagnoses Final diagnoses:  Sprain of right wrist, initial encounter    Rx / DC Orders ED Discharge Orders          Ordered    predniSONE (DELTASONE) 10 MG tablet  Daily        08/17/21 1653             Ronnette Juniper 08/17/21 1718    Gilles Chiquito, MD 08/17/21 1911

## 2021-08-17 NOTE — ED Triage Notes (Signed)
Pt comes with c/o right wrist pain. Pt states he injured it at work. Pt has brace on.

## 2021-11-29 DIAGNOSIS — I4729 Other ventricular tachycardia: Secondary | ICD-10-CM

## 2021-11-29 DIAGNOSIS — I471 Supraventricular tachycardia, unspecified: Secondary | ICD-10-CM

## 2021-11-29 HISTORY — DX: Other ventricular tachycardia: I47.29

## 2021-11-29 HISTORY — DX: Supraventricular tachycardia, unspecified: I47.10

## 2021-11-29 HISTORY — DX: Supraventricular tachycardia: I47.1

## 2021-12-14 ENCOUNTER — Encounter: Payer: Self-pay | Admitting: Emergency Medicine

## 2021-12-14 ENCOUNTER — Emergency Department: Payer: BC Managed Care – PPO

## 2021-12-14 ENCOUNTER — Inpatient Hospital Stay
Admission: EM | Admit: 2021-12-14 | Discharge: 2021-12-19 | DRG: 872 | Disposition: A | Discharge status: Home / Self Care | Payer: BC Managed Care – PPO | Attending: Internal Medicine | Admitting: Internal Medicine

## 2021-12-14 ENCOUNTER — Other Ambulatory Visit: Payer: Self-pay

## 2021-12-14 DIAGNOSIS — M1990 Unspecified osteoarthritis, unspecified site: Secondary | ICD-10-CM | POA: Diagnosis not present

## 2021-12-14 DIAGNOSIS — I248 Other forms of acute ischemic heart disease: Secondary | ICD-10-CM | POA: Diagnosis present

## 2021-12-14 DIAGNOSIS — I4729 Other ventricular tachycardia: Secondary | ICD-10-CM | POA: Diagnosis not present

## 2021-12-14 DIAGNOSIS — I5042 Chronic combined systolic (congestive) and diastolic (congestive) heart failure: Secondary | ICD-10-CM | POA: Diagnosis present

## 2021-12-14 DIAGNOSIS — E876 Hypokalemia: Secondary | ICD-10-CM | POA: Diagnosis present

## 2021-12-14 DIAGNOSIS — R778 Other specified abnormalities of plasma proteins: Secondary | ICD-10-CM | POA: Diagnosis present

## 2021-12-14 DIAGNOSIS — I5022 Chronic systolic (congestive) heart failure: Secondary | ICD-10-CM | POA: Diagnosis not present

## 2021-12-14 DIAGNOSIS — I471 Supraventricular tachycardia: Secondary | ICD-10-CM | POA: Diagnosis present

## 2021-12-14 DIAGNOSIS — E860 Dehydration: Secondary | ICD-10-CM | POA: Diagnosis present

## 2021-12-14 DIAGNOSIS — I472 Ventricular tachycardia, unspecified: Secondary | ICD-10-CM | POA: Diagnosis not present

## 2021-12-14 DIAGNOSIS — D72819 Decreased white blood cell count, unspecified: Secondary | ICD-10-CM | POA: Diagnosis not present

## 2021-12-14 DIAGNOSIS — K5700 Diverticulitis of small intestine with perforation and abscess without bleeding: Secondary | ICD-10-CM

## 2021-12-14 DIAGNOSIS — I5041 Acute combined systolic (congestive) and diastolic (congestive) heart failure: Secondary | ICD-10-CM | POA: Diagnosis present

## 2021-12-14 DIAGNOSIS — I110 Hypertensive heart disease with heart failure: Secondary | ICD-10-CM | POA: Diagnosis present

## 2021-12-14 DIAGNOSIS — R9431 Abnormal electrocardiogram [ECG] [EKG]: Secondary | ICD-10-CM | POA: Diagnosis not present

## 2021-12-14 DIAGNOSIS — K5712 Diverticulitis of small intestine without perforation or abscess without bleeding: Secondary | ICD-10-CM | POA: Diagnosis present

## 2021-12-14 DIAGNOSIS — Z136 Encounter for screening for cardiovascular disorders: Secondary | ICD-10-CM | POA: Diagnosis not present

## 2021-12-14 DIAGNOSIS — Z79899 Other long term (current) drug therapy: Secondary | ICD-10-CM

## 2021-12-14 DIAGNOSIS — I493 Ventricular premature depolarization: Secondary | ICD-10-CM | POA: Diagnosis not present

## 2021-12-14 DIAGNOSIS — A419 Sepsis, unspecified organism: Secondary | ICD-10-CM | POA: Diagnosis not present

## 2021-12-14 DIAGNOSIS — R55 Syncope and collapse: Secondary | ICD-10-CM

## 2021-12-14 DIAGNOSIS — E871 Hypo-osmolality and hyponatremia: Secondary | ICD-10-CM | POA: Diagnosis present

## 2021-12-14 DIAGNOSIS — D729 Disorder of white blood cells, unspecified: Secondary | ICD-10-CM

## 2021-12-14 DIAGNOSIS — R1110 Vomiting, unspecified: Secondary | ICD-10-CM | POA: Diagnosis not present

## 2021-12-14 DIAGNOSIS — M109 Gout, unspecified: Secondary | ICD-10-CM | POA: Diagnosis present

## 2021-12-14 DIAGNOSIS — R7989 Other specified abnormal findings of blood chemistry: Secondary | ICD-10-CM | POA: Diagnosis present

## 2021-12-14 DIAGNOSIS — R7401 Elevation of levels of liver transaminase levels: Secondary | ICD-10-CM | POA: Diagnosis present

## 2021-12-14 DIAGNOSIS — Z791 Long term (current) use of non-steroidal anti-inflammatories (NSAID): Secondary | ICD-10-CM

## 2021-12-14 DIAGNOSIS — Z20822 Contact with and (suspected) exposure to covid-19: Secondary | ICD-10-CM | POA: Diagnosis present

## 2021-12-14 DIAGNOSIS — N179 Acute kidney failure, unspecified: Secondary | ICD-10-CM | POA: Diagnosis not present

## 2021-12-14 DIAGNOSIS — R718 Other abnormality of red blood cells: Secondary | ICD-10-CM | POA: Diagnosis present

## 2021-12-14 DIAGNOSIS — D72829 Elevated white blood cell count, unspecified: Secondary | ICD-10-CM | POA: Diagnosis not present

## 2021-12-14 DIAGNOSIS — Z833 Family history of diabetes mellitus: Secondary | ICD-10-CM

## 2021-12-14 DIAGNOSIS — J9811 Atelectasis: Secondary | ICD-10-CM | POA: Diagnosis not present

## 2021-12-14 DIAGNOSIS — R109 Unspecified abdominal pain: Secondary | ICD-10-CM | POA: Diagnosis not present

## 2021-12-14 DIAGNOSIS — I420 Dilated cardiomyopathy: Secondary | ICD-10-CM | POA: Diagnosis not present

## 2021-12-14 DIAGNOSIS — Z8249 Family history of ischemic heart disease and other diseases of the circulatory system: Secondary | ICD-10-CM | POA: Diagnosis not present

## 2021-12-14 DIAGNOSIS — R1900 Intra-abdominal and pelvic swelling, mass and lump, unspecified site: Secondary | ICD-10-CM | POA: Diagnosis not present

## 2021-12-14 DIAGNOSIS — I10 Essential (primary) hypertension: Secondary | ICD-10-CM | POA: Diagnosis not present

## 2021-12-14 DIAGNOSIS — R42 Dizziness and giddiness: Secondary | ICD-10-CM

## 2021-12-14 DIAGNOSIS — B999 Unspecified infectious disease: Secondary | ICD-10-CM

## 2021-12-14 DIAGNOSIS — R000 Tachycardia, unspecified: Secondary | ICD-10-CM | POA: Diagnosis not present

## 2021-12-14 HISTORY — DX: Cardiomyopathy, unspecified: I42.9

## 2021-12-14 LAB — LIPASE, BLOOD: Lipase: 56 U/L — ABNORMAL HIGH (ref 11–51)

## 2021-12-14 LAB — COMPREHENSIVE METABOLIC PANEL
ALT: 32 U/L (ref 0–44)
AST: 74 U/L — ABNORMAL HIGH (ref 15–41)
Albumin: 3.3 g/dL — ABNORMAL LOW (ref 3.5–5.0)
Alkaline Phosphatase: 119 U/L (ref 38–126)
Anion gap: 13 (ref 5–15)
BUN: 22 mg/dL — ABNORMAL HIGH (ref 6–20)
CO2: 19 mmol/L — ABNORMAL LOW (ref 22–32)
Calcium: 8.1 mg/dL — ABNORMAL LOW (ref 8.9–10.3)
Chloride: 100 mmol/L (ref 98–111)
Creatinine, Ser: 1.85 mg/dL — ABNORMAL HIGH (ref 0.61–1.24)
GFR, Estimated: 41 mL/min — ABNORMAL LOW (ref >60)
Glucose, Bld: 190 mg/dL — ABNORMAL HIGH (ref 70–99)
Potassium: 2.8 mmol/L — ABNORMAL LOW (ref 3.5–5.1)
Sodium: 132 mmol/L — ABNORMAL LOW (ref 135–145)
Total Bilirubin: 2.3 mg/dL — ABNORMAL HIGH (ref 0.3–1.2)
Total Protein: 8.4 g/dL — ABNORMAL HIGH (ref 6.5–8.1)

## 2021-12-14 LAB — CBC WITH DIFFERENTIAL/PLATELET
Abs Immature Granulocytes: 0.08 10*3/uL — ABNORMAL HIGH (ref 0.00–0.07)
Basophils Absolute: 0 10*3/uL (ref 0.0–0.1)
Basophils Relative: 1 %
Eosinophils Absolute: 0 10*3/uL (ref 0.0–0.5)
Eosinophils Relative: 0 %
HCT: 38.4 % — ABNORMAL LOW (ref 39.0–52.0)
Hemoglobin: 13.1 g/dL (ref 13.0–17.0)
Immature Granulocytes: 3 %
Lymphocytes Relative: 16 %
Lymphs Abs: 0.4 10*3/uL — ABNORMAL LOW (ref 0.7–4.0)
MCH: 25.7 pg — ABNORMAL LOW (ref 26.0–34.0)
MCHC: 34.1 g/dL (ref 30.0–36.0)
MCV: 75.3 fL — ABNORMAL LOW (ref 80.0–100.0)
Monocytes Absolute: 0.2 10*3/uL (ref 0.1–1.0)
Monocytes Relative: 6 %
Neutro Abs: 1.9 10*3/uL (ref 1.7–7.7)
Neutrophils Relative %: 74 %
Platelets: 151 10*3/uL (ref 150–400)
RBC: 5.1 MIL/uL (ref 4.22–5.81)
RDW: 14.6 % (ref 11.5–15.5)
Smear Review: NORMAL
WBC: 2.6 10*3/uL — ABNORMAL LOW (ref 4.0–10.5)
nRBC: 0 % (ref 0.0–0.2)

## 2021-12-14 LAB — CBG MONITORING, ED: Glucose-Capillary: 203 mg/dL — ABNORMAL HIGH (ref 70–99)

## 2021-12-14 LAB — RESP PANEL BY RT-PCR (FLU A&B, COVID) ARPGX2
Influenza A by PCR: NEGATIVE
Influenza B by PCR: NEGATIVE
SARS Coronavirus 2 by RT PCR: NEGATIVE

## 2021-12-14 LAB — TROPONIN I (HIGH SENSITIVITY)
Troponin I (High Sensitivity): 60 ng/L — ABNORMAL HIGH (ref <18)
Troponin I (High Sensitivity): 81 ng/L — ABNORMAL HIGH (ref <18)

## 2021-12-14 MED ORDER — PIPERACILLIN-TAZOBACTAM 3.375 G IVPB 30 MIN
3.3750 g | Freq: Once | INTRAVENOUS | Status: AC
  Administered 2021-12-14: 3.375 g via INTRAVENOUS
  Filled 2021-12-14: qty 50

## 2021-12-14 MED ORDER — ACETAMINOPHEN 650 MG RE SUPP
650.0000 mg | Freq: Four times a day (QID) | RECTAL | Status: DC | PRN
  Filled 2021-12-14: qty 1

## 2021-12-14 MED ORDER — ONDANSETRON HCL 4 MG/2ML IJ SOLN: 4.0000 mg | Freq: Four times a day (QID) | INTRAMUSCULAR | Status: DC | PRN

## 2021-12-14 MED ORDER — ENOXAPARIN SODIUM 60 MG/0.6ML IJ SOSY
0.5000 mg/kg | PREFILLED_SYRINGE | INTRAMUSCULAR | Status: DC
  Administered 2021-12-15 – 2021-12-19 (×5): 57.5 mg via SUBCUTANEOUS
  Filled 2021-12-14 (×5): qty 0.6

## 2021-12-14 MED ORDER — SODIUM CHLORIDE 0.9 % IV SOLN
2.0000 g | INTRAVENOUS | Status: DC
  Administered 2021-12-15 – 2021-12-18 (×4): 2 g via INTRAVENOUS
  Filled 2021-12-14 (×2): qty 2
  Filled 2021-12-14 (×2): qty 20

## 2021-12-14 MED ORDER — ONDANSETRON HCL 4 MG PO TABS: 4.0000 mg | ORAL_TABLET | Freq: Four times a day (QID) | ORAL | Status: DC | PRN

## 2021-12-14 MED ORDER — METRONIDAZOLE 500 MG/100ML IV SOLN
500.0000 mg | Freq: Two times a day (BID) | INTRAVENOUS | Status: DC
  Administered 2021-12-15 – 2021-12-18 (×7): 500 mg via INTRAVENOUS
  Filled 2021-12-14 (×7): qty 100

## 2021-12-14 MED ORDER — MORPHINE SULFATE (PF) 2 MG/ML IV SOLN
2.0000 mg | INTRAVENOUS | Status: DC | PRN
  Administered 2021-12-15: 2 mg via INTRAVENOUS
  Filled 2021-12-14: qty 1

## 2021-12-14 MED ORDER — TRAZODONE HCL 50 MG PO TABS: 25.0000 mg | ORAL_TABLET | Freq: Every evening | ORAL | Status: DC | PRN

## 2021-12-14 MED ORDER — METRONIDAZOLE 500 MG/100ML IV SOLN: 500.0000 mg | Freq: Two times a day (BID) | INTRAVENOUS | Status: DC

## 2021-12-14 MED ORDER — ACETAMINOPHEN 325 MG PO TABS
650.0000 mg | ORAL_TABLET | Freq: Four times a day (QID) | ORAL | Status: DC | PRN
  Administered 2021-12-16: 04:00:00 650 mg via ORAL
  Filled 2021-12-14: qty 2

## 2021-12-14 MED ORDER — MAGNESIUM HYDROXIDE 400 MG/5ML PO SUSP: 30.0000 mL | Freq: Every day | ORAL | Status: DC | PRN

## 2021-12-14 MED ORDER — METRONIDAZOLE 500 MG/100ML IV SOLN: 500.0000 mg | Freq: Once | INTRAVENOUS | Status: DC

## 2021-12-14 MED ORDER — POTASSIUM CHLORIDE IN NACL 20-0.9 MEQ/L-% IV SOLN
INTRAVENOUS | Status: DC
  Filled 2021-12-14 (×7): qty 1000

## 2021-12-14 MED ORDER — IOHEXOL 300 MG/ML  SOLN
75.0000 mL | Freq: Once | INTRAMUSCULAR | Status: AC | PRN
  Administered 2021-12-14: 75 mL via INTRAVENOUS

## 2021-12-14 NOTE — ED Notes (Signed)
Called for pt multiple times, no answer..

## 2021-12-14 NOTE — ED Triage Notes (Signed)
Pt in with initial complaint of upper abdominal pain, sharp and intermittent since Thursday, "after he ate a pizza". States he has had one episode of emesis since. When this RN called for pt in lobby, he was standing and near syncopal, very diaphoretic. Remained sweaty throughout triage. Denies any cp, cough or sob, just c/o abdominal pain and weakness. CBG 200's stable

## 2021-12-14 NOTE — Progress Notes (Signed)
PHARMACIST - PHYSICIAN COMMUNICATION  CONCERNING:  Enoxaparin (Lovenox) for DVT Prophylaxis    RECOMMENDATION: Patient was prescribed enoxaprin 40mg  q24 hours for VTE prophylaxis.   Filed Weights   12/14/21 2021  Weight: 116.1 kg (256 lb)    Body mass index is 46.82 kg/m.  Estimated Creatinine Clearance: 47.6 mL/min (A) (by C-G formula based on SCr of 1.85 mg/dL (H)).   Based on [facility] policy patient is candidate for enoxaparin 0.5mg /kg TBW SQ every 24 hours based on BMI being >30.   DESCRIPTION: Pharmacy has adjusted enoxaparin dose per [facility] policy.  Patient is now receiving enoxaparin 0.5 mg/kg every 24 hours    [facility], PharmD, [facility] 12/14/2021 11:28 PM

## 2021-12-14 NOTE — ED Provider Notes (Signed)
[facility] Emergency Department Provider Note   ____________________________________________   Event Date/Time   First MD Initiated Contact with Patient 12/14/21 2057     (approximate)  I have reviewed the triage vital signs and the nursing notes.   HISTORY  Chief Complaint Abdominal Pain and Weakness    HPI Steven Pham is a 60 y.o. male who reports he has not been feeling well for about a week.  He has been having some upper abdominal pain which is sharp and intermittent since Thursday.  Today, after work he had pain and he went to get a pizza and the pain got much worse.  The pain is in the upper abdomen right upper quadrant epigastric area and somewhat over to the left.  Pain is much better now.  He was having very severe pain that was very sweaty.  Patient reports he has intermittent episodes of rapid heart rate.  Is been going on for about a week 2.  Heart rate on EKG on arrival was 131. Pain was severe.  It lasted for well over an hour while he was here.  It is now much better but not gone.  Seem to be made worse by the food.  Is also made worse by palpation now.  Patient still somewhat nauseated he vomited once in triage.      Past Medical History:  Diagnosis Date   Arthritis    Gout    Hypertension     Patient Active Problem List   Diagnosis Date Noted   Diverticulitis of small bowel 12/14/2021    History reviewed. No pertinent surgical history.  Prior to Admission medications   Medication Sig Start Date End Date Taking? Authorizing Provider  hydrochlorothiazide (HYDRODIURIL) 12.5 MG tablet Take 12.5 mg by mouth daily. 11/09/21  Yes [provider]  meloxicam (MOBIC) 15 MG tablet Take 1 tablet (15 mg total) by mouth daily. 10/30/19  Yes Triplett, Cari B, FNP  furosemide (LASIX) 20 MG tablet Take 1 tablet (20 mg total) by mouth daily. Patient not taking: Reported on 12/14/2021 05/13/21 05/13/22  Kem Boroughs B, FNP  potassium  chloride SA (KLOR-CON) 20 MEQ tablet Take 1 tablet (20 mEq total) by mouth daily. Patient not taking: Reported on 12/14/2021 05/13/21   Chinita Pester, FNP    Allergies Patient has no known allergies.  No family history on file.  Social History Social History   Tobacco Use   Smoking status: Never   Smokeless tobacco: Never  Substance Use Topics   Alcohol use: No   Drug use: No    Review of Systems  Constitutional: No fever/chills Eyes: No visual changes. ENT: No sore throat. Cardiovascular: Denies chest pain. Respiratory: Some cough and mild shortness of breath. Gastrointestinal: abdominal pain.   nausea,  vomiting.  No diarrhea.  No constipation. Genitourinary: Negative for dysuria. Musculoskeletal: Negative for back pain. Skin: Negative for rash. Neurological: Negative for headaches, focal weakness   ____________________________________________   PHYSICAL EXAM:  VITAL SIGNS: ED Triage Vitals [12/14/21 2021]  Enc Vitals Group     BP 140/72     Pulse Rate (!) 132     Resp 20     Temp 98.2 F (36.8 C)     Temp Source Oral     SpO2 99 %     Weight 256 lb (116.1 kg)     Height      Head Circumference      Peak Flow  Pain Score      Pain Loc      Pain Edu?      Excl. in GC?     Constitutional: Alert and oriented.  Somewhat uncomfortable appearing but now in no acute distress. Eyes: Conjunctivae are normal.  Head: Atraumatic. Nose: No congestion/rhinnorhea. Mouth/Throat: Mucous membranes are moist.  Oropharynx non-erythematous. Neck: No stridor.  Cardiovascular: Irregular rate, irregular rhythm due to PVCs on the monitor. Grossly normal heart sounds.  Good peripheral circulation. Respiratory: Normal respiratory effort.  No retractions. Lungs CTAB. Gastrointestinal: Soft tender to palpation but not percussion in the upper abdomen only.  No distention. No abdominal bruits.  Musculoskeletal: No lower extremity tenderness nor edema.  Neurologic:  Normal  speech and language. No gross focal neurologic deficits are appreciated. Skin:  Skin is warm, dry and intact. No rash noted.  Patient was very sweaty but is not now.   ____________________________________________   LABS (all labs ordered are listed, but only abnormal results are displayed)  Labs Reviewed  COMPREHENSIVE METABOLIC PANEL - Abnormal; Notable for the following components:      Result Value   Sodium 132 (*)    Potassium 2.8 (*)    CO2 19 (*)    Glucose, Bld 190 (*)    BUN 22 (*)    Creatinine, Ser 1.85 (*)    Calcium 8.1 (*)    Total Protein 8.4 (*)    Albumin 3.3 (*)    AST 74 (*)    Total Bilirubin 2.3 (*)    GFR, Estimated 41 (*)    All other components within normal limits  LIPASE, BLOOD - Abnormal; Notable for the following components:   Lipase 56 (*)    All other components within normal limits  CBC WITH DIFFERENTIAL/PLATELET - Abnormal; Notable for the following components:   WBC 2.6 (*)    HCT 38.4 (*)    MCV 75.3 (*)    MCH 25.7 (*)    Lymphs Abs 0.4 (*)    Abs Immature Granulocytes 0.08 (*)    All other components within normal limits  CBG MONITORING, ED - Abnormal; Notable for the following components:   Glucose-Capillary 203 (*)    All other components within normal limits  TROPONIN I (HIGH SENSITIVITY) - Abnormal; Notable for the following components:   Troponin I (High Sensitivity) 60 (*)    All other components within normal limits  RESP PANEL BY RT-PCR (FLU A&B, COVID) [id number]  URINALYSIS, ROUTINE W REFLEX MICROSCOPIC  TROPONIN I (HIGH SENSITIVITY)   ____________________________________________  EKG  EKG read interpreted by me shows sinus tachycardia at 131 left axis flipped T's in 1 and L flattening of T waves V3 through 6.  He has a long QT.  This is worse than EKG from 522 but all of the above changes were present at least in a small amount. ____________________________________________  RADIOLOGY I, Arnaldo Natal, personally viewed  and evaluated these images (plain radiographs) as part of my medical decision making, as well as reviewing the written report by the radiologist.  ED MD interpretation: Radiology reads chest x-ray is very mild bibasilar atelectasis.  I reviewed the films and agree CT read by radiology with reading below.  I reviewed the films and agree completely. Official radiology report(s): DG Chest 1 View  Result Date: 12/14/2021 CLINICAL DATA:  Upper abdominal pain and vomiting. EXAM: CHEST  1 VIEW COMPARISON:  May 13, 2021 FINDINGS: Very mild, stable atelectasis is seen within the bilateral  lung bases. There is no evidence of a pleural effusion or pneumothorax. The heart size and mediastinal contours are within normal limits. The visualized skeletal structures are unremarkable. IMPRESSION: Very mild bibasilar atelectasis. Electronically Signed   By: Aram Candela M.D.   On: 12/14/2021 20:34   CT ABDOMEN PELVIS W CONTRAST  Result Date: 12/14/2021 CLINICAL DATA:  Upper abdominal pain for 2 days EXAM: CT ABDOMEN AND PELVIS WITH CONTRAST TECHNIQUE: Multidetector CT imaging of the abdomen and pelvis was performed using the standard protocol following bolus administration of intravenous contrast. CONTRAST:  29mL OMNIPAQUE IOHEXOL 300 MG/ML  SOLN COMPARISON:  None. FINDINGS: Lower chest: No acute abnormality. Hepatobiliary: No focal liver abnormality is seen. No gallstones, gallbladder wall thickening, or biliary dilatation. Pancreas: Unremarkable. No pancreatic ductal dilatation or surrounding inflammatory changes. Spleen: Normal in size without focal abnormality. Adrenals/Urinary Tract: Adrenal glands are within normal limits. Kidneys demonstrate a normal enhancement pattern bilaterally. Large simple cyst is noted arising from the lower pole of the left kidney measuring 5 cm in greatest dimension. No obstructive changes are seen. The ureters are within normal limits. The bladder is partially distended.  Stomach/Bowel: No obstructive or inflammatory changes of colon are seen. The appendix is within normal limits. Stomach is within normal limits. There are multiple fluid-filled outpouchings in the mid jejunum consistent with small bowel diverticular change. Some associated inflammatory changes are noted in the adjacent mesentery with findings suggestive of early venous air related to the inflammatory change. No definitive portal venous air is noted. Vascular/Lymphatic: Atherosclerotic calcifications are seen. Some inflammatory changes are noted in the root of the small bowel mesentery in the left mid abdomen as described above. Some linear areas of air are noted in the inflamed mesentery and the possibility of early SMV air could not be totally excluded. Reproductive: Prostate is unremarkable. Other: Small fat containing umbilical hernia is noted. No abdominopelvic ascites. Musculoskeletal: No acute or significant osseous findings. IMPRESSION: Changes consistent with small bowel diverticulitis with inflammatory changes in the mesentery in the left mid abdomen as well as some very early venous air adjacent to the small bowel loops. Left renal cyst. Critical Value/emergent results were called by telephone at the time of interpretation on 12/14/2021 at 10:34 pm to Dr. Dorothea Glassman , who verbally acknowledged these results. Electronically Signed   By: Alcide Clever M.D.   On: 12/14/2021 22:38    ____________________________________________   PROCEDURES  Procedure(s) performed (including Critical Care): Critical care time 30 minutes.  This involves reviewing the patient's findings examining the patient twice and discussing him with the surgeon and then the hospitalist.  Additionally I spoke with the radiologist about the patient's CT.  Procedures   ____________________________________________   INITIAL IMPRESSION / ASSESSMENT AND PLAN / ED COURSE    ----------------------------------------- 10:47 PM on  12/14/2021 ----------------------------------------- Patient with small bowel diverticulitis in the lobby he looked like he was severely ill here in the hospital he does not know and is not severely tender however his CT scan is very worrisome and his white count of 2.6 implies a severe infection.  I will give him Zosyn and Flagyl.  I have spoken with the surgeon just now and I am awaiting the hospitalist to call me back.          ____________________________________________   FINAL CLINICAL IMPRESSION(S) / ED DIAGNOSES  Final diagnoses:  Intra-abdominal infection  Abnormal white blood cell (WBC) count     ED Discharge Orders     None  Note:  This document was prepared using Dragon voice recognition software and may include unintentional dictation errors.    Arnaldo Natal, MD 12/14/21 [phone]

## 2021-12-14 NOTE — Progress Notes (Signed)
Update Note  Called by Dr. Darnelle Catalan in the ED regarding patient who presents with abdominal pain. I have personally reviewed the patient's chart and CT abdomen and pelvis with radiology.  CT demonstrates small bowel diverticulitis with inflammatory changes of the mesentery and small amount of mesenteric venous gas.  No pneumoperitoneum noted on imaging. Recommend NPO and antibiotics. We will attempt conservative management at this time.  I will plan to see the patient first thing in the morning. Please call me if the patient's clinical status or abdominal exam worsens.  Steven Goodin, DO

## 2021-12-14 NOTE — H&P (Signed)
[place]   PATIENT NAME: Steven Pham    MR#:  [id number]  DATE OF BIRTH:  08-Jul-1961  DATE OF ADMISSION:  12/14/2021  PRIMARY CARE PHYSICIAN: Ellen Henri, MD   Patient is coming from: Home  REQUESTING/REFERRING PHYSICIAN: Dorothea Glassman, MD  CHIEF COMPLAINT:   Chief Complaint  Patient presents with   Abdominal Pain   Weakness    HISTORY OF PRESENT ILLNESS:  Steven Pham is a 60 y.o. African-American male with medical history significant for osteoarthritis, gout and hypertension, who presented to the emergency room with acute onset of severe abdominal pain which has been going on over the last couple of days.  She experienced presyncope today.  He admits to nausea and vomiting as well as chills without measured fever.  No chest pain or dyspnea or cough.  No dysuria, oliguria or hematuria or flank pain.  ED Course: Upon presentation to the ER heart rate was 132 and vital signs otherwise were within normal.  Labs revealedHypokalemia and hyponatremia and CO2 of 19 with a blood glucose of 190 BUN of 22 with a creatinine of 1.85 and calcium 8.1.  Albumin was 3.3 with to  Total bili was 2.3.  High-sensitivity troponin I was 60 and later 81, total protein of 8.4.  Lipase was 56 a with microcytosis.  Influenza antigens and COVID-19 PCR came back negative.nd AST 74 with ALT of 32.CBC showed leukopenia 2.6 EKG as reviewed by me : EKG showed sinus tachycardia with rate of 131 with left axis deviation and LVHWith repolarization abnormality. Imaging: Abdominal pelvic CT scan showed changes consistent with small bowel diverticulitis with inflammatory changes in the mesentery in the left mid abdomen as well as some very early venous air adjacent to the small bowel loops.  It showed left renal cyst.  General surgery was contacted about the patient and Dr. Wyatt Mage.  The patient was given IV Zosyn and will be admitted to a medical telemetry bed for further evaluation and  management. PAST MEDICAL HISTORY:   Past Medical History:  Diagnosis Date   Arthritis    Gout    Hypertension     PAST SURGICAL HISTORY:  History reviewed. No pertinent surgical history.  He denies any previous surgeries  SOCIAL HISTORY:   Social History   Tobacco Use   Smoking status: Never   Smokeless tobacco: Never  Substance Use Topics   Alcohol use: No    FAMILY HISTORY:   Positive for diabetes mellitus and hypertension in his mother. DRUG ALLERGIES:  No Known Allergies  REVIEW OF SYSTEMS:   ROS As per history of present illness. All pertinent systems were reviewed above. Constitutional, HEENT, cardiovascular, respiratory, GI, GU, musculoskeletal, neuro, psychiatric, endocrine, integumentary and hematologic systems were reviewed and are otherwise negative/unremarkable except for positive findings mentioned above in the HPI.   MEDICATIONS AT HOME:   Prior to Admission medications   Medication Sig Start Date End Date Taking? Authorizing Provider  hydrochlorothiazide (HYDRODIURIL) 12.5 MG tablet Take 12.5 mg by mouth daily. 11/09/21  Yes [provider]  meloxicam (MOBIC) 15 MG tablet Take 1 tablet (15 mg total) by mouth daily. 10/30/19  Yes Triplett, Cari B, FNP  furosemide (LASIX) 20 MG tablet Take 1 tablet (20 mg total) by mouth daily. Patient not taking: Reported on 12/14/2021 05/13/21 05/13/22  Kem Boroughs B, FNP  potassium chloride SA (KLOR-CON) 20 MEQ tablet Take 1 tablet (20 mEq total) by mouth daily. Patient not taking: Reported on  12/14/2021 05/13/21   Triplett, Johnette Abraham B, FNP      VITAL SIGNS:  Blood pressure (!) 125/92, pulse 78, temperature 98.2 F (36.8 C), temperature source Oral, resp. rate 20, height 5\' 2"  (1.575 m), weight 116.1 kg, SpO2 94 %.  PHYSICAL EXAMINATION:  Physical Exam  GENERAL:  60 y.o.-year-old African-American male patient lying in the bed with no acute distress.  EYES: Pupils equal, round, reactive to light and  accommodation. No scleral icterus. Extraocular muscles intact.  HEENT: Head atraumatic, normocephalic. Oropharynx and nasopharynx clear.  NECK:  Supple, no jugular venous distention. No thyroid enlargement, no tenderness.  LUNGS: Normal breath sounds bilaterally, no wheezing, rales,rhonchi or crepitation. No use of accessory muscles of respiration.  CARDIOVASCULAR: Regular rate and rhythm, S1, S2 normal. No murmurs, rubs, or gallops.  ABDOMEN: Soft, mildly distended and diffusely tenderness with diminished bowel sounds.  No organomegaly or mass.  EXTREMITIES: No pedal edema, cyanosis, or clubbing.  NEUROLOGIC: Cranial nerves II through XII are intact. Muscle strength 5/5 in all extremities. Sensation intact. Gait not checked.  PSYCHIATRIC: The patient is alert and oriented x 3.  Normal affect and good eye contact. SKIN: No obvious rash, lesion, or ulcer.   LABORATORY PANEL:   CBC Recent Labs  Lab 12/14/21 2010  WBC 2.6*  HGB 13.1  HCT 38.4*  PLT 151   ------------------------------------------------------------------------------------------------------------------  Chemistries  Recent Labs  Lab 12/14/21 2010  NA 132*  K 2.8*  CL 100  CO2 19*  GLUCOSE 190*  BUN 22*  CREATININE 1.85*  CALCIUM 8.1*  AST 74*  ALT 32  ALKPHOS 119  BILITOT 2.3*   ------------------------------------------------------------------------------------------------------------------  Cardiac Enzymes No results for input(s): TROPONINI in the last 168 hours. ------------------------------------------------------------------------------------------------------------------  RADIOLOGY:  DG Chest 1 View  Result Date: 12/14/2021 CLINICAL DATA:  Upper abdominal pain and vomiting. EXAM: CHEST  1 VIEW COMPARISON:  May 13, 2021 FINDINGS: Very mild, stable atelectasis is seen within the bilateral lung bases. There is no evidence of a pleural effusion or pneumothorax. The heart size and mediastinal contours  are within normal limits. The visualized skeletal structures are unremarkable. IMPRESSION: Very mild bibasilar atelectasis. Electronically Signed   By: Virgina Norfolk M.D.   On: 12/14/2021 20:34   CT ABDOMEN PELVIS W CONTRAST  Result Date: 12/14/2021 CLINICAL DATA:  Upper abdominal pain for 2 days EXAM: CT ABDOMEN AND PELVIS WITH CONTRAST TECHNIQUE: Multidetector CT imaging of the abdomen and pelvis was performed using the standard protocol following bolus administration of intravenous contrast. CONTRAST:  69mL OMNIPAQUE IOHEXOL 300 MG/ML  SOLN COMPARISON:  None. FINDINGS: Lower chest: No acute abnormality. Hepatobiliary: No focal liver abnormality is seen. No gallstones, gallbladder wall thickening, or biliary dilatation. Pancreas: Unremarkable. No pancreatic ductal dilatation or surrounding inflammatory changes. Spleen: Normal in size without focal abnormality. Adrenals/Urinary Tract: Adrenal glands are within normal limits. Kidneys demonstrate a normal enhancement pattern bilaterally. Large simple cyst is noted arising from the lower pole of the left kidney measuring 5 cm in greatest dimension. No obstructive changes are seen. The ureters are within normal limits. The bladder is partially distended. Stomach/Bowel: No obstructive or inflammatory changes of colon are seen. The appendix is within normal limits. Stomach is within normal limits. There are multiple fluid-filled outpouchings in the mid jejunum consistent with small bowel diverticular change. Some associated inflammatory changes are noted in the adjacent mesentery with findings suggestive of early venous air related to the inflammatory change. No definitive portal venous air is noted. Vascular/Lymphatic: Atherosclerotic calcifications  are seen. Some inflammatory changes are noted in the root of the small bowel mesentery in the left mid abdomen as described above. Some linear areas of air are noted in the inflamed mesentery and the possibility of  early SMV air could not be totally excluded. Reproductive: Prostate is unremarkable. Other: Small fat containing umbilical hernia is noted. No abdominopelvic ascites. Musculoskeletal: No acute or significant osseous findings. IMPRESSION: Changes consistent with small bowel diverticulitis with inflammatory changes in the mesentery in the left mid abdomen as well as some very early venous air adjacent to the small bowel loops. Left renal cyst. Critical Value/emergent results were called by telephone at the time of interpretation on 12/14/2021 at 10:34 pm to Dr. Conni Slipper , who verbally acknowledged these results. Electronically Signed   By: Inez Catalina M.D.   On: 12/14/2021 22:38      IMPRESSION AND PLAN:  Principal Problem:   Diverticulitis of small bowel  1.  Acute small bowel diverticulitis with possible early perforation. - The patient will be admitted to a medical-surgical telemetry bed. - We will keep him n.p.o and obtain a general surgery consultation. - Dr. Derryl Harbor was notified and is aware about patient. - Pain management will be provided. - We will repeat two-view abdomen x-ray in a.m. - We will continue the patient on IV Rocephin and Flagyl.  2.  Near syncope, likely secondary to #1. - He will be monitored for arrhythmias. - We will optimize his electrolytes.  3.  Hypokalemia. - Potassium will be replaced and magnesium level will be checked.  4.  Essential hypertension. - We will continue amlodipine and hold off HCTZ.  DVT prophylaxis: Lovenox. Code Status: full code. Family Communication:  The plan of care was discussed in details with the patient (and family). I answered all questions. The patient agreed to proceed with the above mentioned plan. Further management will depend upon hospital course. Disposition Plan: Back to previous home environment Consults called: General surgery all the records are reviewed and case discussed with ED provider.  Status is:  Inpatient   Remains inpatient appropriate because:Ongoing diagnostic testing needed not appropriate for outpatient work up, Unsafe d/c plan, IV treatments appropriate due to intensity of illness or inability to take PO, and Inpatient level of care appropriate due to severity of illness   Dispo: The patient is from: Home              Anticipated d/c is to: Home              Patient currently is not medically stable to d/c.              Difficult to place patient: No     Christel Mormon M.D on 12/14/2021 at 11:37 PM  [facility]   From 7 PM-7 AM, contact night-coverage [email or web address]  CC: Primary care physician; Lianne Bushy, MD

## 2021-12-15 ENCOUNTER — Other Ambulatory Visit: Payer: Self-pay

## 2021-12-15 DIAGNOSIS — K5712 Diverticulitis of small intestine without perforation or abscess without bleeding: Secondary | ICD-10-CM

## 2021-12-15 LAB — COMPREHENSIVE METABOLIC PANEL
ALT: 36 U/L (ref 0–44)
AST: 69 U/L — ABNORMAL HIGH (ref 15–41)
Albumin: 2.8 g/dL — ABNORMAL LOW (ref 3.5–5.0)
Alkaline Phosphatase: 106 U/L (ref 38–126)
Anion gap: 11 (ref 5–15)
BUN: 22 mg/dL — ABNORMAL HIGH (ref 6–20)
CO2: 21 mmol/L — ABNORMAL LOW (ref 22–32)
Calcium: 7.3 mg/dL — ABNORMAL LOW (ref 8.9–10.3)
Chloride: 108 mmol/L (ref 98–111)
Creatinine, Ser: 1.6 mg/dL — ABNORMAL HIGH (ref 0.61–1.24)
GFR, Estimated: 49 mL/min — ABNORMAL LOW (ref >60)
Glucose, Bld: 95 mg/dL (ref 70–99)
Potassium: 3.1 mmol/L — ABNORMAL LOW (ref 3.5–5.1)
Sodium: 140 mmol/L (ref 135–145)
Total Bilirubin: 1.5 mg/dL — ABNORMAL HIGH (ref 0.3–1.2)
Total Protein: 7.1 g/dL (ref 6.5–8.1)

## 2021-12-15 LAB — URINALYSIS, ROUTINE W REFLEX MICROSCOPIC
Bacteria, UA: NONE SEEN
Bilirubin Urine: NEGATIVE
Glucose, UA: NEGATIVE mg/dL
Ketones, ur: NEGATIVE mg/dL
Leukocytes,Ua: NEGATIVE
Nitrite: NEGATIVE
Protein, ur: 30 mg/dL — AB
Specific Gravity, Urine: 1.029 (ref 1.005–1.030)
pH: 5 (ref 5.0–8.0)

## 2021-12-15 LAB — HIV ANTIBODY (ROUTINE TESTING W REFLEX): HIV Screen 4th Generation wRfx: NONREACTIVE

## 2021-12-15 LAB — PROCALCITONIN: Procalcitonin: >150 ng/mL

## 2021-12-15 LAB — CBC
HCT: 35.9 % — ABNORMAL LOW (ref 39.0–52.0)
Hemoglobin: 12.5 g/dL — ABNORMAL LOW (ref 13.0–17.0)
MCH: 26 pg (ref 26.0–34.0)
MCHC: 34.8 g/dL (ref 30.0–36.0)
MCV: 74.8 fL — ABNORMAL LOW (ref 80.0–100.0)
Platelets: 171 10*3/uL (ref 150–400)
RBC: 4.8 MIL/uL (ref 4.22–5.81)
RDW: 14.7 % (ref 11.5–15.5)
WBC: 12.5 10*3/uL — ABNORMAL HIGH (ref 4.0–10.5)
nRBC: 0 % (ref 0.0–0.2)

## 2021-12-15 LAB — BASIC METABOLIC PANEL
Anion gap: 11 (ref 5–15)
BUN: 24 mg/dL — ABNORMAL HIGH (ref 6–20)
CO2: 24 mmol/L (ref 22–32)
Calcium: 7.9 mg/dL — ABNORMAL LOW (ref 8.9–10.3)
Chloride: 104 mmol/L (ref 98–111)
Creatinine, Ser: 1.94 mg/dL — ABNORMAL HIGH (ref 0.61–1.24)
GFR, Estimated: 39 mL/min — ABNORMAL LOW (ref >60)
Glucose, Bld: 124 mg/dL — ABNORMAL HIGH (ref 70–99)
Potassium: 2.7 mmol/L — CL (ref 3.5–5.1)
Sodium: 139 mmol/L (ref 135–145)

## 2021-12-15 LAB — CORTISOL-AM, BLOOD: Cortisol - AM: 35.8 ug/dL — ABNORMAL HIGH (ref 6.7–22.6)

## 2021-12-15 LAB — LACTIC ACID, PLASMA
Lactic Acid, Venous: 1.3 mmol/L (ref 0.5–1.9)
Lactic Acid, Venous: 1.5 mmol/L (ref 0.5–1.9)

## 2021-12-15 LAB — PROTIME-INR
INR: 1.3 — ABNORMAL HIGH (ref 0.8–1.2)
Prothrombin Time: 16.1 seconds — ABNORMAL HIGH (ref 11.4–15.2)

## 2021-12-15 LAB — TROPONIN I (HIGH SENSITIVITY): Troponin I (High Sensitivity): 113 ng/L (ref <18)

## 2021-12-15 LAB — MAGNESIUM: Magnesium: 1.6 mg/dL — ABNORMAL LOW (ref 1.7–2.4)

## 2021-12-15 LAB — TSH: TSH: 0.822 u[IU]/mL (ref 0.350–4.500)

## 2021-12-15 MED ORDER — METOPROLOL TARTRATE 5 MG/5ML IV SOLN: 5.0000 mg | Freq: Once | INTRAVENOUS | Status: DC

## 2021-12-15 MED ORDER — POTASSIUM CHLORIDE 20 MEQ PO PACK
40.0000 meq | PACK | Freq: Once | ORAL | Status: AC
  Administered 2021-12-15: 40 meq via ORAL
  Filled 2021-12-15: qty 2

## 2021-12-15 MED ORDER — POTASSIUM CHLORIDE 10 MEQ/100ML IV SOLN
10.0000 meq | INTRAVENOUS | Status: DC
  Administered 2021-12-15: 10 meq via INTRAVENOUS
  Filled 2021-12-15 (×3): qty 100

## 2021-12-15 MED ORDER — METOPROLOL TARTRATE 5 MG/5ML IV SOLN
INTRAVENOUS | Status: AC
  Administered 2021-12-15: 5 mg
  Filled 2021-12-15: qty 5

## 2021-12-15 MED ORDER — MAGNESIUM SULFATE 2 GM/50ML IV SOLN
2.0000 g | Freq: Once | INTRAVENOUS | Status: AC
  Administered 2021-12-15: 2 g via INTRAVENOUS
  Filled 2021-12-15: qty 50

## 2021-12-15 MED ORDER — SODIUM CHLORIDE 0.9 % IV BOLUS
250.0000 mL | Freq: Once | INTRAVENOUS | Status: AC
  Administered 2021-12-15: 250 mL via INTRAVENOUS

## 2021-12-15 MED ORDER — DIGOXIN 0.25 MG/ML IJ SOLN
0.2500 mg | Freq: Once | INTRAMUSCULAR | Status: DC
  Filled 2021-12-15: qty 2

## 2021-12-15 MED ORDER — POTASSIUM CHLORIDE CRYS ER 20 MEQ PO TBCR
40.0000 meq | EXTENDED_RELEASE_TABLET | Freq: Three times a day (TID) | ORAL | Status: AC
  Administered 2021-12-15 – 2021-12-16 (×4): 40 meq via ORAL
  Filled 2021-12-15 (×4): qty 2

## 2021-12-15 MED ORDER — SODIUM CHLORIDE 0.9 % IV BOLUS
2000.0000 mL | Freq: Once | INTRAVENOUS | Status: AC
  Administered 2021-12-15: 2000 mL via INTRAVENOUS

## 2021-12-15 NOTE — ED Notes (Signed)
Pt resting in bed, Tv turned on, lights turned down for patient to rest

## 2021-12-15 NOTE — ED Notes (Signed)
Called lab to collect lab work ordered.

## 2021-12-15 NOTE — Consult Note (Signed)
[facility] SURGICAL CONSULTATION NOTE    HISTORY OF PRESENT ILLNESS (HPI):  60 y.o. male presented to [facility] ED overnight for evaluation of mid abdominal pain. Patient reports that his abdominal pain started on Wednesday.  He was hopeful that the pain would improve, however it continued to worsen over the last 2 days, and he presented to the ED last night.  He confirms nausea with episode of emesis prior to hospitalization, however he does state that he has not had any more episodes of emesis since arrival.  He describes his pain as a aching constant pain in his mid abdomen.  He has never experienced anything like this before.  He confirms passing flatus today, and his last bowel movement was yesterday, and this was normal for him.  He has no history of abdominal surgeries.  His past medical history is significant for hypertension and gout.  He denies use of any blood thinning medications.  While in the emergency department, the patient was noted to be in A. fib with RVR.  He spontaneously converted back to normal sinus rhythm, however he continues to convert back and forth between A. fib and normal sinus rhythm.  Surgery is consulted by Dr. Cinda Quest in this context for evaluation and management of abdominal pain and small bowel diverticulitis.  PAST MEDICAL HISTORY (PMH):  Past Medical History:  Diagnosis Date   Arthritis    Gout    Hypertension      PAST SURGICAL HISTORY ([place]):  History reviewed. No pertinent surgical history.   MEDICATIONS:  Prior to Admission medications   Medication Sig Start Date End Date Taking? Authorizing Provider  hydrochlorothiazide (HYDRODIURIL) 12.5 MG tablet Take 12.5 mg by mouth daily. 11/09/21  Yes [provider]  meloxicam (MOBIC) 15 MG tablet Take 1 tablet (15 mg total) by mouth daily. 10/30/19  Yes Triplett, Cari B, FNP  furosemide (LASIX) 20 MG tablet Take 1 tablet (20 mg total) by mouth daily. Patient not taking: Reported on  12/14/2021 05/13/21 05/13/22  Sherrie George B, FNP  potassium chloride SA (KLOR-CON) 20 MEQ tablet Take 1 tablet (20 mEq total) by mouth daily. Patient not taking: Reported on 12/14/2021 05/13/21   Victorino Dike, FNP     ALLERGIES:  No Known Allergies   SOCIAL HISTORY:  Social History   Socioeconomic History   Marital status: Single    Spouse name: Not on file   Number of children: Not on file   Years of education: Not on file   Highest education level: Not on file  Occupational History   Not on file  Tobacco Use   Smoking status: Never   Smokeless tobacco: Never  Substance and Sexual Activity   Alcohol use: No   Drug use: No   Sexual activity: Not on file  Other Topics Concern   Not on file  Social History Narrative   Not on file   Social Determinants of Health   Financial Resource Strain: Not on file  Food Insecurity: Not on file  Transportation Needs: Not on file  Physical Activity: Not on file  Stress: Not on file  Social Connections: Not on file  Intimate Partner Violence: Not on file     FAMILY HISTORY:  No family history on file.    REVIEW OF SYSTEMS:  Review of Systems  Constitutional:  Negative for chills and fever.  Respiratory:  Negative for shortness of breath and wheezing.   Cardiovascular:  Negative for chest pain and palpitations.  Gastrointestinal:  Positive for abdominal pain, nausea and vomiting.  Genitourinary: Negative.  Negative for dysuria and urgency.  Skin:  Negative for itching and rash.  Neurological:  Negative for dizziness and headaches.  Psychiatric/Behavioral:  Negative for depression.    VITAL SIGNS:  Temp:  [98.2 F (36.8 C)] 98.2 F (36.8 C) (12/16 2021) Pulse Rate:  [38-165] 50 (12/17 0930) Resp:  [18-37] 22 (12/17 0930) BP: (85-140)/(52-92) 89/68 (12/17 0930) SpO2:  [91 %-99 %] 94 % (12/17 0930) Weight:  [116.1 kg] 116.1 kg (12/16 2021)     Height: 5\' 2"  (157.5 cm) Weight: 116.1 kg     INTAKE/OUTPUT:  12/16 0701 -  12/17 0700 In: 350 [IV Piggyback:350] Out: -   PHYSICAL EXAM:  Physical Exam Constitutional:      General: He is not in acute distress.    Appearance: He is well-developed. He is not ill-appearing.  HENT:     Head: Normocephalic and atraumatic.  Eyes:     Extraocular Movements: Extraocular movements intact.     Pupils: Pupils are equal, round, and reactive to light.  Cardiovascular:     Rate and Rhythm: Normal rate and regular rhythm.  Pulmonary:     Breath sounds: Normal breath sounds.  Abdominal:     Comments: Abdomen soft, nondistended, no percussion tenderness, minimal tenderness to palpation in mid abdomen, no rigidity, guarding, or rebound tenderness  Skin:    General: Skin is warm and dry.  Neurological:     Mental Status: He is alert.     Labs:  CBC Latest Ref Rng & Units 12/15/2021 12/14/2021 05/13/2021  WBC 4.0 - 10.5 K/uL 12.5(H) 2.6(L) 7.5  Hemoglobin 13.0 - 17.0 g/dL 12.5(L) 13.1 11.9(L)  Hematocrit 39.0 - 52.0 % 35.9(L) 38.4(L) 35.7(L)  Platelets 150 - 400 K/uL 171 151 346   CMP Latest Ref Rng & Units 12/15/2021 12/14/2021 05/13/2021  Glucose 70 - 99 mg/dL 05/15/2021) 948(N) 462(V)  BUN 6 - 20 mg/dL 035(K) 09(F) 8  Creatinine 0.61 - 1.24 mg/dL 81(W) 2.99(B) 7.16(R  Sodium 135 - 145 mmol/L 139 132(L) 140  Potassium 3.5 - 5.1 mmol/L 2.7(LL) 2.8(L) 3.1(L)  Chloride 98 - 111 mmol/L 104 100 106  CO2 22 - 32 mmol/L 24 19(L) 23  Calcium 8.9 - 10.3 mg/dL 7.9(L) 8.1(L) 8.6(L)  Total Protein 6.5 - 8.1 g/dL - 8.4(H) -  Total Bilirubin 0.3 - 1.2 mg/dL - 2.3(H) -  Alkaline Phos 38 - 126 U/L - 119 -  AST 15 - 41 U/L - 74(H) -  ALT 0 - 44 U/L - 32 -    Imaging studies:  CT ABDOMEN AND PELVIS WITH CONTRAST IMPRESSION: Changes consistent with small bowel diverticulitis with inflammatory changes in the mesentery in the left mid abdomen as well as some very early venous air adjacent to the small bowel loops.   Left renal cyst.  Assessment/Plan:  60 y.o. male with  abdominal pain and small bowel diverticulitis, complicated by pertinent comorbidities including HTN and AKI with Cr 1.94.  WBC 12.5   -Imaging thoroughly evaluated with radiology.  CT demonstrates area of small bowel diverticulitis in the left mid abdomen.  There is some venous gas noted within the mesentery.  There is no pneumoperitoneum present  -Given patient's benign abdominal exam, will recommend conservative management at this time  -Continue antibiotics, Rocephin and Flagyl  -NPO  -IVF - NS at 100cc/hr  -Pain control  -We will continue to monitor patient's clinical status and abdominal exam, if concern  for significantly worsening abdominal exam, patient may require surgical intervention  -Care per primary team  All of the above findings and recommendations were discussed with the patient, and all of patient's questions were answered to his expressed satisfaction.  Thank you for the opportunity to participate in this patient's care.   -- Graciella Freer, DO

## 2021-12-15 NOTE — ED Notes (Signed)
Attending at bedside at this time.

## 2021-12-15 NOTE — Progress Notes (Signed)
PROGRESS NOTE    Steven Pham  VOH:[id number] DOB: 03-13-1961 DOA: 12/14/2021 PCP: Ellen Henri, MD    Brief Narrative:  60 year old gentleman with history of osteoarthritis, gout and hypertension on thiazide and Lasix presented to emergency room with acute onset of left-sided abdominal pain, dizziness and lightheadedness with some nausea and vomiting and chills. In the emergency room tachycardic, severely hypokalemic and hyponatremic, AKI.  COVID-19 and influenza negative.  Leukopenic.  CT scan abdomen with small bowel diverticulitis with some inflammatory changes.  Surgery consulted and admission to the medical floor.   Assessment & Plan:   Principal Problem:   Diverticulitis of small bowel  Diverticulitis with small bowel: Clinically improving.  Improved abdominal pain.  Followed by surgery. Continue IV fluids.  Will allow chips and sips and ultimately clears if no pain. Continue IV antibiotics.  Further management as per surgery.  Not anticipating any surgical intervention.  Sepsis present on admission secondary to intra-abdominal infection as above.  Acute kidney injury.  Severe dehydration. Presented with tachycardia, leukopenia and acute renal failure. still needing more resuscitation.  Blood cultures pending. 2 L of isotonic fluid now.  Keep on maintenance fluid.  Aggressive electrolyte replacement.  Intake and output monitoring.  Recheck renal functions tomorrow.  Hypokalemia: Severe and persistent.  On diuretic therapy at home.  Discontinue diuresis.  Replace aggressively.  Also magnesium replacement.  Abnormal EKG: Atrial tachycardia.  No evidence of A. fib.  Patient asymptomatic. TSH normal.  Improved with large isotonic fluid resuscitation.  Monitor.  DVT prophylaxis:   Lovenox subcu   Code Status: Full code Family Communication: None Disposition Plan: Status is: Inpatient  Remains inpatient appropriate because: Significant electrolyte abnormalities.  Abdominal  pain and IV antibiotics.         Consultants:  General surgery  Procedures:  None  Antimicrobials:  Ceftriaxone and Flagyl 12/16---   Subjective: Patient seen and examined in the emergency room.  His heart rate was 120-160 on monitor but patient denied any chest pain or palpitations.  Denies any shortness of breath. His abdomen pain is mostly improved. Remains afebrile.  On room air.  Objective: Vitals:   12/15/21 0930 12/15/21 1000 12/15/21 1030 12/15/21 1100  BP: (!) 89/68 (!) 81/64 (!) 86/68 90/69  Pulse: (!) 50 (!) 51 (!) 162 86  Resp: (!) 22 (!) 35 (!) 23 (!) 23  Temp:      TempSrc:      SpO2: 94% 93% 96% 92%  Weight:      Height:        Intake/Output Summary (Last 24 hours) at 12/15/2021 1152 Last data filed at 12/15/2021 0943 Gross per 24 hour  Intake 500 ml  Output --  Net 500 ml   Filed Weights   12/14/21 2021  Weight: 116.1 kg    Examination:  General exam: Appears calm and comfortable  Not in any distress.  On room air. Respiratory system: Clear to auscultation. Respiratory effort normal.  No added sounds. Cardiovascular system: S1 & S2 heard, tachycardic.  Regular. Gastrointestinal system: Obese.  Nontender.  Bowel sound present. Central nervous system: Alert and oriented. No focal neurological deficits. Extremities: Symmetric 5 x 5 power. Skin: No rashes, lesions or ulcers Psychiatry: Judgement and insight appear normal. Mood & affect appropriate.     Data Reviewed: I have personally reviewed following labs and imaging studies  CBC: Recent Labs  Lab 12/14/21 2010 12/15/21 0622  WBC 2.6* 12.5*  NEUTROABS 1.9  --   HGB 13.1  12.5*  HCT 38.4* 35.9*  MCV 75.3* 74.8*  PLT 151 [id number]   Basic Metabolic Panel: Recent Labs  Lab 12/14/21 2010 12/15/21 0622  NA 132* 139  K 2.8* 2.7*  CL 100 104  CO2 19* 24  GLUCOSE 190* 124*  BUN 22* 24*  CREATININE 1.85* 1.94*  CALCIUM 8.1* 7.9*   GFR: Estimated Creatinine Clearance: 45.4  mL/min (A) (by C-G formula based on SCr of 1.94 mg/dL (H)). Liver Function Tests: Recent Labs  Lab 12/14/21 2010  AST 74*  ALT 32  ALKPHOS 119  BILITOT 2.3*  PROT 8.4*  ALBUMIN 3.3*   Recent Labs  Lab 12/14/21 2010  LIPASE 56*   No results for input(s): AMMONIA in the last 168 hours. Coagulation Profile: Recent Labs  Lab 12/15/21 0622  INR 1.3*   Cardiac Enzymes: No results for input(s): CKTOTAL, CKMB, CKMBINDEX, TROPONINI in the last 168 hours. BNP (last 3 results) No results for input(s): PROBNP in the last 8760 hours. HbA1C: No results for input(s): HGBA1C in the last 72 hours. CBG: Recent Labs  Lab 12/14/21 2008  GLUCAP 203*   Lipid Profile: No results for input(s): CHOL, HDL, LDLCALC, TRIG, CHOLHDL, LDLDIRECT in the last 72 hours. Thyroid Function Tests: Recent Labs    12/15/21 0622  TSH 0.822   Anemia Panel: No results for input(s): VITAMINB12, FOLATE, FERRITIN, TIBC, IRON, RETICCTPCT in the last 72 hours. Sepsis Labs: Recent Labs  Lab 12/15/21 0622  PROCALCITON >150.00    Recent Results (from the past 240 hour(s))  Resp Panel by RT-PCR (Flu A&B, Covid) Nasopharyngeal Swab     Status: None   Collection Time: 12/14/21  8:16 PM   Specimen: Nasopharyngeal Swab; Nasopharyngeal(NP) swabs in vial transport medium  Result Value Ref Range Status   SARS Coronavirus 2 by RT PCR NEGATIVE NEGATIVE Final    Comment: (NOTE) SARS-CoV-2 target nucleic acids are NOT DETECTED.  The SARS-CoV-2 RNA is generally detectable in upper respiratory specimens during the acute phase of infection. The lowest concentration of SARS-CoV-2 viral copies this assay can detect is 138 copies/mL. A negative result does not preclude SARS-Cov-2 infection and should not be used as the sole basis for treatment or other patient management decisions. A negative result may occur with  improper specimen collection/handling, submission of specimen other than nasopharyngeal swab, presence  of viral mutation(s) within the areas targeted by this assay, and inadequate number of viral copies(<138 copies/mL). A negative result must be combined with clinical observations, patient history, and epidemiological information. The expected result is Negative.  Fact Sheet for Patients:  EntrepreneurPulse.com.au  Fact Sheet for Healthcare Providers:  IncredibleEmployment.be  This test is no t yet approved or cleared by the [place] FDA and  has been authorized for detection and/or diagnosis of SARS-CoV-2 by FDA under an Emergency Use Authorization (EUA). This EUA will remain  in effect (meaning this test can be used) for the duration of the COVID-19 declaration under Section 564(b)(1) of the Act, 21 U.S.C.section 360bbb-3(b)(1), unless the authorization is terminated  or revoked sooner.       Influenza A by PCR NEGATIVE NEGATIVE Final   Influenza B by PCR NEGATIVE NEGATIVE Final    Comment: (NOTE) The Xpert Xpress SARS-CoV-2/FLU/RSV plus assay is intended as an aid in the diagnosis of influenza from Nasopharyngeal swab specimens and should not be used as a sole basis for treatment. Nasal washings and aspirates are unacceptable for Xpert Xpress SARS-CoV-2/FLU/RSV testing.  Fact Sheet for Patients: EntrepreneurPulse.com.au  Fact Sheet for Healthcare Providers: IncredibleEmployment.be  This test is not yet approved or cleared by the [place] FDA and has been authorized for detection and/or diagnosis of SARS-CoV-2 by FDA under an Emergency Use Authorization (EUA). This EUA will remain in effect (meaning this test can be used) for the duration of the COVID-19 declaration under Section 564(b)(1) of the Act, 21 U.S.C. section 360bbb-3(b)(1), unless the authorization is terminated or revoked.  Performed at [facility], [address] [place], [place] [address]          Radiology  Studies: DG Chest 1 View  Result Date: 12/14/2021 CLINICAL DATA:  Upper abdominal pain and vomiting. EXAM: CHEST  1 VIEW COMPARISON:  May 13, 2021 FINDINGS: Very mild, stable atelectasis is seen within the bilateral lung bases. There is no evidence of a pleural effusion or pneumothorax. The heart size and mediastinal contours are within normal limits. The visualized skeletal structures are unremarkable. IMPRESSION: Very mild bibasilar atelectasis. Electronically Signed   By: Virgina Norfolk M.D.   On: 12/14/2021 20:34   CT ABDOMEN PELVIS W CONTRAST  Result Date: 12/14/2021 CLINICAL DATA:  Upper abdominal pain for 2 days EXAM: CT ABDOMEN AND PELVIS WITH CONTRAST TECHNIQUE: Multidetector CT imaging of the abdomen and pelvis was performed using the standard protocol following bolus administration of intravenous contrast. CONTRAST:  76mL OMNIPAQUE IOHEXOL 300 MG/ML  SOLN COMPARISON:  None. FINDINGS: Lower chest: No acute abnormality. Hepatobiliary: No focal liver abnormality is seen. No gallstones, gallbladder wall thickening, or biliary dilatation. Pancreas: Unremarkable. No pancreatic ductal dilatation or surrounding inflammatory changes. Spleen: Normal in size without focal abnormality. Adrenals/Urinary Tract: Adrenal glands are within normal limits. Kidneys demonstrate a normal enhancement pattern bilaterally. Large simple cyst is noted arising from the lower pole of the left kidney measuring 5 cm in greatest dimension. No obstructive changes are seen. The ureters are within normal limits. The bladder is partially distended. Stomach/Bowel: No obstructive or inflammatory changes of colon are seen. The appendix is within normal limits. Stomach is within normal limits. There are multiple fluid-filled outpouchings in the mid jejunum consistent with small bowel diverticular change. Some associated inflammatory changes are noted in the adjacent mesentery with findings suggestive of early venous air related to  the inflammatory change. No definitive portal venous air is noted. Vascular/Lymphatic: Atherosclerotic calcifications are seen. Some inflammatory changes are noted in the root of the small bowel mesentery in the left mid abdomen as described above. Some linear areas of air are noted in the inflamed mesentery and the possibility of early SMV air could not be totally excluded. Reproductive: Prostate is unremarkable. Other: Small fat containing umbilical hernia is noted. No abdominopelvic ascites. Musculoskeletal: No acute or significant osseous findings. IMPRESSION: Changes consistent with small bowel diverticulitis with inflammatory changes in the mesentery in the left mid abdomen as well as some very early venous air adjacent to the small bowel loops. Left renal cyst. Critical Value/emergent results were called by telephone at the time of interpretation on 12/14/2021 at 10:34 pm to Dr. Conni Slipper , who verbally acknowledged these results. Electronically Signed   By: Inez Catalina M.D.   On: 12/14/2021 22:38        Scheduled Meds:  enoxaparin (LOVENOX) injection  0.5 mg/kg Subcutaneous Q24H   potassium chloride  40 mEq Oral TID   Continuous Infusions:  0.9 % NaCl with KCl 20 mEq / L     cefTRIAXone (ROCEPHIN)  IV Stopped (12/15/21 0850)   metronidazole Stopped (  12/15/21 0943)     LOS: 1 day    Time spent: 35 minutes    Barb Merino, MD [facility] Pager [phone]

## 2021-12-15 NOTE — Progress Notes (Signed)
Patient arrived to unit. IV site assessed. Telemetry applied with second verify. Patient oriented to unit and room. Patient given call light to reach nurse or nurse tech and room phone. Provided patient with a urinal and instructed to call prior to getting out of bed so that assistance could be provided. Patient denies pain and is resting comfortably in bed.

## 2021-12-15 NOTE — ED Notes (Signed)
Pt unhooked and assisted to toilet.

## 2021-12-15 NOTE — ED Notes (Signed)
Pt endorsing am episode of watery stool.

## 2021-12-15 NOTE — ED Notes (Addendum)
Per Milagros Loll, RN night shift nurse, pt HR was in 160s A fib RVR but spontaneous converted himself before shift change, no pharmological interventions noted. Pt HR at shift change was 70-80s NSR, pt HR increased to 140-150s at this time, BP also low 80s systolic. ST on the monitor with occasional PVCs. EKG exported and Dr. Jerral Ralph made aware.

## 2021-12-16 ENCOUNTER — Other Ambulatory Visit: Payer: Self-pay

## 2021-12-16 DIAGNOSIS — N179 Acute kidney failure, unspecified: Secondary | ICD-10-CM | POA: Diagnosis present

## 2021-12-16 DIAGNOSIS — E871 Hypo-osmolality and hyponatremia: Secondary | ICD-10-CM | POA: Diagnosis present

## 2021-12-16 DIAGNOSIS — I10 Essential (primary) hypertension: Secondary | ICD-10-CM | POA: Diagnosis present

## 2021-12-16 DIAGNOSIS — R7989 Other specified abnormal findings of blood chemistry: Secondary | ICD-10-CM | POA: Diagnosis present

## 2021-12-16 DIAGNOSIS — R778 Other specified abnormalities of plasma proteins: Secondary | ICD-10-CM | POA: Diagnosis present

## 2021-12-16 DIAGNOSIS — R9431 Abnormal electrocardiogram [ECG] [EKG]: Secondary | ICD-10-CM | POA: Diagnosis present

## 2021-12-16 DIAGNOSIS — A419 Sepsis, unspecified organism: Secondary | ICD-10-CM | POA: Diagnosis present

## 2021-12-16 DIAGNOSIS — E876 Hypokalemia: Secondary | ICD-10-CM | POA: Diagnosis present

## 2021-12-16 DIAGNOSIS — R7401 Elevation of levels of liver transaminase levels: Secondary | ICD-10-CM | POA: Diagnosis present

## 2021-12-16 LAB — PHOSPHORUS: Phosphorus: 1.8 mg/dL — ABNORMAL LOW (ref 2.5–4.6)

## 2021-12-16 LAB — COMPREHENSIVE METABOLIC PANEL
ALT: 35 U/L (ref 0–44)
ALT: 37 U/L (ref 0–44)
AST: 66 U/L — ABNORMAL HIGH (ref 15–41)
AST: 66 U/L — ABNORMAL HIGH (ref 15–41)
Albumin: 2.8 g/dL — ABNORMAL LOW (ref 3.5–5.0)
Albumin: 2.7 g/dL — ABNORMAL LOW (ref 3.5–5.0)
Alkaline Phosphatase: 96 U/L (ref 38–126)
Alkaline Phosphatase: 122 U/L (ref 38–126)
Anion gap: 10 (ref 5–15)
Anion gap: 6 (ref 5–15)
BUN: 21 mg/dL — ABNORMAL HIGH (ref 6–20)
BUN: 18 mg/dL (ref 6–20)
CO2: 19 mmol/L — ABNORMAL LOW (ref 22–32)
CO2: 22 mmol/L (ref 22–32)
Calcium: 7.4 mg/dL — ABNORMAL LOW (ref 8.9–10.3)
Calcium: 7.7 mg/dL — ABNORMAL LOW (ref 8.9–10.3)
Chloride: 112 mmol/L — ABNORMAL HIGH (ref 98–111)
Chloride: 111 mmol/L (ref 98–111)
Creatinine, Ser: 1.56 mg/dL — ABNORMAL HIGH (ref 0.61–1.24)
Creatinine, Ser: 1.26 mg/dL — ABNORMAL HIGH (ref 0.61–1.24)
GFR, Estimated: 51 mL/min — ABNORMAL LOW (ref >60)
GFR, Estimated: >60 mL/min (ref >60)
Glucose, Bld: 86 mg/dL (ref 70–99)
Glucose, Bld: 128 mg/dL — ABNORMAL HIGH (ref 70–99)
Potassium: 3.9 mmol/L (ref 3.5–5.1)
Potassium: 4 mmol/L (ref 3.5–5.1)
Sodium: 141 mmol/L (ref 135–145)
Sodium: 139 mmol/L (ref 135–145)
Total Bilirubin: 1.3 mg/dL — ABNORMAL HIGH (ref 0.3–1.2)
Total Bilirubin: 1.1 mg/dL (ref 0.3–1.2)
Total Protein: 6.6 g/dL (ref 6.5–8.1)
Total Protein: 7.4 g/dL (ref 6.5–8.1)

## 2021-12-16 LAB — BASIC METABOLIC PANEL
Anion gap: 9 (ref 5–15)
BUN: 19 mg/dL (ref 6–20)
CO2: 20 mmol/L — ABNORMAL LOW (ref 22–32)
Calcium: 7.4 mg/dL — ABNORMAL LOW (ref 8.9–10.3)
Chloride: 112 mmol/L — ABNORMAL HIGH (ref 98–111)
Creatinine, Ser: 1.49 mg/dL — ABNORMAL HIGH (ref 0.61–1.24)
GFR, Estimated: 53 mL/min — ABNORMAL LOW (ref >60)
Glucose, Bld: 86 mg/dL (ref 70–99)
Potassium: 3.8 mmol/L (ref 3.5–5.1)
Sodium: 141 mmol/L (ref 135–145)

## 2021-12-16 LAB — CBC
HCT: 32.7 % — ABNORMAL LOW (ref 39.0–52.0)
Hemoglobin: 11.1 g/dL — ABNORMAL LOW (ref 13.0–17.0)
MCH: 25.8 pg — ABNORMAL LOW (ref 26.0–34.0)
MCHC: 33.9 g/dL (ref 30.0–36.0)
MCV: 76 fL — ABNORMAL LOW (ref 80.0–100.0)
Platelets: 146 10*3/uL — ABNORMAL LOW (ref 150–400)
RBC: 4.3 MIL/uL (ref 4.22–5.81)
RDW: 15.6 % — ABNORMAL HIGH (ref 11.5–15.5)
WBC: 5.8 10*3/uL (ref 4.0–10.5)
nRBC: 0 % (ref 0.0–0.2)

## 2021-12-16 LAB — TROPONIN I (HIGH SENSITIVITY): Troponin I (High Sensitivity): 72 ng/L — ABNORMAL HIGH (ref <18)

## 2021-12-16 LAB — GLUCOSE, CAPILLARY: Glucose-Capillary: 105 mg/dL — ABNORMAL HIGH (ref 70–99)

## 2021-12-16 MED ORDER — METOPROLOL TARTRATE 25 MG PO TABS
12.5000 mg | ORAL_TABLET | Freq: Once | ORAL | Status: AC
Start: 2021-12-16 — End: 2021-12-16
  Administered 2021-12-16: 05:00:00 12.5 mg via ORAL
  Filled 2021-12-16: qty 1

## 2021-12-16 MED ORDER — SODIUM CHLORIDE 0.9 % IV BOLUS
500.0000 mL | Freq: Once | INTRAVENOUS | Status: AC
  Administered 2021-12-16: 500 mL via INTRAVENOUS

## 2021-12-16 MED ORDER — LACTATED RINGERS IV SOLN
INTRAVENOUS | Status: DC
Start: 2021-12-16 — End: 2021-12-19

## 2021-12-16 NOTE — Progress Notes (Signed)
 [facility]ESS [facility]   Hospital Day(s): 2.   Interval History: Patient seen and examined, no acute events or new complaints overnight.  Patient states that he is feeling significantly better today.  His abdominal pain has resolved.  He denies nausea and vomiting.  He is passing gas and had a bowel movement today.  His leukocytosis of 12.5 yesterday improved to 5.8 today.  He is asking if he can trial a diet today.  Review of Systems:  Constitutional: denies fever, chills  HEENT: denies cough or congestion  Respiratory: denies any shortness of breath  Cardiovascular: denies chest pain or palpitations  Gastrointestinal: denies abdominal pain, N/V Genitourinary: denies burning with urination or urinary frequency Musculoskeletal: denies pain, decreased motor or sensation Integumentary: denies any other rashes or skin discolorations  Vital signs in last 24 hours: [min-max] current  Temp:  [97.9 F (36.6 C)-101 F (38.3 C)] 101 F (38.3 C) (12/18 0355) Pulse Rate:  [50-173] 100 (12/18 0356) Resp:  [14-40] 20 (12/18 0355) BP: (81-122)/(52-97) 117/81 (12/18 0355) SpO2:  [92 %-98 %] 97 % (12/18 0356)     Height: 5\' 2"  (157.5 cm) Weight: 116.1 kg     Intake/Output last 2 shifts:  12/17 0701 - 12/18 0700 In: 180 [P.O.:30; IV Piggyback:150] Out: 0    Physical Exam:  Constitutional: alert, cooperative and no distress  HENT: normocephalic without obvious abnormality  Respiratory: breathing non-labored at rest  Cardiovascular: regular rate and sinus rhythm  Gastrointestinal: soft, non-tender, and non-distended; No rigidity, guarding, rebound tenderness Musculoskeletal: no edema or wounds, motor and sensation grossly intact, NT    Labs:  CBC Latest Ref Rng & Units 12/15/2021 12/14/2021 05/13/2021  WBC 4.0 - 10.5 K/uL 12.5(H) 2.6(L) 7.5  Hemoglobin 13.0 - 17.0 g/dL 12.5(L) 13.1 11.9(L)  Hematocrit 39.0 - 52.0 % 35.9(L) 38.4(L) 35.7(L)  Platelets 150 - 400  K/uL 171 151 346   CMP Latest Ref Rng & Units 12/16/2021 12/16/2021 12/15/2021  Glucose 70 - 99 mg/dL 86 86 95  BUN 6 - 20 mg/dL 12/17/2021) 19 41(L)  Creatinine 0.61 - 1.24 mg/dL 24(M) 0.10(U) 7.25(D)  Sodium 135 - 145 mmol/L 141 141 140  Potassium 3.5 - 5.1 mmol/L 3.9 3.8 3.1(L)  Chloride 98 - 111 mmol/L 112(H) 112(H) 108  CO2 22 - 32 mmol/L 19(L) 20(L) 21(L)  Calcium 8.9 - 10.3 mg/dL 7.4(L) 7.4(L) 7.3(L)  Total Protein 6.5 - 8.1 g/dL 6.6 - 7.1  Total Bilirubin 0.3 - 1.2 mg/dL 6.64(Q) - 1.5(H)  Alkaline Phos 38 - 126 U/L 96 - 106  AST 15 - 41 U/L 66(H) - 69(H)  ALT 0 - 44 U/L 35 - 36     Imaging studies: No new pertinent imaging studies   Assessment/Plan:  60 y.o. male with abdominal pain and small bowel diverticulitis, complicated by pertinent comorbidities including HTN and AKI with Cr 1.94.  WBC 12.5   -Patient continues to have benign abdominal exam  -Clear liquid diet ordered  -Continue antibiotics, Rocephin and Flagyl  -Normal saline at 100 cc/hr  -Pain control as needed  -Continue to monitor abdominal exam  -Care per primary team  All of the above findings and recommendations were discussed with the patient, and all of patient's questions were answered to his expressed satisfaction.   -- 67, DO

## 2021-12-16 NOTE — Progress Notes (Addendum)
PROGRESS NOTE    Steven Pham  BJY:[id number] DOB: 18-Mar-1961 DOA: 12/14/2021 PCP: Ellen Henri, MD    Brief Narrative:  60 year old gentleman with history of osteoarthritis, gout and hypertension on thiazide and Lasix presented to ED on 12/14/2021 with left-sided abdominal pain, dizziness and lightheadedness with some nausea and vomiting and chills. In the ED, pt was tachycardic, severely hypokalemic and hyponatremic, with AKI, leukopenic.  CT scan abdomen with small bowel diverticulitis with some inflammatory changes. General surgery consulted and pt admitted for further evaluation and management.    Assessment & Plan:   Principal Problem:   Diverticulitis of small bowel Active Problems:   Hypokalemia   Hyponatremia   Elevated troponin   Transaminitis   Abnormal EKG   Essential hypertension   Sepsis (HCC)   AKI (acute kidney injury) (HCC)  Diverticulitis of the small bowel: Clinically improving.  Abdominal pain improved.   General surgery following. Continue IV fluids.   Tolerating clear liquids, diet advancement per surgery Continue IV antibiotics.   Further management as per surgery.   Not anticipating any surgical intervention. Monitor abdominal exam.  Sepsis present on admission secondary to intra-abdominal infection as above.   Acute kidney injury.  Severe dehydration. Presented with tachycardia, leukopenia and acute renal failure. still needing more resuscitation.   Blood cultures pending. 2 L of isotonic fluid now.   Continue maintenance fluids.   Monitor renal function.  Hypokalemia: POA, resolved with replacement.  On diuretic therapy at home.  Discontinue diuresis.   Continue to monitor and replace K and Mg as needed.  Abnormal EKG: Atrial tachycardia.  No evidence of A. fib.   Patient reports 1-2 year history of similar, reports having work up done but I don't find records in [facility] or [facility].   Patient asymptomatic.  TSH normal.   Improved  with fluid resuscitation.   Monitor on telemetry.  DVT prophylaxis:   Lovenox subcu   Code Status: Full code Family Communication: None Disposition Plan: Status is: Inpatient  Remains inpatient appropriate because: Continuing IV antibiotics.  D/C pending clearance by general surgery and tolerating adequate PO intake.         Consultants:  General surgery  Procedures:  None  Antimicrobials:  Ceftriaxone and Flagyl 12/16---   Subjective: Patient seen and examined in the emergency room.  His heart rate was 120-160 on monitor but patient denied any chest pain or palpitations.  Denies any shortness of breath. His abdomen pain is mostly improved. Remains afebrile.  On room air.  Objective: Vitals:   12/16/21 0355 12/16/21 0356 12/16/21 0726 12/16/21 1545  BP: 117/81  103/73 118/86  Pulse: (!) 101 100 81 95  Resp: 20  14 14   Temp: (!) 101 F (38.3 C)  98.2 F (36.8 C) 98.6 F (37 C)  TempSrc: Oral  Oral Oral  SpO2: 98% 97% 97% 98%  Weight:      Height:        Intake/Output Summary (Last 24 hours) at 12/16/2021 1745 Last data filed at 12/16/2021 1502 Gross per 24 hour  Intake 909.43 ml  Output 0 ml  Net 909.43 ml   Filed Weights   12/14/21 2021  Weight: 116.1 kg    Examination:  General exam: awake, alert, no acute distress HEENT: atraumatic, clear conjunctiva, anicteric sclera, moist mucus membranes, hearing grossly normal  Respiratory system: CTAB, no wheezes, rales or rhonchi, normal respiratory effort. Cardiovascular system: normal S1/S2,  RRR, no JVD, murmurs, rubs, gallops,  no pedal edema.  Gastrointestinal system: soft, NT, ND, no HSM felt, +bowel sounds. Central nervous system: A&O x4. no gross focal neurologic deficits, normal speech Extremities: moves all , no edema, normal tone Skin: dry, intact, normal temperature, normal color, No rashes, lesions or ulcers Psychiatry: normal mood, congruent affect, judgement and insight appear  normal    Data Reviewed: I have personally reviewed following labs and imaging studies  CBC: Recent Labs  Lab 12/14/21 2010 12/15/21 0622 12/16/21 0930  WBC 2.6* 12.5* 5.8  NEUTROABS 1.9  --   --   HGB 13.1 12.5* 11.1*  HCT 38.4* 35.9* 32.7*  MCV 75.3* 74.8* 76.0*  PLT 151 171 123456*   Basic Metabolic Panel: Recent Labs  Lab 12/14/21 2010 12/15/21 0622 12/15/21 1718 12/16/21 0222  NA 132* 139 140 141   141  K 2.8* 2.7* 3.1* 3.9   3.8  CL 100 104 108 112*   112*  CO2 19* 24 21* 19*   20*  GLUCOSE 190* 124* 95 86   86  BUN 22* 24* 22* 21*   19  CREATININE 1.85* 1.94* 1.60* 1.56*   1.49*  CALCIUM 8.1* 7.9* 7.3* 7.4*   7.4*  MG  --   --  1.6*  --    GFR: Estimated Creatinine Clearance: 56.4 mL/min (A) (by C-G formula based on SCr of 1.56 mg/dL (H)). Liver Function Tests: Recent Labs  Lab 12/14/21 2010 12/15/21 1718 12/16/21 0222  AST 74* 69* 66*  ALT 32 36 35  ALKPHOS 119 106 96  BILITOT 2.3* 1.5* 1.3*  PROT 8.4* 7.1 6.6  ALBUMIN 3.3* 2.8* 2.8*   Recent Labs  Lab 12/14/21 2010  LIPASE 56*   No results for input(s): AMMONIA in the last 168 hours. Coagulation Profile: Recent Labs  Lab 12/15/21 0622  INR 1.3*   Cardiac Enzymes: No results for input(s): CKTOTAL, CKMB, CKMBINDEX, TROPONINI in the last 168 hours. BNP (last 3 results) No results for input(s): PROBNP in the last 8760 hours. HbA1C: No results for input(s): HGBA1C in the last 72 hours. CBG: Recent Labs  Lab 12/14/21 2008 12/16/21 0011  GLUCAP 203* 105*   Lipid Profile: No results for input(s): CHOL, HDL, LDLCALC, TRIG, CHOLHDL, LDLDIRECT in the last 72 hours. Thyroid Function Tests: Recent Labs    12/15/21 0622  TSH 0.822   Anemia Panel: No results for input(s): VITAMINB12, FOLATE, FERRITIN, TIBC, IRON, RETICCTPCT in the last 72 hours. Sepsis Labs: Recent Labs  Lab 12/15/21 0622 12/15/21 2007 12/15/21 2223  PROCALCITON >150.00  --   --   LATICACIDVEN  --  1.3 1.5     Recent Results (from the past 240 hour(s))  Resp Panel by RT-PCR (Flu A&B, Covid) Nasopharyngeal Swab     Status: None   Collection Time: 12/14/21  8:16 PM   Specimen: Nasopharyngeal Swab; Nasopharyngeal(NP) swabs in vial transport medium  Result Value Ref Range Status   SARS Coronavirus 2 by RT PCR NEGATIVE NEGATIVE Final    Comment: (NOTE) SARS-CoV-2 target nucleic acids are NOT DETECTED.  The SARS-CoV-2 RNA is generally detectable in upper respiratory specimens during the acute phase of infection. The lowest concentration of SARS-CoV-2 viral copies this assay can detect is 138 copies/mL. A negative result does not preclude SARS-Cov-2 infection and should not be used as the sole basis for treatment or other patient management decisions. A negative result may occur with  improper specimen collection/handling, submission of specimen other than nasopharyngeal swab, presence of viral mutation(s) within the areas targeted by  this assay, and inadequate number of viral copies(<138 copies/mL). A negative result must be combined with clinical observations, patient history, and epidemiological information. The expected result is Negative.  Fact Sheet for Patients:  EntrepreneurPulse.com.au  Fact Sheet for Healthcare Providers:  IncredibleEmployment.be  This test is no t yet approved or cleared by the [place] FDA and  has been authorized for detection and/or diagnosis of SARS-CoV-2 by FDA under an Emergency Use Authorization (EUA). This EUA will remain  in effect (meaning this test can be used) for the duration of the COVID-19 declaration under Section 564(b)(1) of the Act, 21 U.S.C.section 360bbb-3(b)(1), unless the authorization is terminated  or revoked sooner.       Influenza A by PCR NEGATIVE NEGATIVE Final   Influenza B by PCR NEGATIVE NEGATIVE Final    Comment: (NOTE) The Xpert Xpress SARS-CoV-2/FLU/RSV plus assay is intended as an  aid in the diagnosis of influenza from Nasopharyngeal swab specimens and should not be used as a sole basis for treatment. Nasal washings and aspirates are unacceptable for Xpert Xpress SARS-CoV-2/FLU/RSV testing.  Fact Sheet for Patients: EntrepreneurPulse.com.au  Fact Sheet for Healthcare Providers: IncredibleEmployment.be  This test is not yet approved or cleared by the [place] FDA and has been authorized for detection and/or diagnosis of SARS-CoV-2 by FDA under an Emergency Use Authorization (EUA). This EUA will remain in effect (meaning this test can be used) for the duration of the COVID-19 declaration under Section 564(b)(1) of the Act, 21 U.S.C. section 360bbb-3(b)(1), unless the authorization is terminated or revoked.  Performed at [facility], [address], [place], [place] [address]          Radiology Studies: DG Chest 1 View  Result Date: 12/14/2021 CLINICAL DATA:  Upper abdominal pain and vomiting. EXAM: CHEST  1 VIEW COMPARISON:  May 13, 2021 FINDINGS: Very mild, stable atelectasis is seen within the bilateral lung bases. There is no evidence of a pleural effusion or pneumothorax. The heart size and mediastinal contours are within normal limits. The visualized skeletal structures are unremarkable. IMPRESSION: Very mild bibasilar atelectasis. Electronically Signed   By: Virgina Norfolk M.D.   On: 12/14/2021 20:34   CT ABDOMEN PELVIS W CONTRAST  Result Date: 12/14/2021 CLINICAL DATA:  Upper abdominal pain for 2 days EXAM: CT ABDOMEN AND PELVIS WITH CONTRAST TECHNIQUE: Multidetector CT imaging of the abdomen and pelvis was performed using the standard protocol following bolus administration of intravenous contrast. CONTRAST:  17mL OMNIPAQUE IOHEXOL 300 MG/ML  SOLN COMPARISON:  None. FINDINGS: Lower chest: No acute abnormality. Hepatobiliary: No focal liver abnormality is seen. No gallstones, gallbladder wall  thickening, or biliary dilatation. Pancreas: Unremarkable. No pancreatic ductal dilatation or surrounding inflammatory changes. Spleen: Normal in size without focal abnormality. Adrenals/Urinary Tract: Adrenal glands are within normal limits. Kidneys demonstrate a normal enhancement pattern bilaterally. Large simple cyst is noted arising from the lower pole of the left kidney measuring 5 cm in greatest dimension. No obstructive changes are seen. The ureters are within normal limits. The bladder is partially distended. Stomach/Bowel: No obstructive or inflammatory changes of colon are seen. The appendix is within normal limits. Stomach is within normal limits. There are multiple fluid-filled outpouchings in the mid jejunum consistent with small bowel diverticular change. Some associated inflammatory changes are noted in the adjacent mesentery with findings suggestive of early venous air related to the inflammatory change. No definitive portal venous air is noted. Vascular/Lymphatic: Atherosclerotic calcifications are seen. Some inflammatory changes are noted in the  root of the small bowel mesentery in the left mid abdomen as described above. Some linear areas of air are noted in the inflamed mesentery and the possibility of early SMV air could not be totally excluded. Reproductive: Prostate is unremarkable. Other: Small fat containing umbilical hernia is noted. No abdominopelvic ascites. Musculoskeletal: No acute or significant osseous findings. IMPRESSION: Changes consistent with small bowel diverticulitis with inflammatory changes in the mesentery in the left mid abdomen as well as some very early venous air adjacent to the small bowel loops. Left renal cyst. Critical Value/emergent results were called by telephone at the time of interpretation on 12/14/2021 at 10:34 pm to Dr. Dorothea Glassman , who verbally acknowledged these results. Electronically Signed   By: Alcide Clever M.D.   On: 12/14/2021 22:38         Scheduled Meds:  enoxaparin (LOVENOX) injection  0.5 mg/kg Subcutaneous Q24H   Continuous Infusions:  cefTRIAXone (ROCEPHIN)  IV Stopped (12/16/21 2979)   lactated ringers 100 mL/hr at 12/16/21 1502   metronidazole Stopped (12/16/21 0707)     LOS: 2 days    Time spent: 30 minutes    Pennie Banter, DO [facility] Pager [phone]

## 2021-12-16 NOTE — Progress Notes (Signed)
Patient's heart rate has being rising to 150s-160s and then coming right back down to normal heart rate limits. MD notified and aware. CCMD also called and made aware. MD ordered to notify if heart rate sustains elevated for 15 minutes.

## 2021-12-16 NOTE — Hospital Course (Signed)
60 year old gentleman with history of osteoarthritis, gout and hypertension on thiazide and Lasix presented to ED on 12/14/2021 with left-sided abdominal pain, dizziness and lightheadedness with some nausea and vomiting and chills. In the ED, pt was tachycardic, severely hypokalemic and hyponatremic, with AKI, leukopenic.  CT scan abdomen with small bowel diverticulitis with some inflammatory changes. General surgery consulted and pt admitted for further evaluation and management.

## 2021-12-17 LAB — MAGNESIUM: Magnesium: 2.1 mg/dL (ref 1.7–2.4)

## 2021-12-17 LAB — CBC
HCT: 31.6 % — ABNORMAL LOW (ref 39.0–52.0)
Hemoglobin: 10.8 g/dL — ABNORMAL LOW (ref 13.0–17.0)
MCH: 25.7 pg — ABNORMAL LOW (ref 26.0–34.0)
MCHC: 34.2 g/dL (ref 30.0–36.0)
MCV: 75.1 fL — ABNORMAL LOW (ref 80.0–100.0)
Platelets: 145 10*3/uL — ABNORMAL LOW (ref 150–400)
RBC: 4.21 MIL/uL — ABNORMAL LOW (ref 4.22–5.81)
RDW: 15.9 % — ABNORMAL HIGH (ref 11.5–15.5)
WBC: 6 10*3/uL (ref 4.0–10.5)
nRBC: 0 % (ref 0.0–0.2)

## 2021-12-17 LAB — COMPREHENSIVE METABOLIC PANEL
ALT: 32 U/L (ref 0–44)
AST: 54 U/L — ABNORMAL HIGH (ref 15–41)
Albumin: 2.5 g/dL — ABNORMAL LOW (ref 3.5–5.0)
Alkaline Phosphatase: 121 U/L (ref 38–126)
Anion gap: 6 (ref 5–15)
BUN: 14 mg/dL (ref 6–20)
CO2: 24 mmol/L (ref 22–32)
Calcium: 7.9 mg/dL — ABNORMAL LOW (ref 8.9–10.3)
Chloride: 110 mmol/L (ref 98–111)
Creatinine, Ser: 1.17 mg/dL (ref 0.61–1.24)
GFR, Estimated: >60 mL/min (ref >60)
Glucose, Bld: 116 mg/dL — ABNORMAL HIGH (ref 70–99)
Potassium: 3.5 mmol/L (ref 3.5–5.1)
Sodium: 140 mmol/L (ref 135–145)
Total Bilirubin: 1 mg/dL (ref 0.3–1.2)
Total Protein: 6.7 g/dL (ref 6.5–8.1)

## 2021-12-17 MED ORDER — POTASSIUM PHOSPHATES 15 MMOLE/5ML IV SOLN
20.0000 mmol | Freq: Once | INTRAVENOUS | Status: AC
  Administered 2021-12-17: 16:00:00 20 mmol via INTRAVENOUS
  Filled 2021-12-17: qty 6.67

## 2021-12-17 NOTE — Progress Notes (Signed)
PROGRESS NOTE    Steven Pham  FFM:[id number] DOB: 1961-02-06 DOA: 12/14/2021 PCP: Ellen Henri, MD    Brief Narrative:  60 year old gentleman with history of osteoarthritis, gout and hypertension on thiazide and Lasix presented to emergency room with acute onset of left-sided abdominal pain, dizziness and lightheadedness with some nausea and vomiting and chills. In the emergency room tachycardic, severely hypokalemic and hyponatremic, AKI.  COVID-19 and influenza negative.  Leukopenic.  CT scan abdomen with small bowel diverticulitis with some inflammatory changes.  Surgery consulted and admission to the medical floor.   Assessment & Plan:   Principal Problem:   Diverticulitis of small bowel Active Problems:   Sepsis (HCC)   AKI (acute kidney injury) (HCC)   Hypokalemia   Hyponatremia   Elevated troponin   Transaminitis   Abnormal EKG   Essential hypertension  Diverticulitis with small bowel: Clinically improving.  On IV antibiotics.  Will treat with total of 2 weeks of therapy.  Change to oral tomorrow if able to discharge home.  On liquid diet.  Advancing to full liquid diet today. Followed by surgery.  Anticipating conservative management. Hopefully can challenge with soft diet by evening.  Sepsis present on admission secondary to intra-abdominal infection as above.  Acute kidney injury.  Severe dehydration. Presented with tachycardia, leukopenia and acute renal failure. Adequately improved.  WC count normalized. Renal functions normalized.  Continue monitoring.  Hypokalemia: Severe and persistent.  On diuretic therapy at home.  Discontinue diuresis.  Replaced with improvement.  Hypophosphatemia: Replaced today.  Abnormal EKG: Atrial tachycardia.  No evidence of A. fib.  Patient asymptomatic. TSH normal.   Still intermittently tachycardic.  2D echocardiogram today.  Mobilize. Asymptomatic and no need to treat.  DVT prophylaxis:   Lovenox subcu   Code Status:  Full code Family Communication: None Disposition Plan: Status is: Inpatient  Remains inpatient appropriate because: Significant electrolyte abnormalities.  Abdominal pain and IV antibiotics.         Consultants:  General surgery  Procedures:  None  Antimicrobials:  Ceftriaxone and Flagyl 12/16---   Subjective:  Patient seen and examined.  Denies any complaints.  Denies any chest pain or palpitations.  Denies any abdominal pain or discomfort.  Passing some liquid along with flatus but no bowel movement.  Objective: Vitals:   12/16/21 1545 12/16/21 2019 12/17/21 0508 12/17/21 0828  BP: 118/86 (!) 115/91 125/88 113/87  Pulse: 95 92 80 77  Resp: 14 18 18 16   Temp: 98.6 F (37 C) 98.5 F (36.9 C) 98.3 F (36.8 C) 98 F (36.7 C)  TempSrc: Oral   Oral  SpO2: 98% 97% 92% (!) 88%  Weight:      Height:        Intake/Output Summary (Last 24 hours) at 12/17/2021 1300 Last data filed at 12/17/2021 0429 Gross per 24 hour  Intake 2116.73 ml  Output --  Net 2116.73 ml   Filed Weights   12/14/21 2021  Weight: 116.1 kg    Examination:  General exam: Appears calm and comfortable  Respiratory system: Clear to auscultation. Respiratory effort normal.  No added sounds. Cardiovascular system: S1 & S2 heard, tachycardic.  Regular. Gastrointestinal system: Obese.  Nontender.  Bowel sound present. Central nervous system: Alert and oriented. No focal neurological deficits. Extremities: Symmetric 5 x 5 power. Skin: No rashes, lesions or ulcers Psychiatry: Judgement and insight appear normal. Mood & affect appropriate.     Data Reviewed: I have personally reviewed following labs and imaging studies  CBC: Recent Labs  Lab 12/14/21 2010 12/15/21 0622 12/16/21 0930 12/17/21 0246  WBC 2.6* 12.5* 5.8 6.0  NEUTROABS 1.9  --   --   --   HGB 13.1 12.5* 11.1* 10.8*  HCT 38.4* 35.9* 32.7* 31.6*  MCV 75.3* 74.8* 76.0* 75.1*  PLT 151 171 146* [id number]*   Basic Metabolic  Panel: Recent Labs  Lab 12/15/21 0622 12/15/21 1718 12/16/21 0222 12/16/21 1734 12/17/21 0246  NA 139 140 141   141 139 140  K 2.7* 3.1* 3.9   3.8 4.0 3.5  CL 104 108 112*   112* 111 110  CO2 24 21* 19*   20* 22 24  GLUCOSE 124* 95 86   86 128* 116*  BUN 24* 22* 21*   19 18 14   CREATININE 1.94* 1.60* 1.56*   1.49* 1.26* 1.17  CALCIUM 7.9* 7.3* 7.4*   7.4* 7.7* 7.9*  MG  --  1.6*  --   --  2.1  PHOS  --   --   --  1.8*  --    GFR: Estimated Creatinine Clearance: 75.2 mL/min (by C-G formula based on SCr of 1.17 mg/dL). Liver Function Tests: Recent Labs  Lab 12/14/21 2010 12/15/21 1718 12/16/21 0222 12/16/21 1734 12/17/21 0246  AST 74* 69* 66* 66* 54*  ALT 32 36 35 37 32  ALKPHOS 119 106 96 122 121  BILITOT 2.3* 1.5* 1.3* 1.1 1.0  PROT 8.4* 7.1 6.6 7.4 6.7  ALBUMIN 3.3* 2.8* 2.8* 2.7* 2.5*   Recent Labs  Lab 12/14/21 2010  LIPASE 56*   No results for input(s): AMMONIA in the last 168 hours. Coagulation Profile: Recent Labs  Lab 12/15/21 0622  INR 1.3*   Cardiac Enzymes: No results for input(s): CKTOTAL, CKMB, CKMBINDEX, TROPONINI in the last 168 hours. BNP (last 3 results) No results for input(s): PROBNP in the last 8760 hours. HbA1C: No results for input(s): HGBA1C in the last 72 hours. CBG: Recent Labs  Lab 12/14/21 2008 12/16/21 0011  GLUCAP 203* 105*   Lipid Profile: No results for input(s): CHOL, HDL, LDLCALC, TRIG, CHOLHDL, LDLDIRECT in the last 72 hours. Thyroid Function Tests: Recent Labs    12/15/21 0622  TSH 0.822   Anemia Panel: No results for input(s): VITAMINB12, FOLATE, FERRITIN, TIBC, IRON, RETICCTPCT in the last 72 hours. Sepsis Labs: Recent Labs  Lab 12/15/21 0622 12/15/21 2007 12/15/21 2223  PROCALCITON >150.00  --   --   LATICACIDVEN  --  1.3 1.5    Recent Results (from the past 240 hour(s))  Resp Panel by RT-PCR (Flu A&B, Covid) Nasopharyngeal Swab     Status: None   Collection Time: 12/14/21  8:16 PM   Specimen:  Nasopharyngeal Swab; Nasopharyngeal(NP) swabs in vial transport medium  Result Value Ref Range Status   SARS Coronavirus 2 by RT PCR NEGATIVE NEGATIVE Final    Comment: (NOTE) SARS-CoV-2 target nucleic acids are NOT DETECTED.  The SARS-CoV-2 RNA is generally detectable in upper respiratory specimens during the acute phase of infection. The lowest concentration of SARS-CoV-2 viral copies this assay can detect is 138 copies/mL. A negative result does not preclude SARS-Cov-2 infection and should not be used as the sole basis for treatment or other patient management decisions. A negative result may occur with  improper specimen collection/handling, submission of specimen other than nasopharyngeal swab, presence of viral mutation(s) within the areas targeted by this assay, and inadequate number of viral copies(<138 copies/mL). A negative result must be combined with clinical  observations, patient history, and epidemiological information. The expected result is Negative.  Fact Sheet for Patients:  EntrepreneurPulse.com.au  Fact Sheet for Healthcare Providers:  IncredibleEmployment.be  This test is no t yet approved or cleared by the [place] FDA and  has been authorized for detection and/or diagnosis of SARS-CoV-2 by FDA under an Emergency Use Authorization (EUA). This EUA will remain  in effect (meaning this test can be used) for the duration of the COVID-19 declaration under Section 564(b)(1) of the Act, 21 U.S.C.section 360bbb-3(b)(1), unless the authorization is terminated  or revoked sooner.       Influenza A by PCR NEGATIVE NEGATIVE Final   Influenza B by PCR NEGATIVE NEGATIVE Final    Comment: (NOTE) The Xpert Xpress SARS-CoV-2/FLU/RSV plus assay is intended as an aid in the diagnosis of influenza from Nasopharyngeal swab specimens and should not be used as a sole basis for treatment. Nasal washings and aspirates are unacceptable for  Xpert Xpress SARS-CoV-2/FLU/RSV testing.  Fact Sheet for Patients: EntrepreneurPulse.com.au  Fact Sheet for Healthcare Providers: IncredibleEmployment.be  This test is not yet approved or cleared by the [place] FDA and has been authorized for detection and/or diagnosis of SARS-CoV-2 by FDA under an Emergency Use Authorization (EUA). This EUA will remain in effect (meaning this test can be used) for the duration of the COVID-19 declaration under Section 564(b)(1) of the Act, 21 U.S.C. section 360bbb-3(b)(1), unless the authorization is terminated or revoked.  Performed at [facility], [address], [place], [place] [address]          Radiology Studies: No results found.      Scheduled Meds:  enoxaparin (LOVENOX) injection  0.5 mg/kg Subcutaneous Q24H   Continuous Infusions:  cefTRIAXone (ROCEPHIN)  IV Stopped (12/17/21 [id number])   lactated ringers 100 mL/hr at 12/17/21 0429   metronidazole 500 mg (12/17/21 0602)     LOS: 3 days    Time spent: 25 minutes    Barb Merino, MD [facility] Pager [phone]

## 2021-12-17 NOTE — Progress Notes (Signed)
[facility] SURGICAL PROGRESS NOTE (cpt [phone])  Hospital Day(s): 3.   Interval History: Patient seen and examined, no acute events or new complaints overnight. Patient reports he is doing well this morning. He denies fever, chills, nausea, emesis, or abdominal pain. He remains without a leukocytosis; 6.0K. Hgb to 10.8; likely dilutional. Renal function remains normal; sCr - 1.17; UO - unmeasured. No significant electrolyte derangements. He continues on Rocephin and Flagyl. He was advanced to CLD yesterday (12/18); and tolerating well.   Review of Systems:  Constitutional: denies fever, chills  HEENT: denies cough or congestion  Respiratory: denies any shortness of breath  Cardiovascular: denies chest pain or palpitations  Gastrointestinal: denies abdominal pain, N/V Genitourinary: denies burning with urination or urinary frequency  Vital signs in last 24 hours: [min-max] current  Temp:  [98.3 F (36.8 C)-98.6 F (37 C)] 98.3 F (36.8 C) (12/19 0508) Pulse Rate:  [80-95] 80 (12/19 0508) Resp:  [14-18] 18 (12/19 0508) BP: (115-125)/(86-91) 125/88 (12/19 0508) SpO2:  [92 %-98 %] 92 % (12/19 0508)     Height: 5\' 2"  (157.5 cm) Weight: 116.1 kg     Intake/Output last 2 shifts:  12/18 0701 - 12/19 0700 In: 2116.7 [I.V.:1816.7; IV Piggyback:300] Out: -    Physical Exam:  Constitutional: alert, cooperative and no distress  HENT: normocephalic without obvious abnormality  Eyes: PERRL, EOM's grossly intact and symmetric  Respiratory: breathing non-labored at rest  Cardiovascular: regular rate and sinus rhythm  Gastrointestinal: Soft, non-tender, and non-distended, no rebound/guarding  Musculoskeletal: UE and LE FROM, no edema or wounds, motor and sensation grossly intact, NT    Labs:  CBC Latest Ref Rng & Units 12/17/2021 12/16/2021 12/15/2021  WBC 4.0 - 10.5 K/uL 6.0 5.8 12.5(H)  Hemoglobin 13.0 - 17.0 g/dL 10.8(L) 11.1(L) 12.5(L)  Hematocrit 39.0 - 52.0 % 31.6(L)  32.7(L) 35.9(L)  Platelets 150 - 400 K/uL 145(L) 146(L) 171   CMP Latest Ref Rng & Units 12/17/2021 12/16/2021 12/16/2021  Glucose 70 - 99 mg/dL 12/18/2021) 275(T) 86  BUN 6 - 20 mg/dL 14 18 700(F)  Creatinine 0.61 - 1.24 mg/dL 74(B 4.49) 6.75(F)  Sodium 135 - 145 mmol/L 140 139 141  Potassium 3.5 - 5.1 mmol/L 3.5 4.0 3.9  Chloride 98 - 111 mmol/L 110 111 112(H)  CO2 22 - 32 mmol/L 24 22 19(L)  Calcium 8.9 - 10.3 mg/dL 7.9(L) 7.7(L) 7.4(L)  Total Protein 6.5 - 8.1 g/dL 6.7 7.4 6.6  Total Bilirubin 0.3 - 1.2 mg/dL 1.0 1.1 1.63(W)  Alkaline Phos 38 - 126 U/L 121 122 96  AST 15 - 41 U/L 54(H) 66(H) 66(H)  ALT 0 - 44 U/L 32 37 35     Imaging studies: No new pertinent imaging studies   Assessment/Plan: (ICD-10's: K18.12) 60 y.o. male with admitted with small bowel diverticulitis, now with resolved abdominal pain   - Okay to continue to advance diet as tolerated; full liquids; potentially soft diet for dinner - Wean from IVF support as diet advances - Continue IV Abx (Rocephin/Flagyl)   - Monitor abdominal examination; on-going bowel function - Pain control prn; antiemetics prn - Monitor renal function; AKI resolved   - No surgical intervention   - Further management per primary service; we will follow    All of the above findings and recommendations were discussed with the patient, and the medical team, and all of patient's questions were answered to his expressed satisfaction.  -- 67, PA-C [facility] 12/17/2021, 7:43 AM [phone]  M-F: 7am - 4pm

## 2021-12-17 NOTE — TOC Initial Note (Signed)
Transition of Care [facility]) - Initial/Assessment Note    Patient Details  Name: Taisei Bonnette MRN: [id number] Date of Birth: 1961/05/18  Transition of Care [facility]) CM/SW Contact:    Chapman Fitch, RN Phone Number: 12/17/2021, 3:56 PM  Clinical Narrative:                  Transition of Care (TOC) Screening Note   Patient Details  Name: Pio Eatherly Date of Birth: June 22, 1961   Transition of Care [facility]) CM/SW Contact:    Chapman Fitch, RN Phone Number: 12/17/2021, 3:57 PM    Transition of Care [facility]) has reviewed patient and no TOC needs have been identified at this time. We will continue to monitor patient advancement through interdisciplinary progression rounds. If new patient transition needs arise, please place a TOC consult.          Patient Goals and CMS Choice        Expected Discharge Plan and Services                                                Prior Living Arrangements/Services                       Activities of Daily Living Home Assistive Devices/Equipment: None ADL Screening (condition at time of admission) Patient's cognitive ability adequate to safely complete daily activities?: Yes Is the patient deaf or have difficulty hearing?: No Does the patient have difficulty seeing, even when wearing glasses/contacts?: No Does the patient have difficulty concentrating, remembering, or making decisions?: No Patient able to express need for assistance with ADLs?: Yes Does the patient have difficulty dressing or bathing?: No Independently performs ADLs?: Yes (appropriate for developmental age) Does the patient have difficulty walking or climbing stairs?: No Weakness of Legs: None Weakness of Arms/Hands: None  Permission Sought/Granted                  Emotional Assessment              Admission diagnosis:  Diverticulitis of small bowel [K57.12] Intra-abdominal infection [B99.9] Abnormal white blood cell  (WBC) count [D72.9] Patient Active Problem List   Diagnosis Date Noted   Sepsis (HCC) 12/16/2021   AKI (acute kidney injury) (HCC) 12/16/2021   Hypokalemia 12/16/2021   Hyponatremia 12/16/2021   Elevated troponin 12/16/2021   Transaminitis 12/16/2021   Abnormal EKG 12/16/2021   Essential hypertension 12/16/2021   Diverticulitis of small bowel 12/14/2021   PCP:  Ellen Henri, MD Pharmacy:   [facility] [address] [place] - [address] [place] [address] Phone: [phone] Fax: [phone]     Social Determinants of Health (SDOH) Interventions    Readmission Risk Interventions No flowsheet data found.

## 2021-12-18 ENCOUNTER — Inpatient Hospital Stay (HOSPITAL_COMMUNITY)
Admit: 2021-12-18 | Discharge: 2021-12-18 | Disposition: A | Discharge status: Home / Self Care | Payer: BC Managed Care – PPO | Attending: Internal Medicine | Admitting: Internal Medicine

## 2021-12-18 ENCOUNTER — Encounter: Payer: Self-pay | Admitting: Family Medicine

## 2021-12-18 DIAGNOSIS — I5022 Chronic systolic (congestive) heart failure: Secondary | ICD-10-CM

## 2021-12-18 DIAGNOSIS — R9431 Abnormal electrocardiogram [ECG] [EKG]: Secondary | ICD-10-CM

## 2021-12-18 LAB — BASIC METABOLIC PANEL
Anion gap: 6 (ref 5–15)
BUN: 10 mg/dL (ref 6–20)
CO2: 23 mmol/L (ref 22–32)
Calcium: 8.1 mg/dL — ABNORMAL LOW (ref 8.9–10.3)
Chloride: 108 mmol/L (ref 98–111)
Creatinine, Ser: 1.09 mg/dL (ref 0.61–1.24)
GFR, Estimated: >60 mL/min (ref >60)
Glucose, Bld: 123 mg/dL — ABNORMAL HIGH (ref 70–99)
Potassium: 3.4 mmol/L — ABNORMAL LOW (ref 3.5–5.1)
Sodium: 137 mmol/L (ref 135–145)

## 2021-12-18 LAB — ECHOCARDIOGRAM COMPLETE
AR max vel: 2.17 cm2
AV Area VTI: 2.5 cm2
AV Area mean vel: 2.27 cm2
AV Mean grad: 3 mmHg
AV Peak grad: 4 mmHg
Ao pk vel: 1 m/s
Area-P 1/2: 5.75 cm2
Calc EF: 23.3 %
Height: 62 in
S' Lateral: 4.6 cm
Single Plane A2C EF: 24.2 %
Single Plane A4C EF: 22.5 %
Weight: 4096 oz

## 2021-12-18 LAB — MAGNESIUM: Magnesium: 2 mg/dL (ref 1.7–2.4)

## 2021-12-18 MED ORDER — LOSARTAN POTASSIUM 25 MG PO TABS
12.5000 mg | ORAL_TABLET | Freq: Every day | ORAL | Status: DC
  Administered 2021-12-18 – 2021-12-19 (×2): 12.5 mg via ORAL
  Filled 2021-12-18 (×2): qty 1

## 2021-12-18 MED ORDER — SPIRONOLACTONE 25 MG PO TABS
12.5000 mg | ORAL_TABLET | Freq: Every day | ORAL | Status: DC
  Administered 2021-12-18 – 2021-12-19 (×2): 12.5 mg via ORAL
  Filled 2021-12-18: qty 0.5
  Filled 2021-12-18 (×2): qty 1
  Filled 2021-12-18: qty 0.5

## 2021-12-18 MED ORDER — POTASSIUM CHLORIDE CRYS ER 20 MEQ PO TBCR
40.0000 meq | EXTENDED_RELEASE_TABLET | Freq: Once | ORAL | Status: AC
  Administered 2021-12-18: 12:00:00 40 meq via ORAL
  Filled 2021-12-18: qty 2

## 2021-12-18 MED ORDER — METOPROLOL TARTRATE 25 MG PO TABS
25.0000 mg | ORAL_TABLET | Freq: Two times a day (BID) | ORAL | Status: DC
  Administered 2021-12-18: 10:00:00 25 mg via ORAL
  Filled 2021-12-18: qty 1

## 2021-12-18 MED ORDER — AMOXICILLIN-POT CLAVULANATE 875-125 MG PO TABS
1.0000 | ORAL_TABLET | Freq: Two times a day (BID) | ORAL | Status: DC
  Administered 2021-12-18 – 2021-12-19 (×2): 1 via ORAL
  Filled 2021-12-18 (×2): qty 1

## 2021-12-18 MED ORDER — CARVEDILOL 6.25 MG PO TABS
3.1250 mg | ORAL_TABLET | Freq: Two times a day (BID) | ORAL | Status: DC
  Administered 2021-12-18 – 2021-12-19 (×2): 3.125 mg via ORAL
  Filled 2021-12-18 (×2): qty 1

## 2021-12-18 MED ORDER — AMOXICILLIN-POT CLAVULANATE 875-125 MG PO TABS: 1.0000 | ORAL_TABLET | Freq: Two times a day (BID) | ORAL | Status: DC

## 2021-12-18 NOTE — Consult Note (Addendum)
Cardiology Consult    Patient ID: Steven Pham MRN: [id number], DOB/AGE: [birth year]   Admit date: 12/14/2021 Date of Consult: 12/18/2021  Primary Physician: Steven Bushy, MD Primary Cardiologist: Steven Sacramento, MD Requesting Provider: Raelyn Mora, MD  Patient Profile    Steven Pham is a 60 y.o. male with a history of HTN, OA, and gout, who is being seen today for the evaluation of NSVT, PSVT, and newly found dilated cardiomyopathy at the request of Steven Pham.  Past Medical History   Past Medical History:  Diagnosis Date   Arthritis    Cardiomyopathy (Steven Pham)    a. 11/2021 Echo: EF 20-25%, glob HK. GrIII DD. Nl RV size/fxn. Mildly elev PASP. Sev dil LA, mildly dil RA. Mild to mod MR.   Gout    Hypertension    NSVT (nonsustained ventricular tachycardia) 11/2021   PSVT (paroxysmal supraventricular tachycardia) (Steven Pham) 11/2021    History reviewed. No pertinent surgical history.   Allergies  No Known Allergies  History of Present Illness    60 y/o ? w/ a h/o HTN, OA, and Gout.  He has no prior cardiac hx and denies any FH of premature CAD.  He previously drank alcohol, but none in 20+ years.  He lives locally by himself and works at [employer], where he notes that his job requires quite a bit of walking/exertion, which he has always tolerated w/o symptoms or limitations.  He was in his USOH until the morning of 12/15, when he awoke with mild abdominal discomfort.  He had eaten a [employer] pizza the night before and thought that he had just upset his stomach.  Mild discomfort persisted throughout the day and on 12/16, he noted diarrhea and worsening abd discomfort.  After work on 12/16, due to progression of pain/tenderness, and development of n/v/diaphoresis, he presented to the [facility] ED for eval.  Here, he was found to be tachycardic @ 131 w/ biatrial enlargement, LVH, and TWI in I, aVL, and V2.  ED note indicates that pt reported intermittent elevations in HR, though today,  he denies any h/o palpiations.  Labs notable for hypokalemia (2.8), AKI (creat 1.85), and elevated HsTrops @ 60  81  113  72.  CT abd/pelvis consistent w/ small bowel diverticulitis w/ inflammatory changes in the mesentery in the L mid abdomen as well as some very early venous air adjacent to the small bowel loops.  He was seen by general surgery w/ recommendation for conservative mgmt  Abx/IVF/NPO.    Following admission, Steven Pham has noted steady improvement in abdominal discomfort, resolution of n/v, and normalization of stools.  Renal fxn has improved.  Throughout admission, he has been intermittently tachycardic w/ runs of SVT as well as NSVT.  An echo was performed 12/19, revealing EF of 20-25%, global HK, GrIII diast dysfxn, nl RV fxn, sev dil LA, and mild to mod MR.  We've been asked to eval. Pt has no complaints this AM and denies any prior h/o chest pain, dyspnea, palpitations, presyncope, or syncope.  Inpatient Medications     amoxicillin-clavulanate  1 tablet Oral Q12H   enoxaparin (LOVENOX) injection  0.5 mg/kg Subcutaneous Q24H   metoprolol tartrate  25 mg Oral BID    Family History    No family history on file. He indicated that his mother is deceased. He indicated that his father is deceased.   Social History    Social History   Socioeconomic History   Marital status: Single  Spouse name: Not on file   Number of children: Not on file   Years of education: Not on file   Highest education level: Not on file  Occupational History   Not on file  Tobacco Use   Smoking status: Never   Smokeless tobacco: Never  Substance and Sexual Activity   Alcohol use: No   Drug use: No   Sexual activity: Not on file  Other Topics Concern   Not on file  Social History Narrative   Lives locally by himself.  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  Does not routinely exercise.   Social Determinants of Health   Financial Resource Strain: Not on file  Food Insecurity: Not on file   Transportation Needs: Not on file  Physical Activity: Not on file  Stress: Not on file  Social Connections: Not on file  Intimate Partner Violence: Not on file     Review of Systems    General:  No chills, fever, night sweats or weight changes.  Cardiovascular:  No chest pain, dyspnea on exertion, edema, orthopnea, palpitations, paroxysmal nocturnal dyspnea. Dermatological: No rash, lesions/masses Respiratory: No cough, dyspnea Urologic: No hematuria, dysuria Abdominal:   +++ nausea, +++ vomiting, +++ diarrhea, +++ abd pain.  No bright red blood per rectum, melena, or hematemesis Neurologic:  No visual changes, wkns, changes in mental status. All other systems reviewed and are otherwise negative except as noted above.  Physical Exam    Blood pressure 103/90, pulse 86, temperature 98.4 F (36.9 C), resp. rate 20, height 5\' 2"  (1.575 m), weight 116.1 kg, SpO2 98 %.  General: Pleasant, NAD Psych: Normal affect. Neuro: Alert and oriented X 3. Moves all extremities spontaneously. HEENT: Normal  Neck: Supple without bruits or JVD. Lungs:  Resp regular and unlabored, CTA. Heart: RRR no s3, s4, or murmurs. Abdomen: Soft, non-tender, non-distended, BS + x 4.  Extremities: No clubbing, cyanosis or edema. DP/PT2+, Radials 2+ and equal bilaterally.  Labs    Cardiac Enzymes Recent Labs  Lab 12/14/21 2010 12/14/21 2233 12/15/21 0622 12/16/21 0658  TROPONINIHS 60* 81* 113* 72*      Lab Results  Component Value Date   WBC 6.0 12/17/2021   HGB 10.8 (L) 12/17/2021   HCT 31.6 (L) 12/17/2021   MCV 75.1 (L) 12/17/2021   PLT 145 (L) 12/17/2021    Recent Labs  Lab 12/17/21 0246 12/18/21 0329  NA 140 137  K 3.5 3.4*  CL 110 108  CO2 24 23  BUN 14 10  CREATININE 1.17 1.09  CALCIUM 7.9* 8.1*  PROT 6.7  --   BILITOT 1.0  --   ALKPHOS 121  --   ALT 32  --   AST 54*  --   GLUCOSE 116* 123*     Radiology Studies    DG Chest 1 View  Result Date: 12/14/2021 CLINICAL  DATA:  Upper abdominal pain and vomiting. EXAM: CHEST  1 VIEW COMPARISON:  May 13, 2021 FINDINGS: Very mild, stable atelectasis is seen within the bilateral lung bases. There is no evidence of a pleural effusion or pneumothorax. The heart size and mediastinal contours are within normal limits. The visualized skeletal structures are unremarkable. IMPRESSION: Very mild bibasilar atelectasis. Electronically Signed   By: Aram Candela M.D.   On: 12/14/2021 20:34   CT ABDOMEN PELVIS W CONTRAST  Result Date: 12/14/2021 CLINICAL DATA:  Upper abdominal pain for 2 days EXAM: CT ABDOMEN AND PELVIS WITH CONTRAST TECHNIQUE: Multidetector CT imaging of  the abdomen and pelvis was performed using the standard protocol following bolus administration of intravenous contrast. CONTRAST:  52mL OMNIPAQUE IOHEXOL 300 MG/ML  SOLN COMPARISON:  None. FINDINGS: Lower chest: No acute abnormality. Hepatobiliary: No focal liver abnormality is seen. No gallstones, gallbladder wall thickening, or biliary dilatation. Pancreas: Unremarkable. No pancreatic ductal dilatation or surrounding inflammatory changes. Spleen: Normal in size without focal abnormality. Adrenals/Urinary Tract: Adrenal glands are within normal limits. Kidneys demonstrate a normal enhancement pattern bilaterally. Large simple cyst is noted arising from the lower pole of the left kidney measuring 5 cm in greatest dimension. No obstructive changes are seen. The ureters are within normal limits. The bladder is partially distended. Stomach/Bowel: No obstructive or inflammatory changes of colon are seen. The appendix is within normal limits. Stomach is within normal limits. There are multiple fluid-filled outpouchings in the mid jejunum consistent with small bowel diverticular change. Some associated inflammatory changes are noted in the adjacent mesentery with findings suggestive of early venous air related to the inflammatory change. No definitive portal venous air is  noted. Vascular/Lymphatic: Atherosclerotic calcifications are seen. Some inflammatory changes are noted in the root of the small bowel mesentery in the left mid abdomen as described above. Some linear areas of air are noted in the inflamed mesentery and the possibility of early SMV air could not be totally excluded. Reproductive: Prostate is unremarkable. Other: Small fat containing umbilical hernia is noted. No abdominopelvic ascites. Musculoskeletal: No acute or significant osseous findings. IMPRESSION: Changes consistent with small bowel diverticulitis with inflammatory changes in the mesentery in the left mid abdomen as well as some very early venous air adjacent to the small bowel loops. Left renal cyst. Critical Value/emergent results were called by telephone at the time of interpretation on 12/14/2021 at 10:34 pm to Dr. Conni Slipper , who verbally acknowledged these results. Electronically Signed   By: Inez Catalina M.D.   On: 12/14/2021 22:38   ECG & Cardiac Imaging    12/16 - sinus tachycardia, 131, LAD, BAE, LVH, TWI I, aVL, V2 - personally reviewed. 12/17 0854 - SVT 159, PVCs, LAD 12/16/21 0248 - sinus rhythm, 98, LAD, BAE, LVH, nonspecific T changes.  Assessment & Plan    1.  Dilated cardiomyopathy:  Pt w/o prior cardiac hx, admitted 12/17 w/ diverticulitis and incidentally noted to have sinus tachycardia followed by intermittent runs of SVT as well as NSVT.  All arrhythmias have been asymptomatic.  HsTrops mildly elevated w/ flat trend @ 60  81  113  72.  He denies any prior h/o chest pain, dyspnea, palpitations, or presyncope/syncope.  Echo performed 12/19 w/ EF of 20-25%, glob HK, GrIII diast dysfxn, nl RV fxn, sev dil LA, and mild to mod MR.  We have been asked to eval.  Currently asymptomatic.  ? blocker has been to his regimen and in light of LV dysfxn, we will change to carvedilol.  I will also add low dose losartan and spironolactone.  Follow BP and telemetry.  He will need an ischemic  eval (right and left heart cath), though given asymptomatic nature of cardiomyopathy and likelihood that this may be longstanding, this can likely be performed as an outpatient within the next few wks, following full recovery from diverticulitis and completion of abx.    2.  Diverticulitis:  Feels much better.  No abd pain.  Seen by [facility] w/ rec for conservative mgmt.  Abx per primary team.   3.  Essential HTN:  BP softer today.  Switching ?  blocker to carevedilol and will add low dose arb/spiro.  Follow.  4.  PSVT/NSVT:  in setting of above, pt w/ frequent runs of SVT and NSVT up to 10 beats.  All arrhythmias have been asymptomatic and he denies any prior h/o presyncope/syncope.  ? blocker added.  Plan ischemic eval as above.  Supp K.  Will likely need outpt EP eval.  5.  Demand Ischemia:  In setting of diverticulitis and tachycardia, HsTrops are mildly elevated w/ relatively flat trend of 60  81  113  72.  No prior h/o chest pain or dyspnea.  Plan cath once fully recovered from diverticulitis.  Will add ASA/statin.  6.  AKI:  resolved.    7.  Hypokalemia/Hypomagnesemia:  K 3.4 this AM.  Mg was 1.6 on admission, but is nl @ 2.0 this AM.  Supp K in setting of arrhythmias.  8.  Microcytic anemia:  H/H drifting down during admission. Pt denies melena/brbpr.    Signed, Nicolasa Ducking, NP 12/18/2021, 2:18 PM  For questions or updates, please contact   Please consult [email or web address] for contact info under Cardiology/STEMI.

## 2021-12-18 NOTE — Progress Notes (Signed)
Dr. Toniann Fail notified of CCMD report of 6 beats run of VT; telemetry renewed (pt. Never taken off); labs ordered; asymptomatic; will continue to monitor. Windy Carina, RN 3:26 AM 12/18/2021

## 2021-12-18 NOTE — Progress Notes (Signed)
HR keeps jumping between 160 and 170.  Has no PRN to bring it down.  Right now, it went down to 84.

## 2021-12-18 NOTE — Progress Notes (Signed)
[facility] SURGICAL PROGRESS NOTE (cpt [phone])  Hospital Day(s): 4.   Interval History: Patient seen and examined, no acute events or new complaints overnight. Patient reports he continues to do well. He denies fever, chills, nausea, emesis. Renal function normal; sCr - 1.09; UO - 1.4L. Mild hypokalemia to 3.4 o/w no significant electrolyte derangements. He continues on Rocephin and Flagyl. Diet advanced to soft diet which he has tolerated well.   Review of Systems:  Constitutional: denies fever, chills  HEENT: denies cough or congestion  Respiratory: denies any shortness of breath  Cardiovascular: denies chest pain or palpitations  Gastrointestinal: denies abdominal pain, N/V Genitourinary: denies burning with urination or urinary frequency  Vital signs in last 24 hours: [min-max] current  Temp:  [97.7 F (36.5 C)-99.5 F (37.5 C)] 98.6 F (37 C) (12/20 0542) Pulse Rate:  [77-97] 79 (12/20 0542) Resp:  [16-18] 18 (12/20 0542) BP: (113-128)/(79-114) 116/79 (12/20 0542) SpO2:  [88 %-99 %] 94 % (12/20 0542)     Height: 5\' 2"  (157.5 cm) Weight: 116.1 kg     Intake/Output last 2 shifts:  12/19 0701 - 12/20 0700 In: 2472.9 [P.O.:540; I.V.:998.9; IV Piggyback:933.9] Out: 1400 [Urine:1400]   Physical Exam:  Constitutional: alert, cooperative and no distress  HENT: normocephalic without obvious abnormality  Eyes: PERRL, EOM's grossly intact and symmetric  Respiratory: breathing non-labored at rest  Cardiovascular: regular rate and sinus rhythm  Gastrointestinal: Soft, non-tender, and non-distended, no rebound/guarding  Musculoskeletal: UE and LE FROM, no edema or wounds, motor and sensation grossly intact, NT    Labs:  CBC Latest Ref Rng & Units 12/17/2021 12/16/2021 12/15/2021  WBC 4.0 - 10.5 K/uL 6.0 5.8 12.5(H)  Hemoglobin 13.0 - 17.0 g/dL 10.8(L) 11.1(L) 12.5(L)  Hematocrit 39.0 - 52.0 % 31.6(L) 32.7(L) 35.9(L)  Platelets 150 - 400 K/uL 145(L) 146(L) 171    CMP Latest Ref Rng & Units 12/18/2021 12/17/2021 12/16/2021  Glucose 70 - 99 mg/dL 12/18/2021) 742(V) 956(L)  BUN 6 - 20 mg/dL 10 14 18   Creatinine 0.61 - 1.24 mg/dL 875(I 4.33)  Sodium 135 - 145 mmol/L 137 140 139  Potassium 3.5 - 5.1 mmol/L 3.4(L) 3.5 4.0  Chloride 98 - 111 mmol/L 108 110 111  CO2 22 - 32 mmol/L 23 24 22   Calcium 8.9 - 10.3 mg/dL 8.1(L) 7.9(L) 7.7(L)  Total Protein 6.5 - 8.1 g/dL - 6.7 7.4  Total Bilirubin 0.3 - 1.2 mg/dL - 1.0 1.1  Alkaline Phos 38 - 126 U/L - 121 122  AST 15 - 41 U/L - 54(H) 66(H)  ALT 0 - 44 U/L - 32 37    Imaging studies: No new pertinent imaging studies   Assessment/Plan: (ICD-10's: K51.12) 60 y.o. male with admitted with small bowel diverticulitis, now with resolved abdominal pain   - Okay to continue regular diet as tolerated - Continue IV Abx (Rocephin/Flagyl); recommend PO Abx for home to complete 10 days total (IV + PO)             - Monitor abdominal examination; on-going bowel function - Pain control prn; antiemetics prn - Monitor renal function; AKI resolved              - No surgical intervention              - Further management per primary service  - Discharge Planning: He is stable for discharge from a surgical perspective, Abx as above. I will place follow up for 1 month to ensure he  does well at home.     All of the above findings and recommendations were discussed with the patient, and the medical team, and all of patient's questions were answered to his expressed satisfaction.  -- Edison Simon, PA-C [facility] 12/18/2021, 7:35 AM [phone] M-F: 7am - 4pm

## 2021-12-18 NOTE — Progress Notes (Signed)
Walked patient.  The lowest HR with ambulation was 44 and highest 55. At rest HR shot up to 144 and came down to 90. He tolerated ambulation well without any symptoms.

## 2021-12-18 NOTE — Progress Notes (Signed)
PROGRESS NOTE    Steven Pham  [id number] DOB: [birth year] DOA: 12/14/2021 PCP: Ellen Henri, MD    Brief Narrative:  60 year old gentleman with history of osteoarthritis, gout and hypertension on thiazide and Lasix presented to emergency room with acute onset of left-sided abdominal pain, dizziness and lightheadedness with some nausea and vomiting and chills. In the emergency room tachycardic, severely hypokalemic and hyponatremic, AKI.  COVID-19 and influenza negative.  Leukopenic.  CT scan abdomen with small bowel diverticulitis with some inflammatory changes.  Surgery consulted and admission to the medical floor.  Abdominal symptoms improved.  Patient continues to have sinus tachycardia and PVCs.  An echocardiogram done today shows severely depressed ejection fraction of 20 to 25% along with grade 3 diastolic dysfunction.   Assessment & Plan:   Principal Problem:   Diverticulitis of small bowel Active Problems:   Sepsis (HCC)   AKI (acute kidney injury) (HCC)   Hypokalemia   Hyponatremia   Elevated troponin   Transaminitis   Abnormal EKG   Essential hypertension  Diverticulitis of the small intestines: Clinically improved.  Normal bowel function.  Treated with Rocephin and Flagyl.  Tolerating regular diet.  Changing to Augmentin today to complete 2 weeks of therapy.  Seen by surgery.  They will follow-up outpatient.  Sepsis present on admission secondary to intra-abdominal infection as above.  Acute kidney injury.  Severe dehydration. Presented with tachycardia, leukopenia and acute renal failure. Adequately improved.  WBC count normalized. Renal functions normalized.   Hypokalemia: Severe and persistent.  On diuretic therapy at home.  Replaced aggressively with improvement.  Hypophosphatemia: Improved.  Abnormal EKG: Atrial tachycardia.  No evidence of A. fib.  Patient asymptomatic. TSH normal.   Still intermittently tachycardic.  Started on metoprolol 25 mg twice  daily. Echocardiogram today consistent with ejection fraction 20 to 25%, grade 3 diastolic dysfunction.  Patient is on room air. Tachycardia mediated congestive heart failure or ischemic heart disease. Hold off on diuretics today. Will consult cardiology for evaluation, may need ischemic evaluation.   DVT prophylaxis:   Lovenox subcu   Code Status: Full code Family Communication: None Disposition Plan: Status is: Inpatient  Remains inpatient appropriate because: New onset congestive heart failure.       Consultants:  General surgery Cardiology  Procedures:  None  Antimicrobials:  Ceftriaxone and Flagyl 12/16--- 12/20 Augmentin 12/20---   Subjective:  Patient seen and examined.  Up about walking in the hallway.  Denies any nausea vomiting or abdominal pain.  Denies any tachycardia or chest pain. His heart rate will go up to 170 and spontaneously normalized. Echocardiogram as above.  Objective: Vitals:   12/18/21 0812 12/18/21 0917 12/18/21 0920 12/18/21 1207  BP: 114/86 124/88 (!) 124/107 103/90  Pulse: 82 (!) 170 80 86  Resp: 18 20 20 20   Temp: 98.1 F (36.7 C)   98.4 F (36.9 C)  TempSrc: Oral     SpO2: 97% 97% 96% 98%  Weight:      Height:        Intake/Output Summary (Last 24 hours) at 12/18/2021 1334 Last data filed at 12/18/2021 0806 Gross per 24 hour  Intake 1717.37 ml  Output 1400 ml  Net 317.37 ml   Filed Weights   12/14/21 2021  Weight: 116.1 kg    Examination:  General exam: Appears calm and comfortable , walking around in the hallway. Respiratory system: Clear to auscultation. Respiratory effort normal.  No added sounds. Cardiovascular system: S1 & S2 heard, tachycardic.  Regular.  Gastrointestinal system: Obese.  Nontender.  Bowel sound present. Central nervous system: Alert and oriented. No focal neurological deficits. Extremities: Symmetric 5 x 5 power. Skin: No rashes, lesions or ulcers Psychiatry: Judgement and insight appear  normal. Mood & affect appropriate.     Data Reviewed: I have personally reviewed following labs and imaging studies  CBC: Recent Labs  Lab 12/14/21 2010 12/15/21 0622 12/16/21 0930 12/17/21 0246  WBC 2.6* 12.5* 5.8 6.0  NEUTROABS 1.9  --   --   --   HGB 13.1 12.5* 11.1* 10.8*  HCT 38.4* 35.9* 32.7* 31.6*  MCV 75.3* 74.8* 76.0* 75.1*  PLT 151 171 146* 145*   Basic Metabolic Panel: Recent Labs  Lab 12/15/21 1718 12/16/21 0222 12/16/21 1734 12/17/21 0246 12/18/21 0329  NA 140 141   141 139 140 137  K 3.1* 3.9   3.8 4.0 3.5 3.4*  CL 108 112*   112* 111 110 108  CO2 21* 19*   20* 22 24 23   GLUCOSE 95 86   86 128* 116* 123*  BUN 22* 21*   19 18 14 10   CREATININE 1.60* 1.56*   1.49* 1.26* 1.17 1.09  CALCIUM 7.3* 7.4*   7.4* 7.7* 7.9* 8.1*  MG 1.6*  --   --  2.1 2.0  PHOS  --   --  1.8*  --   --    GFR: Estimated Creatinine Clearance: 80.7 mL/min (by C-G formula based on SCr of 1.09 mg/dL). Liver Function Tests: Recent Labs  Lab 12/14/21 2010 12/15/21 1718 12/16/21 0222 12/16/21 1734 12/17/21 0246  AST 74* 69* 66* 66* 54*  ALT 32 36 35 37 32  ALKPHOS 119 106 96 122 121  BILITOT 2.3* 1.5* 1.3* 1.1 1.0  PROT 8.4* 7.1 6.6 7.4 6.7  ALBUMIN 3.3* 2.8* 2.8* 2.7* 2.5*   Recent Labs  Lab 12/14/21 2010  LIPASE 56*   No results for input(s): AMMONIA in the last 168 hours. Coagulation Profile: Recent Labs  Lab 12/15/21 0622  INR 1.3*   Cardiac Enzymes: No results for input(s): CKTOTAL, CKMB, CKMBINDEX, TROPONINI in the last 168 hours. BNP (last 3 results) No results for input(s): PROBNP in the last 8760 hours. HbA1C: No results for input(s): HGBA1C in the last 72 hours. CBG: Recent Labs  Lab 12/14/21 2008 12/16/21 0011  GLUCAP 203* 105*   Lipid Profile: No results for input(s): CHOL, HDL, LDLCALC, TRIG, CHOLHDL, LDLDIRECT in the last 72 hours. Thyroid Function Tests: No results for input(s): TSH, T4TOTAL, FREET4, T3FREE, THYROIDAB in the last 72  hours.  Anemia Panel: No results for input(s): VITAMINB12, FOLATE, FERRITIN, TIBC, IRON, RETICCTPCT in the last 72 hours. Sepsis Labs: Recent Labs  Lab 12/15/21 0622 12/15/21 2007 12/15/21 2223  PROCALCITON >150.00  --   --   LATICACIDVEN  --  1.3 1.5    Recent Results (from the past 240 hour(s))  Resp Panel by RT-PCR (Flu A&B, Covid) Nasopharyngeal Swab     Status: None   Collection Time: 12/14/21  8:16 PM   Specimen: Nasopharyngeal Swab; Nasopharyngeal(NP) swabs in vial transport medium  Result Value Ref Range Status   SARS Coronavirus 2 by RT PCR NEGATIVE NEGATIVE Final    Comment: (NOTE) SARS-CoV-2 target nucleic acids are NOT DETECTED.  The SARS-CoV-2 RNA is generally detectable in upper respiratory specimens during the acute phase of infection. The lowest concentration of SARS-CoV-2 viral copies this assay can detect is 138 copies/mL. A negative result does not preclude SARS-Cov-2 infection and  should not be used as the sole basis for treatment or other patient management decisions. A negative result may occur with  improper specimen collection/handling, submission of specimen other than nasopharyngeal swab, presence of viral mutation(s) within the areas targeted by this assay, and inadequate number of viral copies(<138 copies/mL). A negative result must be combined with clinical observations, patient history, and epidemiological information. The expected result is Negative.  Fact Sheet for Patients:  EntrepreneurPulse.com.au  Fact Sheet for Healthcare Providers:  IncredibleEmployment.be  This test is no t yet approved or cleared by the [place] FDA and  has been authorized for detection and/or diagnosis of SARS-CoV-2 by FDA under an Emergency Use Authorization (EUA). This EUA will remain  in effect (meaning this test can be used) for the duration of the COVID-19 declaration under Section 564(b)(1) of the Act,  21 U.S.C.section 360bbb-3(b)(1), unless the authorization is terminated  or revoked sooner.       Influenza A by PCR NEGATIVE NEGATIVE Final   Influenza B by PCR NEGATIVE NEGATIVE Final    Comment: (NOTE) The Xpert Xpress SARS-CoV-2/FLU/RSV plus assay is intended as an aid in the diagnosis of influenza from Nasopharyngeal swab specimens and should not be used as a sole basis for treatment. Nasal washings and aspirates are unacceptable for Xpert Xpress SARS-CoV-2/FLU/RSV testing.  Fact Sheet for Patients: EntrepreneurPulse.com.au  Fact Sheet for Healthcare Providers: IncredibleEmployment.be  This test is not yet approved or cleared by the [place] FDA and has been authorized for detection and/or diagnosis of SARS-CoV-2 by FDA under an Emergency Use Authorization (EUA). This EUA will remain in effect (meaning this test can be used) for the duration of the COVID-19 declaration under Section 564(b)(1) of the Act, 21 U.S.C. section 360bbb-3(b)(1), unless the authorization is terminated or revoked.  Performed at [facility], [address] [place], [place] [address]          Radiology Studies: ECHOCARDIOGRAM COMPLETE  Result Date: 12/18/2021    ECHOCARDIOGRAM REPORT   Patient Name:   Steven Pham Date of Exam: 12/18/2021 Medical Rec #:  [id number]    Height:       62.0 in Accession #:    [id number]   Weight:       256.0 lb Date of Birth:  Aug 12, 1961    BSA:          2.123 m Patient Age:    25 years     BP:           114/86 mmHg Patient Gender: M            HR:           82 bpm. Exam Location:  [facility] Procedure: 2D Echo, Cardiac Doppler and Color Doppler Indications:     Abnormal ECG R 94.31  History:         Patient has no prior history of Echocardiogram examinations.                  Risk Factors:Hypertension.  Sonographer:     Sherrie Sport Referring Phys:  [id number] Barb Merino Diagnosing Phys: Kathlyn Sacramento MD  Sonographer  Comments: Suboptimal apical window. IMPRESSIONS  1. Left ventricular ejection fraction, by estimation, is 20 to 25%. The left ventricle has severely decreased function. The left ventricle demonstrates global hypokinesis. The left ventricular internal cavity size was mildly dilated. There is mild left ventricular hypertrophy. Left ventricular diastolic parameters are consistent with Grade III diastolic dysfunction (restrictive).  2. Right ventricular  systolic function is normal. The right ventricular size is normal. There is mildly elevated pulmonary artery systolic pressure.  3. Left atrial size was severely dilated.  4. Right atrial size was mildly dilated.  5. The mitral valve is normal in structure. Mild to moderate mitral valve regurgitation. No evidence of mitral stenosis.  6. The aortic valve is normal in structure. Aortic valve regurgitation is not visualized. No aortic stenosis is present.  7. The inferior vena cava is normal in size with greater than 50% respiratory variability, suggesting right atrial pressure of 3 mmHg. FINDINGS  Left Ventricle: Left ventricular ejection fraction, by estimation, is 20 to 25%. The left ventricle has severely decreased function. The left ventricle demonstrates global hypokinesis. The left ventricular internal cavity size was mildly dilated. There is mild left ventricular hypertrophy. Left ventricular diastolic parameters are consistent with Grade III diastolic dysfunction (restrictive). Right Ventricle: The right ventricular size is normal. No increase in right ventricular wall thickness. Right ventricular systolic function is normal. There is mildly elevated pulmonary artery systolic pressure. The tricuspid regurgitant velocity is 3.22  m/s, and with an assumed right atrial pressure of 3 mmHg, the estimated right ventricular systolic pressure is [id number]. Left Atrium: Left atrial size was severely dilated. Right Atrium: Right atrial size was mildly dilated. Pericardium:  There is no evidence of pericardial effusion. Mitral Valve: The mitral valve is normal in structure. Mild to moderate mitral valve regurgitation. No evidence of mitral valve stenosis. Tricuspid Valve: The tricuspid valve is normal in structure. Tricuspid valve regurgitation is mild . No evidence of tricuspid stenosis. Aortic Valve: The aortic valve is normal in structure. Aortic valve regurgitation is not visualized. No aortic stenosis is present. Aortic valve mean gradient measures 3.0 mmHg. Aortic valve peak gradient measures 4.0 mmHg. Aortic valve area, by VTI measures 2.50 cm. Pulmonic Valve: The pulmonic valve was normal in structure. Pulmonic valve regurgitation is not visualized. No evidence of pulmonic stenosis. Aorta: The aortic root is normal in size and structure. Venous: The inferior vena cava is normal in size with greater than 50% respiratory variability, suggesting right atrial pressure of 3 mmHg. IAS/Shunts: No atrial level shunt detected by color flow Doppler.  LEFT VENTRICLE PLAX 2D LVIDd:         5.00 cm      Diastology LVIDs:         4.60 cm      LV e' medial:    4.35 cm/s LV PW:         1.50 cm      LV E/e' medial:  22.4 LV IVS:        1.10 cm      LV e' lateral:   6.53 cm/s LVOT diam:     2.10 cm      LV E/e' lateral: 14.9 LV SV:         44 LV SV Index:   21 LVOT Area:     3.46 cm  LV Volumes (MOD) LV vol d, MOD A2C: 190.0 ml LV vol d, MOD A4C: 213.0 ml LV vol s, MOD A2C: 144.0 ml LV vol s, MOD A4C: 165.0 ml LV SV MOD A2C:     46.0 ml LV SV MOD A4C:     213.0 ml LV SV MOD BP:      46.4 ml RIGHT VENTRICLE RV Basal diam:  4.30 cm TAPSE (M-mode): 4.2 cm LEFT ATRIUM  Index        RIGHT ATRIUM           Index LA diam:        5.10 cm  2.40 cm/m   RA Area:     23.90 cm LA Vol (A2C):   91.9 ml  43.28 ml/m  RA Volume:   67.40 ml  31.75 ml/m LA Vol (A4C):   146.0 ml 68.77 ml/m LA Biplane Vol: 116.0 ml 54.64 ml/m  AORTIC VALVE                    PULMONIC VALVE AV Area (Vmax):    2.17  cm     PV Vmax:        0.68 m/s AV Area (Vmean):   2.27 cm     PV Vmean:       42.100 cm/s AV Area (VTI):     2.50 cm     PV VTI:         0.088 m AV Vmax:           100.00 cm/s  PV Peak grad:   1.8 mmHg AV Vmean:          76.200 cm/s  PV Mean grad:   1.0 mmHg AV VTI:            0.176 m      RVOT Peak grad: 4 mmHg AV Peak Grad:      4.0 mmHg AV Mean Grad:      3.0 mmHg LVOT Vmax:         62.70 cm/s LVOT Vmean:        50.000 cm/s LVOT VTI:          0.127 m LVOT/AV VTI ratio: 0.72  AORTA Ao Root diam: 3.80 cm MITRAL VALVE               TRICUSPID VALVE MV Area (PHT): 5.75 cm    TR Peak grad:   41.5 mmHg MV Decel Time: 132 msec    TR Vmax:        322.00 cm/s MV E velocity: 97.30 cm/s MV A velocity: 35.60 cm/s  SHUNTS MV E/A ratio:  2.73        Systemic VTI:  0.13 m                            Systemic Diam: 2.10 cm                            Pulmonic VTI:  0.116 m Kathlyn Sacramento MD Electronically signed by Kathlyn Sacramento MD Signature Date/Time: 12/18/2021/11:51:30 AM    Final         Scheduled Meds:  amoxicillin-clavulanate  1 tablet Oral Q12H   enoxaparin (LOVENOX) injection  0.5 mg/kg Subcutaneous Q24H   metoprolol tartrate  25 mg Oral BID   Continuous Infusions:  lactated ringers 100 mL/hr at 12/18/21 0812     LOS: 4 days    Time spent: 25 minutes    Barb Merino, MD [facility] Pager [phone]

## 2021-12-18 NOTE — Progress Notes (Signed)
*  PRELIMINARY RESULTS* Echocardiogram 2D Echocardiogram has been performed.  Steven Pham 12/18/2021, 8:47 AM

## 2021-12-19 ENCOUNTER — Telehealth: Payer: Self-pay | Admitting: Cardiovascular Disease

## 2021-12-19 DIAGNOSIS — I248 Other forms of acute ischemic heart disease: Secondary | ICD-10-CM

## 2021-12-19 DIAGNOSIS — R7401 Elevation of levels of liver transaminase levels: Secondary | ICD-10-CM

## 2021-12-19 DIAGNOSIS — I10 Essential (primary) hypertension: Secondary | ICD-10-CM

## 2021-12-19 DIAGNOSIS — I420 Dilated cardiomyopathy: Secondary | ICD-10-CM

## 2021-12-19 DIAGNOSIS — I471 Supraventricular tachycardia: Secondary | ICD-10-CM

## 2021-12-19 DIAGNOSIS — I4729 Other ventricular tachycardia: Secondary | ICD-10-CM

## 2021-12-19 DIAGNOSIS — I5041 Acute combined systolic (congestive) and diastolic (congestive) heart failure: Secondary | ICD-10-CM | POA: Diagnosis present

## 2021-12-19 MED ORDER — AMOXICILLIN-POT CLAVULANATE 875-125 MG PO TABS: 1.0000 | ORAL_TABLET | Freq: Two times a day (BID) | ORAL | 0 refills | Status: DC

## 2021-12-19 MED ORDER — SPIRONOLACTONE 25 MG PO TABS: 12.5000 mg | ORAL_TABLET | Freq: Every day | ORAL | 2 refills | Status: DC

## 2021-12-19 MED ORDER — CARVEDILOL 6.25 MG PO TABS: 6.2500 mg | ORAL_TABLET | Freq: Two times a day (BID) | ORAL | Status: DC

## 2021-12-19 MED ORDER — CARVEDILOL 6.25 MG PO TABS: 6.2500 mg | ORAL_TABLET | Freq: Two times a day (BID) | ORAL | 2 refills | Status: DC

## 2021-12-19 MED ORDER — LOSARTAN POTASSIUM 25 MG PO TABS: 12.5000 mg | ORAL_TABLET | Freq: Every day | ORAL | 2 refills | Status: DC

## 2021-12-19 NOTE — Discharge Summary (Signed)
Physician Discharge Summary  Steven Pham [id number] DOB: 1961/03/20 DOA: 12/14/2021  PCP: Lianne Bushy, MD  Admit date: 12/14/2021 Discharge date: 12/19/2021  Admitted From: Home Disposition: Home  Recommendations for Outpatient Follow-up:  Follow up with PCP in 1-2 weeks Please obtain BMP/CBC in one week Cardiology will schedule follow-up.  Home Health: N/A Equipment/Devices: N/A  Discharge Condition: Stable CODE STATUS: Full code Diet recommendation: Low-salt diet.  Fluid restriction 1500 mL/day.  Discharge summary: 60 year old gentleman with history of osteoarthritis, gout and hypertension on thiazide and Lasix presented to emergency room with acute onset of left-sided abdominal pain, dizziness and lightheadedness with some nausea and vomiting and chills. In the emergency room tachycardic, severely hypokalemic and hyponatremic, AKI.  COVID-19 and influenza negative.  Leukopenic.  CT scan abdomen with small bowel diverticulitis with some inflammatory changes.  Surgery consulted and admission to the medical floor. Abdominal symptoms improved.  Patient continued to have sinus tachycardia and PVCs.  An echocardiogram done shows severely depressed ejection fraction of 20 to 25% along with grade 3 diastolic dysfunction and cardiology consulted for management.   - Diverticulitis of the small intestines: Clinically improved.  Normal bowel function.  Treated with Rocephin and Flagyl.  Tolerating regular diet.  Changing to Augmentin today to complete 2 weeks of therapy.  Seen by surgery.  They will follow-up outpatient.   - Sepsis present on admission secondary to intra-abdominal infection as above.  Improved. - Acute kidney injury.  Severe dehydration.  Improved and normalized.  Potassium normalized.  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 Patient was found to have severe congestive heart failure.   Medically stable.  On room air.  No evidence of fluid overload.  No evidence of acute coronary syndrome.  Seen by cardiology.  Started on losartan, Aldactone and carvedilol.  Well compensated now.  Since patient is medically stable, they decided to do outpatient follow-up for ischemia evaluation. No additional diuretics as euvolemic on Aldactone.  Patient medically stabilized.  Discharging home today. Cardiology to schedule follow-up.  Surgery to schedule follow-up.   Discharge Diagnoses:  Principal Problem:   Diverticulitis of small bowel Active Problems:   Sepsis ([place])   AKI (acute kidney injury) ([place])   Hypokalemia   Hyponatremia   Elevated troponin   Transaminitis   Abnormal EKG   Essential hypertension    Discharge Instructions  Discharge Instructions     Call MD for:  difficulty breathing, headache or visual disturbances   Complete by: As directed    Diet - low sodium heart healthy   Complete by: As directed    Increase activity slowly   Complete by: As directed    Other Restrictions   Complete by: As directed    No heavy exercise or exertion for 2 weeks      Allergies as of 12/19/2021   No Known Allergies      Medication List     STOP taking these medications    hydrochlorothiazide 12.5 MG tablet Commonly known as: HYDRODIURIL   meloxicam 15 MG tablet Commonly known as: MOBIC       TAKE these medications    amoxicillin-clavulanate 875-125 MG tablet Commonly known as: AUGMENTIN Take 1 tablet by mouth every 12 (twelve) hours for 8 days.   carvedilol 6.25 MG tablet Commonly known as: COREG Take 1 tablet (6.25 mg total) by mouth 2 (two) times daily with a meal.   losartan 25 MG tablet Commonly known  as: COZAAR Take 0.5 tablets (12.5 mg total) by mouth daily. Start taking on: December 20, 2021   spironolactone 25 MG tablet Commonly known as: ALDACTONE Take 0.5 tablets (12.5 mg total) by mouth daily. Start taking on: December 20, 2021         Follow-up Information     Ronny Bacon, MD. Schedule an appointment as soon as possible for a visit in 1 month(s).   Specialty: General Surgery Why: hospital follow up; small bowel diverticulitis Contact information: [address] [place] [address] [phone]                No Known Allergies  Consultations: General surgery Cardiology   Procedures/Studies: DG Chest 1 View  Result Date: 12/14/2021 CLINICAL DATA:  Upper abdominal pain and vomiting. EXAM: CHEST  1 VIEW COMPARISON:  May 13, 2021 FINDINGS: Very mild, stable atelectasis is seen within the bilateral lung bases. There is no evidence of a pleural effusion or pneumothorax. The heart size and mediastinal contours are within normal limits. The visualized skeletal structures are unremarkable. IMPRESSION: Very mild bibasilar atelectasis. Electronically Signed   By: Virgina Norfolk M.D.   On: 12/14/2021 20:34   CT ABDOMEN PELVIS W CONTRAST  Result Date: 12/14/2021 CLINICAL DATA:  Upper abdominal pain for 2 days EXAM: CT ABDOMEN AND PELVIS WITH CONTRAST TECHNIQUE: Multidetector CT imaging of the abdomen and pelvis was performed using the standard protocol following bolus administration of intravenous contrast. CONTRAST:  79mL OMNIPAQUE IOHEXOL 300 MG/ML  SOLN COMPARISON:  None. FINDINGS: Lower chest: No acute abnormality. Hepatobiliary: No focal liver abnormality is seen. No gallstones, gallbladder wall thickening, or biliary dilatation. Pancreas: Unremarkable. No pancreatic ductal dilatation or surrounding inflammatory changes. Spleen: Normal in size without focal abnormality. Adrenals/Urinary Tract: Adrenal glands are within normal limits. Kidneys demonstrate a normal enhancement pattern bilaterally. Large simple cyst is noted arising from the lower pole of the left kidney measuring 5 cm in greatest dimension. No obstructive changes are seen. The ureters are within normal limits. The bladder is  partially distended. Stomach/Bowel: No obstructive or inflammatory changes of colon are seen. The appendix is within normal limits. Stomach is within normal limits. There are multiple fluid-filled outpouchings in the mid jejunum consistent with small bowel diverticular change. Some associated inflammatory changes are noted in the adjacent mesentery with findings suggestive of early venous air related to the inflammatory change. No definitive portal venous air is noted. Vascular/Lymphatic: Atherosclerotic calcifications are seen. Some inflammatory changes are noted in the root of the small bowel mesentery in the left mid abdomen as described above. Some linear areas of air are noted in the inflamed mesentery and the possibility of early SMV air could not be totally excluded. Reproductive: Prostate is unremarkable. Other: Small fat containing umbilical hernia is noted. No abdominopelvic ascites. Musculoskeletal: No acute or significant osseous findings. IMPRESSION: Changes consistent with small bowel diverticulitis with inflammatory changes in the mesentery in the left mid abdomen as well as some very early venous air adjacent to the small bowel loops. Left renal cyst. Critical Value/emergent results were called by telephone at the time of interpretation on 12/14/2021 at 10:34 pm to Dr. Conni Slipper , who verbally acknowledged these results. Electronically Signed   By: Inez Catalina M.D.   On: 12/14/2021 22:38   ECHOCARDIOGRAM COMPLETE  Result Date: 12/18/2021    ECHOCARDIOGRAM REPORT   Patient Name:   Steven Pham Date of Exam: 12/18/2021 Medical Rec #:  [id number]    Height:  62.0 in Accession #:    [id number]   Weight:       256.0 lb Date of Birth:  [birth year]    BSA:          2.123 m Patient Age:    62 years     BP:           114/86 mmHg Patient Gender: M            HR:           82 bpm. Exam Location:  [facility] Procedure: 2D Echo, Cardiac Doppler and Color Doppler Indications:     Abnormal ECG R 94.31   History:         Patient has no prior history of Echocardiogram examinations.                  Risk Factors:Hypertension.  Sonographer:     Sherrie Sport Referring Phys:  [id number] Barb Merino Diagnosing Phys: Kathlyn Sacramento MD  Sonographer Comments: Suboptimal apical window. IMPRESSIONS  1. Left ventricular ejection fraction, by estimation, is 20 to 25%. The left ventricle has severely decreased function. The left ventricle demonstrates global hypokinesis. The left ventricular internal cavity size was mildly dilated. There is mild left ventricular hypertrophy. Left ventricular diastolic parameters are consistent with Grade III diastolic dysfunction (restrictive).  2. Right ventricular systolic function is normal. The right ventricular size is normal. There is mildly elevated pulmonary artery systolic pressure.  3. Left atrial size was severely dilated.  4. Right atrial size was mildly dilated.  5. The mitral valve is normal in structure. Mild to moderate mitral valve regurgitation. No evidence of mitral stenosis.  6. The aortic valve is normal in structure. Aortic valve regurgitation is not visualized. No aortic stenosis is present.  7. The inferior vena cava is normal in size with greater than 50% respiratory variability, suggesting right atrial pressure of 3 mmHg. FINDINGS  Left Ventricle: Left ventricular ejection fraction, by estimation, is 20 to 25%. The left ventricle has severely decreased function. The left ventricle demonstrates global hypokinesis. The left ventricular internal cavity size was mildly dilated. There is mild left ventricular hypertrophy. Left ventricular diastolic parameters are consistent with Grade III diastolic dysfunction (restrictive). Right Ventricle: The right ventricular size is normal. No increase in right ventricular wall thickness. Right ventricular systolic function is normal. There is mildly elevated pulmonary artery systolic pressure. The tricuspid regurgitant velocity is 3.22   m/s, and with an assumed right atrial pressure of 3 mmHg, the estimated right ventricular systolic pressure is [id number]. Left Atrium: Left atrial size was severely dilated. Right Atrium: Right atrial size was mildly dilated. Pericardium: There is no evidence of pericardial effusion. Mitral Valve: The mitral valve is normal in structure. Mild to moderate mitral valve regurgitation. No evidence of mitral valve stenosis. Tricuspid Valve: The tricuspid valve is normal in structure. Tricuspid valve regurgitation is mild . No evidence of tricuspid stenosis. Aortic Valve: The aortic valve is normal in structure. Aortic valve regurgitation is not visualized. No aortic stenosis is present. Aortic valve mean gradient measures 3.0 mmHg. Aortic valve peak gradient measures 4.0 mmHg. Aortic valve area, by VTI measures 2.50 cm. Pulmonic Valve: The pulmonic valve was normal in structure. Pulmonic valve regurgitation is not visualized. No evidence of pulmonic stenosis. Aorta: The aortic root is normal in size and structure. Venous: The inferior vena cava is normal in size with greater than 50% respiratory variability, suggesting right atrial pressure of 3 mmHg. IAS/Shunts: No  atrial level shunt detected by color flow Doppler.  LEFT VENTRICLE PLAX 2D LVIDd:         5.00 cm      Diastology LVIDs:         4.60 cm      LV e' medial:    4.35 cm/s LV PW:         1.50 cm      LV E/e' medial:  22.4 LV IVS:        1.10 cm      LV e' lateral:   6.53 cm/s LVOT diam:     2.10 cm      LV E/e' lateral: 14.9 LV SV:         44 LV SV Index:   21 LVOT Area:     3.46 cm  LV Volumes (MOD) LV vol d, MOD A2C: 190.0 ml LV vol d, MOD A4C: 213.0 ml LV vol s, MOD A2C: 144.0 ml LV vol s, MOD A4C: 165.0 ml LV SV MOD A2C:     46.0 ml LV SV MOD A4C:     213.0 ml LV SV MOD BP:      46.4 ml RIGHT VENTRICLE RV Basal diam:  4.30 cm TAPSE (M-mode): 4.2 cm LEFT ATRIUM              Index        RIGHT ATRIUM           Index LA diam:        5.10 cm  2.40 cm/m   RA  Area:     23.90 cm LA Vol (A2C):   91.9 ml  43.28 ml/m  RA Volume:   67.40 ml  31.75 ml/m LA Vol (A4C):   146.0 ml 68.77 ml/m LA Biplane Vol: 116.0 ml 54.64 ml/m  AORTIC VALVE                    PULMONIC VALVE AV Area (Vmax):    2.17 cm     PV Vmax:        0.68 m/s AV Area (Vmean):   2.27 cm     PV Vmean:       42.100 cm/s AV Area (VTI):     2.50 cm     PV VTI:         0.088 m AV Vmax:           100.00 cm/s  PV Peak grad:   1.8 mmHg AV Vmean:          76.200 cm/s  PV Mean grad:   1.0 mmHg AV VTI:            0.176 m      RVOT Peak grad: 4 mmHg AV Peak Grad:      4.0 mmHg AV Mean Grad:      3.0 mmHg LVOT Vmax:         62.70 cm/s LVOT Vmean:        50.000 cm/s LVOT VTI:          0.127 m LVOT/AV VTI ratio: 0.72  AORTA Ao Root diam: 3.80 cm MITRAL VALVE               TRICUSPID VALVE MV Area (PHT): 5.75 cm    TR Peak grad:   41.5 mmHg MV Decel Time: 132 msec    TR Vmax:        322.00 cm/s MV E velocity: 97.30 cm/s MV A  velocity: 35.60 cm/s  SHUNTS MV E/A ratio:  2.73        Systemic VTI:  0.13 m                            Systemic Diam: 2.10 cm                            Pulmonic VTI:  0.116 m Lorine Bears MD Electronically signed by Lorine Bears MD Signature Date/Time: 12/18/2021/11:51:30 AM    Final    (Echo, Carotid, EGD, Colonoscopy, ERCP)    Subjective: Patient was seen and examined.  Denies any complaints.  Walking around in the hallway.  Occasional episodes of tachycardia on the monitor but mostly remained sinus rhythms.  Patient is without any chest pain or palpitations.   Discharge Exam: Vitals:   12/19/21 0334 12/19/21 0823  BP: 121/90 (!) 135/94  Pulse: 78 79  Resp: 16 20  Temp: 98 F (36.7 C) 98 F (36.7 C)  SpO2: 95% 96%   Vitals:   12/18/21 1535 12/18/21 2105 12/19/21 0334 12/19/21 0823  BP: 129/89 117/88 121/90 (!) 135/94  Pulse: (!) 40 81 78 79  Resp: 18 16 16 20   Temp: 97.8 F (36.6 C) 98 F (36.7 C) 98 F (36.7 C) 98 F (36.7 C)  TempSrc: Oral Oral    SpO2: 99%  99% 95% 96%  Weight:      Height:        General: Pt is alert, awake, not in acute distress Walking around the hallway. Cardiovascular: RRR, S1/S2 +, no rubs, no gallops Respiratory: CTA bilaterally, no wheezing, no rhonchi Abdominal: Soft, NT, ND, bowel sounds +, obese and pendulous. Extremities: no edema, no cyanosis    The results of significant diagnostics from this hospitalization (including imaging, microbiology, ancillary and laboratory) are listed below for reference.     Microbiology: Recent Results (from the past 240 hour(s))  Resp Panel by RT-PCR (Flu A&B, Covid) Nasopharyngeal Swab     Status: None   Collection Time: 12/14/21  8:16 PM   Specimen: Nasopharyngeal Swab; Nasopharyngeal(NP) swabs in vial transport medium  Result Value Ref Range Status   SARS Coronavirus 2 by RT PCR NEGATIVE NEGATIVE Final    Comment: (NOTE) SARS-CoV-2 target nucleic acids are NOT DETECTED.  The SARS-CoV-2 RNA is generally detectable in upper respiratory specimens during the acute phase of infection. The lowest concentration of SARS-CoV-2 viral copies this assay can detect is 138 copies/mL. A negative result does not preclude SARS-Cov-2 infection and should not be used as the sole basis for treatment or other patient management decisions. A negative result may occur with  improper specimen collection/handling, submission of specimen other than nasopharyngeal swab, presence of viral mutation(s) within the areas targeted by this assay, and inadequate number of viral copies(<138 copies/mL). A negative result must be combined with clinical observations, patient history, and epidemiological information. The expected result is Negative.  Fact Sheet for Patients:  12/16/21  Fact Sheet for Healthcare Providers:  BloggerCourse.com  This test is no t yet approved or cleared by the [employer] FDA and  has been authorized for  detection and/or diagnosis of SARS-CoV-2 by FDA under an Emergency Use Authorization (EUA). This EUA will remain  in effect (meaning this test can be used) for the duration of the COVID-19 declaration under Section 564(b)(1) of the Act, 21 U.S.C.section 360bbb-3(b)(1), unless the authorization  is terminated  or revoked sooner.       Influenza A by PCR NEGATIVE NEGATIVE Final   Influenza B by PCR NEGATIVE NEGATIVE Final    Comment: (NOTE) The Xpert Xpress SARS-CoV-2/FLU/RSV plus assay is intended as an aid in the diagnosis of influenza from Nasopharyngeal swab specimens and should not be used as a sole basis for treatment. Nasal washings and aspirates are unacceptable for Xpert Xpress SARS-CoV-2/FLU/RSV testing.  Fact Sheet for Patients: EntrepreneurPulse.com.au  Fact Sheet for Healthcare Providers: IncredibleEmployment.be  This test is not yet approved or cleared by the [place] FDA and has been authorized for detection and/or diagnosis of SARS-CoV-2 by FDA under an Emergency Use Authorization (EUA). This EUA will remain in effect (meaning this test can be used) for the duration of the COVID-19 declaration under Section 564(b)(1) of the Act, 21 U.S.C. section 360bbb-3(b)(1), unless the authorization is terminated or revoked.  Performed at [facility], [address] [place], [place] [address]      Labs: BNP (last 3 results) No results for input(s): BNP in the last 8760 hours. Basic Metabolic Panel: Recent Labs  Lab 12/15/21 1718 12/16/21 0222 12/16/21 1734 12/17/21 0246 12/18/21 0329  NA 140 141   141 139 140 137  K 3.1* 3.9   3.8 4.0 3.5 3.4*  CL 108 112*   112* 111 110 108  CO2 21* 19*   20* 22 24 23   GLUCOSE 95 86   86 128* 116* 123*  BUN 22* 21*   19 18 14 10   CREATININE 1.60* 1.56*   1.49* 1.26* 1.17 1.09  CALCIUM 7.3* 7.4*   7.4* 7.7* 7.9* 8.1*  MG 1.6*  --   --  2.1 2.0  PHOS  --   --  1.8*  --   --     Liver Function Tests: Recent Labs  Lab 12/14/21 2010 12/15/21 1718 12/16/21 0222 12/16/21 1734 12/17/21 0246  AST 74* 69* 66* 66* 54*  ALT 32 36 35 37 32  ALKPHOS 119 106 96 122 121  BILITOT 2.3* 1.5* 1.3* 1.1 1.0  PROT 8.4* 7.1 6.6 7.4 6.7  ALBUMIN 3.3* 2.8* 2.8* 2.7* 2.5*   Recent Labs  Lab 12/14/21 2010  LIPASE 56*   No results for input(s): AMMONIA in the last 168 hours. CBC: Recent Labs  Lab 12/14/21 2010 12/15/21 0622 12/16/21 0930 12/17/21 0246  WBC 2.6* 12.5* 5.8 6.0  NEUTROABS 1.9  --   --   --   HGB 13.1 12.5* 11.1* 10.8*  HCT 38.4* 35.9* 32.7* 31.6*  MCV 75.3* 74.8* 76.0* 75.1*  PLT 151 171 146* 145*   Cardiac Enzymes: No results for input(s): CKTOTAL, CKMB, CKMBINDEX, TROPONINI in the last 168 hours. BNP: Invalid input(s): POCBNP CBG: Recent Labs  Lab 12/14/21 2008 12/16/21 0011  GLUCAP 203* 105*   D-Dimer No results for input(s): DDIMER in the last 72 hours. Hgb A1c No results for input(s): HGBA1C in the last 72 hours. Lipid Profile No results for input(s): CHOL, HDL, LDLCALC, TRIG, CHOLHDL, LDLDIRECT in the last 72 hours. Thyroid function studies No results for input(s): TSH, T4TOTAL, T3FREE, THYROIDAB in the last 72 hours.  Invalid input(s): FREET3 Anemia work up No results for input(s): VITAMINB12, FOLATE, FERRITIN, TIBC, IRON, RETICCTPCT in the last 72 hours. Urinalysis    Component Value Date/Time   COLORURINE YELLOW (A) 12/14/2021 0749   APPEARANCEUR HAZY (A) 12/14/2021 0749   LABSPEC 1.029 12/14/2021 0749   PHURINE 5.0 12/14/2021 0749  GLUCOSEU NEGATIVE 12/14/2021 0749   HGBUR MODERATE (A) 12/14/2021 0749   BILIRUBINUR NEGATIVE 12/14/2021 0749   KETONESUR NEGATIVE 12/14/2021 0749   PROTEINUR 30 (A) 12/14/2021 0749   NITRITE NEGATIVE 12/14/2021 0749   LEUKOCYTESUR NEGATIVE 12/14/2021 0749   Sepsis Labs Invalid input(s): PROCALCITONIN,  WBC,  LACTICIDVEN Microbiology Recent Results (from the past 240 hour(s))  Resp  Panel by RT-PCR (Flu A&B, Covid) Nasopharyngeal Swab     Status: None   Collection Time: 12/14/21  8:16 PM   Specimen: Nasopharyngeal Swab; Nasopharyngeal(NP) swabs in vial transport medium  Result Value Ref Range Status   SARS Coronavirus 2 by RT PCR NEGATIVE NEGATIVE Final    Comment: (NOTE) SARS-CoV-2 target nucleic acids are NOT DETECTED.  The SARS-CoV-2 RNA is generally detectable in upper respiratory specimens during the acute phase of infection. The lowest concentration of SARS-CoV-2 viral copies this assay can detect is 138 copies/mL. A negative result does not preclude SARS-Cov-2 infection and should not be used as the sole basis for treatment or other patient management decisions. A negative result may occur with  improper specimen collection/handling, submission of specimen other than nasopharyngeal swab, presence of viral mutation(s) within the areas targeted by this assay, and inadequate number of viral copies(<138 copies/mL). A negative result must be combined with clinical observations, patient history, and epidemiological information. The expected result is Negative.  Fact Sheet for Patients:  EntrepreneurPulse.com.au  Fact Sheet for Healthcare Providers:  IncredibleEmployment.be  This test is no t yet approved or cleared by the [place] FDA and  has been authorized for detection and/or diagnosis of SARS-CoV-2 by FDA under an Emergency Use Authorization (EUA). This EUA will remain  in effect (meaning this test can be used) for the duration of the COVID-19 declaration under Section 564(b)(1) of the Act, 21 U.S.C.section 360bbb-3(b)(1), unless the authorization is terminated  or revoked sooner.       Influenza A by PCR NEGATIVE NEGATIVE Final   Influenza B by PCR NEGATIVE NEGATIVE Final    Comment: (NOTE) The Xpert Xpress SARS-CoV-2/FLU/RSV plus assay is intended as an aid in the diagnosis of influenza from Nasopharyngeal  swab specimens and should not be used as a sole basis for treatment. Nasal washings and aspirates are unacceptable for Xpert Xpress SARS-CoV-2/FLU/RSV testing.  Fact Sheet for Patients: EntrepreneurPulse.com.au  Fact Sheet for Healthcare Providers: IncredibleEmployment.be  This test is not yet approved or cleared by the [place] FDA and has been authorized for detection and/or diagnosis of SARS-CoV-2 by FDA under an Emergency Use Authorization (EUA). This EUA will remain in effect (meaning this test can be used) for the duration of the COVID-19 declaration under Section 564(b)(1) of the Act, 21 U.S.C. section 360bbb-3(b)(1), unless the authorization is terminated or revoked.  Performed at [facility], [address], [place], [place] [address]      Time coordinating discharge:  40 minutes  SIGNED:   Barb Merino, MD  [facility] 12/19/2021, 11:21 AM

## 2021-12-19 NOTE — Telephone Encounter (Signed)
Attempted to schedule no ans no vm  

## 2021-12-19 NOTE — Progress Notes (Signed)
Progress Note  Patient Name: Steven Pham Date of Encounter: 12/19/2021  Primary Cardiologist: Kathlyn Sacramento, MD  Subjective   Feels well - no chest pain, dyspnea, or abdominal pain. Eager to go home.  Inpatient Medications    Scheduled Meds:  amoxicillin-clavulanate  1 tablet Oral Q12H   carvedilol  6.25 mg Oral BID WC   enoxaparin (LOVENOX) injection  0.5 mg/kg Subcutaneous Q24H   losartan  12.5 mg Oral Daily   spironolactone  12.5 mg Oral Daily   Continuous Infusions:  PRN Meds: acetaminophen **OR** acetaminophen, magnesium hydroxide, morphine injection, ondansetron **OR** ondansetron (ZOFRAN) IV, traZODone   Vital Signs    Vitals:   12/18/21 1535 12/18/21 2105 12/19/21 0334 12/19/21 0823  BP: 129/89 117/88 121/90 (!) 135/94  Pulse: (!) 40 81 78 79  Resp: 18 16 16 20   Temp: 97.8 F (36.6 C) 98 F (36.7 C) 98 F (36.7 C) 98 F (36.7 C)  TempSrc: Oral Oral    SpO2: 99% 99% 95% 96%  Weight:      Height:        Intake/Output Summary (Last 24 hours) at 12/19/2021 1056 Last data filed at 12/19/2021 1054 Gross per 24 hour  Intake 540 ml  Output 500 ml  Net 40 ml   Filed Weights   12/14/21 2021  Weight: 116.1 kg    Physical Exam   GEN: Well nourished, well developed, in no acute distress.  HEENT: Grossly normal.  Neck: Supple, no JVD, carotid bruits, or masses. Cardiac: RRR, no murmurs, rubs, or gallops. No clubbing, cyanosis, edema.  Radials 2+, DP/PT 2+ and equal bilaterally.  Respiratory:  Respirations regular and unlabored, clear to auscultation bilaterally. GI: Soft, nontender, nondistended, BS + x 4. MS: no deformity or atrophy. Skin: warm and dry, no rash. Neuro:  Strength and sensation are intact. Psych: AAOx3.  Normal affect.  Labs    Chemistry Recent Labs  Lab 12/16/21 0222 12/16/21 1734 12/17/21 0246 12/18/21 0329  NA 141   141 139 140 137  K 3.9   3.8 4.0 3.5 3.4*  CL 112*   112* 111 110 108  CO2 19*   20* 22 24 23   GLUCOSE  86   86 128* 116* 123*  BUN 21*   19 18 14 10   CREATININE 1.56*   1.49* 1.26* 1.17 1.09  CALCIUM 7.4*   7.4* 7.7* 7.9* 8.1*  PROT 6.6 7.4 6.7  --   ALBUMIN 2.8* 2.7* 2.5*  --   AST 66* 66* 54*  --   ALT 35 37 32  --   ALKPHOS 96 122 121  --   BILITOT 1.3* 1.1 1.0  --   GFRNONAA 51*   53* >60 >60 >60  ANIONGAP 10   9 6 6 6      Hematology Recent Labs  Lab 12/15/21 0622 12/16/21 0930 12/17/21 0246  WBC 12.5* 5.8 6.0  RBC 4.80 4.30 4.21*  HGB 12.5* 11.1* 10.8*  HCT 35.9* 32.7* 31.6*  MCV 74.8* 76.0* 75.1*  MCH 26.0 25.8* 25.7*  MCHC 34.8 33.9 34.2  RDW 14.7 15.6* 15.9*  PLT 171 146* 145*    Cardiac Enzymes  Recent Labs  Lab 12/14/21 2010 12/14/21 2233 12/15/21 0622 12/16/21 0658  TROPONINIHS 60* 81* 113* 72*     Radiology    ------------  Telemetry    Runs of PSVT throughout the evening last night, but quiescent since ~ 2100 - Personally Reviewed  Cardiac Studies   2D Echocardiogram 12.19.2022  1. Left ventricular ejection fraction, by estimation, is 20 to 25%. The  left ventricle has severely decreased function. The left ventricle  demonstrates global hypokinesis. The left ventricular internal cavity size  was mildly dilated. There is mild left  ventricular hypertrophy. Left ventricular diastolic parameters are  consistent with Grade III diastolic dysfunction (restrictive).   2. Right ventricular systolic function is normal. The right ventricular  size is normal. There is mildly elevated pulmonary artery systolic  pressure.   3. Left atrial size was severely dilated.   4. Right atrial size was mildly dilated.   5. The mitral valve is normal in structure. Mild to moderate mitral valve  regurgitation. No evidence of mitral stenosis.   6. The aortic valve is normal in structure. Aortic valve regurgitation is  not visualized. No aortic stenosis is present.   7. The inferior vena cava is normal in size with greater than 50%  respiratory variability,  suggesting right atrial pressure of 3 mmHg.  _____________   Patient Profile      60 y.o. male with a history of HTN, OA, and gout, who is being seen today for the evaluation of NSVT, PSVT, and newly found dilated cardiomyopathy at the request of Dr. Jerral Ralph.  Assessment & Plan    1.  Dilated cardiomyopathy:  Pt w/o prior cardiac hx, admitted 12/17 w/ diverticulitis and incidentally noted to have sinus tachycardia followed by intermittent runs of SVT as well as NSVT.  All arrhythmias have been asymptomatic.  HsTrops mildly elevated w/ flat trend @ 60  81  113  72.  He denies any prior h/o chest pain, dyspnea, palpitations, or presyncope/syncope.  Echo performed 12/19 w/ EF of 20-25%, glob HK, GrIII diast dysfxn, nl RV fxn, sev dil LA, and mild to mod MR. SVT quiescent overnight.  He remains euvolemic and asymptomatic from a heart failure standpoint this morning.  He will be discharged home by the medicine team and we will arrange for early outpatient follow-up, at which time we can plan on right and left heart diagnostic catheterization.  Continue beta-blocker, ARB, and spironolactone therapy.  With history of SVT, will increase beta-blocker to 6.25 mg twice daily.  2.  Diverticulitis:  Feels much better.  No abd pain.  Seen by [facility] w/ rec for conservative mgmt.  Abx per primary team.   3.  Essential HTN:  Stable.  Increasing carvedilol in setting of PSVT.  Cont low dose ARB/spiro.  4.  PSVT/NSVT: Quiescent since 9 PM last night.  Titrating beta-blocker further this morning.  5.  Demand ischemia: In the setting of diverticulitis and tachycardia.  Peak troponin 213.  He denies any history of chest pain or dyspnea.  We will follow-up as outpatient and plan on diagnostic catheterization at that time.  Continue aspirin, beta-blocker, and statin therapy.  6.  Hypokalemia/Hypomagnesemia: Patient supplemented throughout hospitalization.  No labs this morning.  Signed, Nicolasa Ducking, NP   12/19/2021, 10:56 AM    For questions or updates, please contact   Please consult [email or web address] for contact info under Cardiology/STEMI.

## 2021-12-19 NOTE — Telephone Encounter (Addendum)
-----   Message from Creig Hines, NP sent at 12/19/2021 10:52 AM EST -----    Would you pls arrange for f/u for this pt w/ me or arida in ~ 2 wks?  Thanks,  Thayer Ohm

## 2021-12-25 ENCOUNTER — Emergency Department: Payer: BC Managed Care – PPO

## 2021-12-25 ENCOUNTER — Inpatient Hospital Stay
Admission: EM | Admit: 2021-12-25 | Discharge: 2021-12-27 | DRG: 441 | Disposition: A | Discharge status: Home / Self Care | Payer: BC Managed Care – PPO | Attending: Internal Medicine | Admitting: Internal Medicine

## 2021-12-25 ENCOUNTER — Encounter: Payer: Self-pay | Admitting: Emergency Medicine

## 2021-12-25 ENCOUNTER — Other Ambulatory Visit: Payer: Self-pay

## 2021-12-25 DIAGNOSIS — I82890 Acute embolism and thrombosis of other specified veins: Secondary | ICD-10-CM | POA: Diagnosis not present

## 2021-12-25 DIAGNOSIS — I5042 Chronic combined systolic (congestive) and diastolic (congestive) heart failure: Secondary | ICD-10-CM | POA: Diagnosis not present

## 2021-12-25 DIAGNOSIS — I700 Atherosclerosis of aorta: Secondary | ICD-10-CM | POA: Diagnosis present

## 2021-12-25 DIAGNOSIS — D72829 Elevated white blood cell count, unspecified: Secondary | ICD-10-CM

## 2021-12-25 DIAGNOSIS — N170 Acute kidney failure with tubular necrosis: Secondary | ICD-10-CM | POA: Diagnosis present

## 2021-12-25 DIAGNOSIS — Z79899 Other long term (current) drug therapy: Secondary | ICD-10-CM | POA: Diagnosis not present

## 2021-12-25 DIAGNOSIS — I959 Hypotension, unspecified: Secondary | ICD-10-CM | POA: Diagnosis not present

## 2021-12-25 DIAGNOSIS — R52 Pain, unspecified: Secondary | ICD-10-CM | POA: Diagnosis not present

## 2021-12-25 DIAGNOSIS — E860 Dehydration: Secondary | ICD-10-CM | POA: Diagnosis not present

## 2021-12-25 DIAGNOSIS — K429 Umbilical hernia without obstruction or gangrene: Secondary | ICD-10-CM | POA: Diagnosis not present

## 2021-12-25 DIAGNOSIS — M1A9XX0 Chronic gout, unspecified, without tophus (tophi): Secondary | ICD-10-CM | POA: Diagnosis present

## 2021-12-25 DIAGNOSIS — N281 Cyst of kidney, acquired: Secondary | ICD-10-CM | POA: Diagnosis not present

## 2021-12-25 DIAGNOSIS — I81 Portal vein thrombosis: Principal | ICD-10-CM

## 2021-12-25 DIAGNOSIS — Z8249 Family history of ischemic heart disease and other diseases of the circulatory system: Secondary | ICD-10-CM | POA: Diagnosis not present

## 2021-12-25 DIAGNOSIS — R9431 Abnormal electrocardiogram [ECG] [EKG]: Secondary | ICD-10-CM | POA: Diagnosis not present

## 2021-12-25 DIAGNOSIS — I110 Hypertensive heart disease with heart failure: Secondary | ICD-10-CM | POA: Diagnosis present

## 2021-12-25 DIAGNOSIS — I5022 Chronic systolic (congestive) heart failure: Secondary | ICD-10-CM

## 2021-12-25 DIAGNOSIS — I10 Essential (primary) hypertension: Secondary | ICD-10-CM | POA: Diagnosis present

## 2021-12-25 DIAGNOSIS — N179 Acute kidney failure, unspecified: Secondary | ICD-10-CM | POA: Diagnosis present

## 2021-12-25 DIAGNOSIS — M109 Gout, unspecified: Secondary | ICD-10-CM

## 2021-12-25 DIAGNOSIS — M1990 Unspecified osteoarthritis, unspecified site: Secondary | ICD-10-CM | POA: Diagnosis present

## 2021-12-25 DIAGNOSIS — Z20822 Contact with and (suspected) exposure to covid-19: Secondary | ICD-10-CM | POA: Diagnosis present

## 2021-12-25 DIAGNOSIS — I429 Cardiomyopathy, unspecified: Secondary | ICD-10-CM | POA: Diagnosis present

## 2021-12-25 DIAGNOSIS — K5730 Diverticulosis of large intestine without perforation or abscess without bleeding: Secondary | ICD-10-CM | POA: Diagnosis not present

## 2021-12-25 DIAGNOSIS — I8290 Acute embolism and thrombosis of unspecified vein: Secondary | ICD-10-CM

## 2021-12-25 DIAGNOSIS — K529 Noninfective gastroenteritis and colitis, unspecified: Secondary | ICD-10-CM | POA: Diagnosis not present

## 2021-12-25 LAB — CBC
HCT: 29.2 % — ABNORMAL LOW (ref 39.0–52.0)
Hemoglobin: 9.9 g/dL — ABNORMAL LOW (ref 13.0–17.0)
MCH: 25.3 pg — ABNORMAL LOW (ref 26.0–34.0)
MCHC: 33.9 g/dL (ref 30.0–36.0)
MCV: 74.7 fL — ABNORMAL LOW (ref 80.0–100.0)
Platelets: 416 10*3/uL — ABNORMAL HIGH (ref 150–400)
RBC: 3.91 MIL/uL — ABNORMAL LOW (ref 4.22–5.81)
RDW: 15.8 % — ABNORMAL HIGH (ref 11.5–15.5)
WBC: 15.3 10*3/uL — ABNORMAL HIGH (ref 4.0–10.5)
nRBC: 0 % (ref 0.0–0.2)

## 2021-12-25 LAB — TROPONIN I (HIGH SENSITIVITY): Troponin I (High Sensitivity): 15 ng/L (ref <18)

## 2021-12-25 LAB — BASIC METABOLIC PANEL
Anion gap: 12 (ref 5–15)
BUN: 18 mg/dL (ref 6–20)
CO2: 23 mmol/L (ref 22–32)
Calcium: 8.3 mg/dL — ABNORMAL LOW (ref 8.9–10.3)
Chloride: 97 mmol/L — ABNORMAL LOW (ref 98–111)
Creatinine, Ser: 2.4 mg/dL — ABNORMAL HIGH (ref 0.61–1.24)
GFR, Estimated: 30 mL/min — ABNORMAL LOW (ref >60)
Glucose, Bld: 123 mg/dL — ABNORMAL HIGH (ref 70–99)
Potassium: 4.1 mmol/L (ref 3.5–5.1)
Sodium: 132 mmol/L — ABNORMAL LOW (ref 135–145)

## 2021-12-25 MED ORDER — LACTATED RINGERS IV BOLUS
1000.0000 mL | Freq: Once | INTRAVENOUS | Status: AC
  Administered 2021-12-26: 1000 mL via INTRAVENOUS

## 2021-12-25 MED ORDER — SODIUM CHLORIDE 0.9 % IV BOLUS
500.0000 mL | Freq: Once | INTRAVENOUS | Status: AC
  Administered 2021-12-25: 23:00:00 500 mL via INTRAVENOUS

## 2021-12-25 NOTE — ED Provider Notes (Signed)
Emergency Medicine Provider Triage Evaluation Note  Steven Pham, a 60 y.o. male  was evaluated in triage.  Pt complains of pain related to his arthritis and gout.  He called EMS out to the home, due to increased pain to the bilateral ankles and the left wrist.  He denies any injuries or fall for yesterday denies any fever, chills, sweats.  Patient reports he takes allopurinol for his gout flares and has been taking that medicine as prescribed.  He has had some loose stools but denies any frank fevers, abdominal pain, chest pain, or shortness of breath.  Review of Systems  Positive: Joint pain Negative: FCS  Physical Exam  There were no vitals taken for this visit. Gen:   Awake, no distress  NAD Resp:  Normal effort CTA MSK:   Moves extremities without difficulty  Other:  CVS: RRR   Medical Decision Making  Medically screening exam initiated at 9:23 PM.  Appropriate orders placed.  Steven Pham was informed that the remainder of the evaluation will be completed by another provider, this initial triage assessment does not replace that evaluation, and the importance of remaining in the ED until their evaluation is complete.  Patient with history of arthritis, hypertension, and gout presents to the ED for evaluation of pain to the bilateral ankles and the left wrist.  He denies any preceding injury, trauma, fall.   Lissa Hoard, PA-C 12/25/21 2125    Sharyn Creamer, MD 12/25/21 2141

## 2021-12-25 NOTE — ED Triage Notes (Signed)
Pt arrived via [facility] from home with reports of pain to L and R ankle and L wrist, pt states he has gout and arthritis, states he is unable to move without pain. Pt states he is taking gout medication that is making have frequent stools.  Pt has L wrist splint on on arrival.   Pt denies any fevers.

## 2021-12-25 NOTE — ED Provider Notes (Signed)
[facility] Emergency Department Provider Note    ____________________________________________   I have reviewed the triage vital signs and the nursing notes.   HISTORY  Chief Complaint Gout and Arthritis   History limited by: Not Limited   HPI Steven Pham is a 60 y.o. male who presents to the emergency department today with complaints of left arm and bilateral ankle pain.  Patient states he has a history of gout.  His gout will flareup from time to time.  This flareup has been present for the past couple of days.  He did have a recent admission to the hospital for diverticulitis.  He states that that feels better at this time.  He says that his flareup started a couple days after leaving the hospital.  He has tried his home medication without any improvement to his gout.  Denies any recent fevers.  No chest pain.  Records reviewed. Per medical record review patient has a history of gout. Recent admission for diverticulitis treated with antibiotics.   Past Medical History:  Diagnosis Date   Arthritis    Cardiomyopathy (HCC)    a. 11/2021 Echo: EF 20-25%, glob HK. GrIII DD. Nl RV size/fxn. Mildly elev PASP. Sev dil LA, mildly dil RA. Mild to mod MR.   Gout    Hypertension    NSVT (nonsustained ventricular tachycardia) 11/2021   PSVT (paroxysmal supraventricular tachycardia) (HCC) 11/2021    Patient Active Problem List   Diagnosis Date Noted   Acute combined systolic and diastolic congestive heart failure (HCC) 12/19/2021   Sepsis (HCC) 12/16/2021   AKI (acute kidney injury) (HCC) 12/16/2021   Hypokalemia 12/16/2021   Hyponatremia 12/16/2021   Elevated troponin 12/16/2021   Transaminitis 12/16/2021   Abnormal EKG 12/16/2021   Essential hypertension 12/16/2021   Diverticulitis of small bowel 12/14/2021    History reviewed. No pertinent surgical history.  Prior to Admission medications   Medication Sig Start Date End Date Taking? Authorizing  Provider  amoxicillin-clavulanate (AUGMENTIN) 875-125 MG tablet Take 1 tablet by mouth every 12 (twelve) hours for 8 days. 12/19/21 12/27/21  Dorcas Carrow, MD  carvedilol (COREG) 6.25 MG tablet Take 1 tablet (6.25 mg total) by mouth 2 (two) times daily with a meal. 12/19/21 03/19/22  Dorcas Carrow, MD  losartan (COZAAR) 25 MG tablet Take 0.5 tablets (12.5 mg total) by mouth daily. 12/20/21 03/20/22  Dorcas Carrow, MD  spironolactone (ALDACTONE) 25 MG tablet Take 0.5 tablets (12.5 mg total) by mouth daily. 12/20/21 03/20/22  Dorcas Carrow, MD    Allergies Patient has no known allergies.  History reviewed. No pertinent family history.  Social History Social History   Tobacco Use   Smoking status: Never   Smokeless tobacco: Never  Substance Use Topics   Alcohol use: No   Drug use: No    Review of Systems Constitutional: No fever/chills Eyes: No visual changes. ENT: No sore throat. Cardiovascular: Denies chest pain. Respiratory: Denies shortness of breath. Gastrointestinal: No abdominal pain.  No nausea, no vomiting.  No diarrhea.   Genitourinary: Negative for dysuria. Musculoskeletal: Positive for bilateral ankle and left wrist pain. Skin: Negative for rash. Neurological: Negative for headaches, focal weakness or numbness.  ____________________________________________   PHYSICAL EXAM:  VITAL SIGNS: ED Triage Vitals  Enc Vitals Group     BP 12/25/21 2125 (!) 76/48     Pulse Rate 12/25/21 2125 99     Resp 12/25/21 2125 18     Temp 12/25/21 2125 99.8 F (37.7 C)  Temp Source 12/25/21 2125 Oral     SpO2 12/25/21 2125 95 %     Weight 12/25/21 2125 245 lb (111.1 kg)     Height 12/25/21 2125 5\' 2"  (1.575 m)     Head Circumference --      Peak Flow --      Pain Score 12/25/21 2124 8   Constitutional: Alert and oriented.  Eyes: Conjunctivae are normal.  ENT      Head: Normocephalic and atraumatic.      Nose: No congestion/rhinnorhea.      Mouth/Throat: Mucous  membranes are moist.      Neck: No stridor. Hematological/Lymphatic/Immunilogical: No cervical lymphadenopathy. Cardiovascular: Normal rate, regular rhythm.  No murmurs, rubs, or gallops.  Respiratory: Normal respiratory effort without tachypnea nor retractions. Breath sounds are clear and equal bilaterally. No wheezes/rales/rhonchi. Gastrointestinal: Soft and non tender. No rebound. No guarding.  Genitourinary: Deferred Musculoskeletal: Left wrist and ankles with some swelling and warmth. Tender to manipulation. Neurologic:  Normal speech and language. No gross focal neurologic deficits are appreciated.  Skin:  Skin is warm, dry and intact. No rash noted. Psychiatric: Mood and affect are normal. Speech and behavior are normal. Patient exhibits appropriate insight and judgment.  ____________________________________________    LABS (pertinent positives/negatives)  CBC wbc 15.3, hgb 9.9, plt 416 BMP na 132, k 4.1, glu 123, cr 2.40 ____________________________________________   EKG  I, Nance Pear, attending physician, personally viewed and interpreted this EKG  EKG Time: 2132 Rate: 93 Rhythm: normal sinus rhythm Axis: left axis deviation Intervals: qtc 467 QRS: narrow ST changes: no st elevation, st depression v2-v6 Impression: abnormal ekg   ____________________________________________    RADIOLOGY  Imaging pending  ____________________________________________   PROCEDURES  Procedures  ____________________________________________   INITIAL IMPRESSION / ASSESSMENT AND PLAN / ED COURSE  Pertinent labs & imaging results that were available during my care of the patient were reviewed by me and considered in my medical decision making (see chart for details).   Patient presented to the emergency department today because of concerns for gout flare with pain to his left wrist and bilateral ankles.  Sam of his left wrist and ankles is consistent with gout.  However  upon arrival to the emergency department the patient was found to be hypotensive.  He did have a recent admission for diverticulitis.  Patient's white blood cell count is elevated here over discharge blood work.  Do have concern for recurrent infection at this time.  Will start IV antibiotics.  Will start infection work-up.  ____________________________________________   FINAL CLINICAL IMPRESSION(S) / ED DIAGNOSES  Gout flare Leukocytosis. AKI  Note: This dictation was prepared with Dragon dictation. Any transcriptional errors that result from this process are unintentional     Nance Pear, MD 12/25/21 2349

## 2021-12-25 NOTE — ED Provider Notes (Signed)
----------------------------------------- 11:53 PM on 12/25/2021 -----------------------------------------  Assuming care from Dr. Archie Balboa.  In short, Steven Pham is a 60 y.o. male with a chief complaint of gout pain.  Refer to the original H&P for additional details.  The current plan of care is to follow up CT and admit for renal failure.   ----------------------------------------- 12:54 AM on 12/26/2021 -----------------------------------------  Radiologist called to discuss CT results.  Patient has much improved small bowel diverticulitis, but now has portal vein thrombus.  No obvious ischemia changes, but evaluation limited due to non-contrast study.  Will order heparin bolus + infusion, order RUQ ultrasound with duplex to evaluate portal vein patency, and consult hospitalist for admission.   CT report not crossing over from imaging system, but report is as follows:  CLINICAL DATA: Abdominal pain. Concern for acute diverticulitis.  EXAM: CT ABDOMEN AND PELVIS WITHOUT CONTRAST  TECHNIQUE: Multidetector CT imaging of the abdomen and pelvis was performed following the standard protocol without IV contrast.  COMPARISON: CT abdomen pelvis dated 12/14/2021.  FINDINGS: Evaluation of this exam is limited in the absence of intravenous contrast.  Lower chest: Minimal bibasilar atelectasis. The visualized lung bases are otherwise clear.  No intra-abdominal free air or free fluid.  Hepatobiliary: The liver is unremarkable. No intrahepatic dilatation. The gallbladder is contracted. No calcified gallstone.  Pancreas: The pancreas is mildly atrophic. Evaluation of the pancreas is limited in the absence of intravenous contrast.  Spleen: Normal in size without focal abnormality.  Adrenals/Urinary Tract: The adrenal glands are unremarkable. There is no hydronephrosis or nephrolithiasis on either side. There is a 5 cm left renal inferior pole cyst and a smaller hypodensity which  is not characterized. The visualized ureters and urinary bladder appear unremarkable.  Stomach/Bowel: There is distal colonic diverticulosis without active inflammatory changes. Inflammatory changes of the small bowel mesentery as seen on the prior CT and corresponding to the small-bowel diverticulitis. There is no bowel obstruction. The appendix is normal.  Vascular/Lymphatic: Mild aortoiliac atherosclerotic disease. The IVC is unremarkable. There is again air within the SMV and main portal vein. There is high attenuating content in the left portal vein most concerning for portal vein thrombosis. Evaluation is very limited, almost nondiagnostic on a noncontrast CT. Repeat CT with IV contrast in portal venous phase or interrogation of the portal vein with duplex ultrasound may provide better evaluation. Overall there is increased SMV and portal venous gas compared to prior CT.  Reproductive: The prostate and seminal vesicles are grossly unremarkable. No pelvic mass.  Other: Small fat containing umbilical hernia  Musculoskeletal: With degenerative changes of the spine. No acute osseous pathology.  IMPRESSION: 1. Increase in the amount of gas within the SMV and main portal vein with findings concerning for thrombus extending from the main portal vein into the left portal vein. Repeat CT with IV contrast in portal venous phase or interrogation of the portal vein with duplex ultrasound may provide better evaluation. 2. Improved inflammatory changes of the small bowel mesentery since the prior CT. No drainable fluid collection or abscess. 3. Colonic diverticulosis. No bowel obstruction. Normal appendix. 4. Aortic Atherosclerosis (ICD10-I70.0).  These results were called by telephone at the time of interpretation on 12/26/2021 at 12:47 am to Dr. Karma Greaser, who verbally acknowledged these results.   Electronically Signed By: Anner Crete M.D. On: 12/26/2021 00:53     ----------------------------------------- 2:06 AM on 12/26/2021 -----------------------------------------  Started heparin bolus plus infusion.  Discussed case by phone with Dr. Lucky Cowboy with vascular surgery who agreed  with the plan.  Discussed case with Dr. Para March with the hospitalist service who will admit.  Ultrasound pending.  .Critical Care Performed by: Loleta Rose, MD Authorized by: Loleta Rose, MD   Critical care provider statement:    Critical care time (minutes):  30   Critical care time was exclusive of:  Separately billable procedures and treating other patients   Critical care was necessary to treat or prevent imminent or life-threatening deterioration of the following conditions:  Circulatory failure (portal vein thrombus requiring heparin bolus+infusion)   Critical care was time spent personally by me on the following activities:  Development of treatment plan with patient or surrogate, evaluation of patient's response to treatment, examination of patient, obtaining history from patient or surrogate, ordering and performing treatments and interventions, ordering and review of laboratory studies, ordering and review of radiographic studies, pulse oximetry, re-evaluation of patient's condition and review of old charts   Final diagnoses:  Thrombus  Portal vein thrombosis  Acute renal failure, unspecified acute renal failure type (HCC)  Acute gout, unspecified cause, unspecified site      Loleta Rose, MD 12/26/21 [phone]

## 2021-12-25 NOTE — ED Notes (Signed)
X-ray at bedside

## 2021-12-25 NOTE — ED Notes (Signed)
Abnormal EKG per Dr. Erma Heritage pt taken to room 17

## 2021-12-26 ENCOUNTER — Emergency Department: Payer: BC Managed Care – PPO

## 2021-12-26 DIAGNOSIS — I429 Cardiomyopathy, unspecified: Secondary | ICD-10-CM | POA: Diagnosis present

## 2021-12-26 DIAGNOSIS — M109 Gout, unspecified: Secondary | ICD-10-CM

## 2021-12-26 DIAGNOSIS — I959 Hypotension, unspecified: Secondary | ICD-10-CM | POA: Diagnosis present

## 2021-12-26 DIAGNOSIS — I700 Atherosclerosis of aorta: Secondary | ICD-10-CM | POA: Diagnosis present

## 2021-12-26 DIAGNOSIS — I5022 Chronic systolic (congestive) heart failure: Secondary | ICD-10-CM

## 2021-12-26 DIAGNOSIS — Z79899 Other long term (current) drug therapy: Secondary | ICD-10-CM | POA: Diagnosis not present

## 2021-12-26 DIAGNOSIS — M1990 Unspecified osteoarthritis, unspecified site: Secondary | ICD-10-CM | POA: Diagnosis present

## 2021-12-26 DIAGNOSIS — M1A9XX0 Chronic gout, unspecified, without tophus (tophi): Secondary | ICD-10-CM | POA: Diagnosis present

## 2021-12-26 DIAGNOSIS — Z8249 Family history of ischemic heart disease and other diseases of the circulatory system: Secondary | ICD-10-CM | POA: Diagnosis not present

## 2021-12-26 DIAGNOSIS — I110 Hypertensive heart disease with heart failure: Secondary | ICD-10-CM | POA: Diagnosis present

## 2021-12-26 DIAGNOSIS — Z20822 Contact with and (suspected) exposure to covid-19: Secondary | ICD-10-CM | POA: Diagnosis present

## 2021-12-26 DIAGNOSIS — D72829 Elevated white blood cell count, unspecified: Secondary | ICD-10-CM | POA: Diagnosis present

## 2021-12-26 DIAGNOSIS — I81 Portal vein thrombosis: Secondary | ICD-10-CM | POA: Diagnosis present

## 2021-12-26 DIAGNOSIS — E860 Dehydration: Secondary | ICD-10-CM | POA: Diagnosis present

## 2021-12-26 DIAGNOSIS — I5042 Chronic combined systolic (congestive) and diastolic (congestive) heart failure: Secondary | ICD-10-CM | POA: Diagnosis present

## 2021-12-26 DIAGNOSIS — N179 Acute kidney failure, unspecified: Secondary | ICD-10-CM | POA: Diagnosis present

## 2021-12-26 DIAGNOSIS — N170 Acute kidney failure with tubular necrosis: Secondary | ICD-10-CM | POA: Diagnosis present

## 2021-12-26 LAB — HEPARIN LEVEL (UNFRACTIONATED)
Heparin Unfractionated: 0.32 IU/mL (ref 0.30–0.70)
Heparin Unfractionated: <0.1 IU/mL — ABNORMAL LOW (ref 0.30–0.70)

## 2021-12-26 LAB — PROTIME-INR
INR: 1.4 — ABNORMAL HIGH (ref 0.8–1.2)
Prothrombin Time: 16.9 seconds — ABNORMAL HIGH (ref 11.4–15.2)

## 2021-12-26 LAB — RESP PANEL BY RT-PCR (FLU A&B, COVID) ARPGX2
Influenza A by PCR: NEGATIVE
Influenza B by PCR: NEGATIVE
SARS Coronavirus 2 by RT PCR: NEGATIVE

## 2021-12-26 LAB — BASIC METABOLIC PANEL
Anion gap: 9 (ref 5–15)
BUN: 20 mg/dL (ref 6–20)
CO2: 23 mmol/L (ref 22–32)
Calcium: 8.3 mg/dL — ABNORMAL LOW (ref 8.9–10.3)
Chloride: 102 mmol/L (ref 98–111)
Creatinine, Ser: 1.89 mg/dL — ABNORMAL HIGH (ref 0.61–1.24)
GFR, Estimated: 40 mL/min — ABNORMAL LOW (ref >60)
Glucose, Bld: 124 mg/dL — ABNORMAL HIGH (ref 70–99)
Potassium: 4 mmol/L (ref 3.5–5.1)
Sodium: 134 mmol/L — ABNORMAL LOW (ref 135–145)

## 2021-12-26 LAB — LACTIC ACID, PLASMA
Lactic Acid, Venous: 1.4 mmol/L (ref 0.5–1.9)
Lactic Acid, Venous: 1.3 mmol/L (ref 0.5–1.9)

## 2021-12-26 LAB — CBC
HCT: 30.2 % — ABNORMAL LOW (ref 39.0–52.0)
Hemoglobin: 10.2 g/dL — ABNORMAL LOW (ref 13.0–17.0)
MCH: 25.3 pg — ABNORMAL LOW (ref 26.0–34.0)
MCHC: 33.8 g/dL (ref 30.0–36.0)
MCV: 74.9 fL — ABNORMAL LOW (ref 80.0–100.0)
Platelets: 431 10*3/uL — ABNORMAL HIGH (ref 150–400)
RBC: 4.03 MIL/uL — ABNORMAL LOW (ref 4.22–5.81)
RDW: 15.7 % — ABNORMAL HIGH (ref 11.5–15.5)
WBC: 11.8 10*3/uL — ABNORMAL HIGH (ref 4.0–10.5)
nRBC: 0 % (ref 0.0–0.2)

## 2021-12-26 LAB — TROPONIN I (HIGH SENSITIVITY): Troponin I (High Sensitivity): 13 ng/L (ref <18)

## 2021-12-26 LAB — BRAIN NATRIURETIC PEPTIDE: B Natriuretic Peptide: 232.7 pg/mL — ABNORMAL HIGH (ref 0.0–100.0)

## 2021-12-26 LAB — APTT: aPTT: 69 seconds — ABNORMAL HIGH (ref 24–36)

## 2021-12-26 MED ORDER — METHYLPREDNISOLONE SODIUM SUCC 40 MG IJ SOLR
40.0000 mg | Freq: Once | INTRAMUSCULAR | Status: AC
  Administered 2021-12-26: 05:00:00 40 mg via INTRAVENOUS
  Filled 2021-12-26: qty 1

## 2021-12-26 MED ORDER — ENOXAPARIN SODIUM 40 MG/0.4ML IJ SOSY: 40.0000 mg | PREFILLED_SYRINGE | INTRAMUSCULAR | Status: DC

## 2021-12-26 MED ORDER — ACETAMINOPHEN 325 MG PO TABS
650.0000 mg | ORAL_TABLET | Freq: Four times a day (QID) | ORAL | Status: DC | PRN
  Administered 2021-12-26 – 2021-12-27 (×2): 650 mg via ORAL
  Filled 2021-12-26 (×2): qty 2

## 2021-12-26 MED ORDER — ONDANSETRON HCL 4 MG/2ML IJ SOLN: 4.0000 mg | Freq: Four times a day (QID) | INTRAMUSCULAR | Status: DC | PRN

## 2021-12-26 MED ORDER — ONDANSETRON HCL 4 MG PO TABS: 4.0000 mg | ORAL_TABLET | Freq: Four times a day (QID) | ORAL | Status: DC | PRN

## 2021-12-26 MED ORDER — COLCHICINE 0.6 MG PO TABS
0.6000 mg | ORAL_TABLET | Freq: Two times a day (BID) | ORAL | Status: DC
  Administered 2021-12-26 – 2021-12-27 (×4): 0.6 mg via ORAL
  Filled 2021-12-26 (×5): qty 1

## 2021-12-26 MED ORDER — SODIUM CHLORIDE 0.9 % IV SOLN: INTRAVENOUS | Status: DC

## 2021-12-26 MED ORDER — ACETAMINOPHEN 650 MG RE SUPP: 650.0000 mg | Freq: Four times a day (QID) | RECTAL | Status: DC | PRN

## 2021-12-26 MED ORDER — APIXABAN 5 MG PO TABS
10.0000 mg | ORAL_TABLET | Freq: Two times a day (BID) | ORAL | Status: DC
  Administered 2021-12-26 – 2021-12-27 (×2): 10 mg via ORAL
  Filled 2021-12-26 (×2): qty 2

## 2021-12-26 MED ORDER — PREDNISONE 20 MG PO TABS
40.0000 mg | ORAL_TABLET | Freq: Every day | ORAL | Status: DC
  Administered 2021-12-26 – 2021-12-27 (×2): 40 mg via ORAL
  Filled 2021-12-26 (×2): qty 2

## 2021-12-26 MED ORDER — HEPARIN (PORCINE) 25000 UT/250ML-% IV SOLN
1350.0000 [IU]/h | INTRAVENOUS | Status: AC
  Administered 2021-12-26: 03:00:00 1350 [IU]/h via INTRAVENOUS
  Filled 2021-12-26: qty 250

## 2021-12-26 MED ORDER — HEPARIN BOLUS VIA INFUSION
5500.0000 [IU] | Freq: Once | INTRAVENOUS | Status: AC
  Administered 2021-12-26: 03:00:00 5500 [IU] via INTRAVENOUS
  Filled 2021-12-26: qty 5500

## 2021-12-26 MED ORDER — APIXABAN 5 MG PO TABS: 5.0000 mg | ORAL_TABLET | Freq: Two times a day (BID) | ORAL | Status: DC

## 2021-12-26 NOTE — ED Notes (Signed)
US at bedside

## 2021-12-26 NOTE — Progress Notes (Signed)
ANTICOAGULATION CONSULT NOTE  Pharmacy Consult for heparin infusion Indication: Portal Vein Thrombosis  No Known Allergies  Patient Measurements: Height: 5\' 2"  (157.5 cm) Weight: 111.1 kg (245 lb) IBW/kg (Calculated) : 54.6 Heparin Dosing Weight: 81.1 kg  Vital Signs: BP: 116/74 (12/28 1500) Pulse Rate: 69 (12/28 1500)  Labs: Recent Labs    12/25/21 2244 12/26/21 0808 12/26/21 0933  HGB 9.9* 10.2*  --   HCT 29.2* 30.2*  --   PLT 416* 431*  --   APTT  --   --  69*  LABPROT  --   --  16.9*  INR  --   --  1.4*  HEPARINUNFRC  --   --  0.32  CREATININE 2.40* 1.89*  --   TROPONINIHS 15 13  --      Estimated Creatinine Clearance: 45.4 mL/min (A) (by C-G formula based on SCr of 1.89 mg/dL (H)).   Medical History: Past Medical History:  Diagnosis Date   Arthritis    Cardiomyopathy (HCC)    a. 11/2021 Echo: EF 20-25%, glob HK. GrIII DD. Nl RV size/fxn. Mildly elev PASP. Sev dil LA, mildly dil RA. Mild to mod MR.   Gout    Hypertension    NSVT (nonsustained ventricular tachycardia) 11/2021   PSVT (paroxysmal supraventricular tachycardia) (HCC) 11/2021    Assessment: Pt is 60 yo male presenting to ED c/o arthritis & gout pain, found with portal vein thrombosis. Pharmacy has been consulted for heparin dosing/monitoring.   12/28 0933 HL 0.32, therapeutic   Hgb stable @ 10, no bleeding per notes. Plts WNL.  Planning to transition to DOAC.  Goal of Therapy:  Monitor platelets by anticoagulation protocol: Yes  Plan:  Discontinue heparin gtt at the time of apixaban administration. Give apixaban 10 mg BID x 7 days followed by apixaban 5 mg BID. CBC daily   1/29, PharmD Pharmacy Resident  12/26/2021 3:54 PM

## 2021-12-26 NOTE — ED Notes (Signed)
Pt reports the colchicine has significantly helped the pain in his feet.  Asking questions regarding prescriptions and refills.  Pt made aware this would be covered at d/c.

## 2021-12-26 NOTE — Consult Note (Signed)
[facility] Vascular Consult Note  MRN : [id number]  Steven Pham is a 60 y.o. (Mar 31, 1961) male who presents with chief complaint of  Chief Complaint  Patient presents with   Gout   Arthritis  .  History of Present Illness: I am asked to see the patient by Dr. Karma Greaser in the ER for portal vein thrombosis.  The patient has had an intra-abdominal inflammatory process of some sort of diverticulitis over the past couple of weeks.  He also had significant hypotension and had acute kidney injury on labs today.  He had an uninfused CT scan which I have independently reviewed which is suggestive of portal vein thrombosis.  He was appropriately started on heparin by the emergency room and since that time, his abdominal pain has improved.  He does not have any severe tenderness and at this point he says he has no abdominal pain.  He is complaining more of his gout pain.  We are consulted for further evaluation and treatment of his portal vein thrombosis.  Current Facility-Administered Medications  Medication Dose Route Frequency Provider Last Rate Last Admin   0.9 %  sodium chloride infusion   Intravenous Continuous Athena Masse, MD 100 mL/hr at 12/26/21 0314 New Bag at 12/26/21 0314   acetaminophen (TYLENOL) tablet 650 mg  650 mg Oral Q6H PRN Athena Masse, MD       Or   acetaminophen (TYLENOL) suppository 650 mg  650 mg Rectal Q6H PRN Athena Masse, MD       colchicine tablet 0.6 mg  0.6 mg Oral BID Judd Gaudier V, MD   0.6 mg at 12/26/21 0408   heparin ADULT infusion 100 units/mL (25000 units/234mL)  1,350 Units/hr Intravenous Continuous Renda Rolls, RPH 13.5 mL/hr at 12/26/21 0312 1,350 Units/hr at 12/26/21 0312   ondansetron (ZOFRAN) tablet 4 mg  4 mg Oral Q6H PRN Athena Masse, MD       Or   ondansetron [facility]) injection 4 mg  4 mg Intravenous Q6H PRN Athena Masse, MD       predniSONE (DELTASONE) tablet 40 mg  40 mg Oral Q breakfast Athena Masse, MD    40 mg at 12/26/21 [id number]   Current Outpatient Medications  Medication Sig Dispense Refill   amoxicillin-clavulanate (AUGMENTIN) 875-125 MG tablet Take 1 tablet by mouth every 12 (twelve) hours for 8 days. 16 tablet 0   carvedilol (COREG) 6.25 MG tablet Take 1 tablet (6.25 mg total) by mouth 2 (two) times daily with a meal. 60 tablet 2   losartan (COZAAR) 25 MG tablet Take 0.5 tablets (12.5 mg total) by mouth daily. 15 tablet 2   spironolactone (ALDACTONE) 25 MG tablet Take 0.5 tablets (12.5 mg total) by mouth daily. 15 tablet 2    Past Medical History:  Diagnosis Date   Arthritis    Cardiomyopathy ([place])    a. 11/2021 Echo: EF 20-25%, glob HK. GrIII DD. Nl RV size/fxn. Mildly elev PASP. Sev dil LA, mildly dil RA. Mild to mod MR.   Gout    Hypertension    NSVT (nonsustained ventricular tachycardia) 11/2021   PSVT (paroxysmal supraventricular tachycardia) ([place]) 11/2021    History reviewed. No pertinent surgical history.  Social History Social History   Tobacco Use   Smoking status: Never   Smokeless tobacco: Never  Substance Use Topics   Alcohol use: No   Drug use: No    Family History No bleeding disorders, clotting disorders, autoimmune  diseases, or aneurysms  No Known Allergies   REVIEW OF SYSTEMS (Negative unless checked)  Constitutional: [] Weight loss  [] Fever  [] Chills Cardiac: [] Chest pain   [] Chest pressure   [] Palpitations   [] Shortness of breath when laying flat   [] Shortness of breath at rest   [] Shortness of breath with exertion. Vascular:  [] Pain in legs with walking   [] Pain in legs at rest   [] Pain in legs when laying flat   [] Claudication   [] Pain in feet when walking  [] Pain in feet at rest  [] Pain in feet when laying flat   [] History of DVT   [] Phlebitis   [x] Swelling in legs   [] Varicose veins   [] Non-healing ulcers Pulmonary:   [] Uses home oxygen   [] Productive cough   [] Hemoptysis   [] Wheeze  [] COPD   [] Asthma Neurologic:  [] Dizziness  [] Blackouts    [] Seizures   [] History of stroke   [] History of TIA  [] Aphasia   [] Temporary blindness   [] Dysphagia   [] Weakness or numbness in arms   [] Weakness or numbness in legs Musculoskeletal:  [x] Arthritis   [] Joint swelling   [x] Joint pain   [] Low back pain Hematologic:  [] Easy bruising  [] Easy bleeding   [] Hypercoagulable state   [] Anemic  [] Hepatitis Gastrointestinal:  [] Blood in stool   [] Vomiting blood  [x] Gastroesophageal reflux/heartburn   [] Difficulty swallowing. Genitourinary:  [x] Chronic kidney disease   [] Difficult urination  [] Frequent urination  [] Burning with urination   [] Blood in urine Skin:  [] Rashes   [] Ulcers   [] Wounds Psychological:  [] History of anxiety   []  History of major depression.  Physical Examination  Vitals:   12/26/21 0500 12/26/21 0600 12/26/21 0630 12/26/21 0700  BP: 99/82 124/78 111/77 109/72  Pulse: 83 78 77 77  Resp: 16     Temp:      TempSrc:      SpO2: 97% 100% 96% 97%  Weight:      Height:       Body mass index is 44.81 kg/m. Gen:  WD/WN, NAD Head: [place]/AT, No temporalis wasting.  Ear/Nose/Throat: Hearing grossly intact, nares w/o erythema or drainage, oropharynx w/o Erythema/Exudate Eyes: Sclera non-icteric, conjunctiva clear Neck: Trachea midline.  No JVD.  Pulmonary:  Good air movement, respirations not labored, equal bilaterally.  Cardiac: RRR, normal S1, S2. Vascular:  Vessel Right Left  Radial Palpable Palpable                                   Gastrointestinal: soft, non-tender/non-distended. No guarding/reflex.  Musculoskeletal: M/S 5/5 throughout.  Extremities without ischemic changes.  No deformity or atrophy. No edema. Neurologic: Sensation grossly intact in extremities.  Symmetrical.  Speech is fluent. Motor exam as listed above. Psychiatric: Judgment intact, Mood & affect appropriate for pt's clinical situation. Dermatologic: No rashes or ulcers noted.  No cellulitis or open wounds.      CBC Lab Results  Component Value  Date   WBC 15.3 (H) 12/25/2021   HGB 9.9 (L) 12/25/2021   HCT 29.2 (L) 12/25/2021   MCV 74.7 (L) 12/25/2021   PLT 416 (H) 12/25/2021    BMET    Component Value Date/Time   NA 132 (L) 12/25/2021 2244   K 4.1 12/25/2021 2244   CL 97 (L) 12/25/2021 2244   CO2 23 12/25/2021 2244   GLUCOSE 123 (H) 12/25/2021 2244   BUN 18 12/25/2021 2244   CREATININE 2.40 (H) 12/25/2021 2244  CALCIUM 8.3 (L) 12/25/2021 2244   GFRNONAA 30 (L) 12/25/2021 2244   GFRAA >60 10/30/2019 1051   Estimated Creatinine Clearance: 35.7 mL/min (A) (by C-G formula based on SCr of 2.4 mg/dL (H)).  COAG Lab Results  Component Value Date   INR 1.3 (H) 12/15/2021    Radiology CT ABDOMEN PELVIS WO CONTRAST  Result Date: 12/26/2021 CLINICAL DATA:  Abdominal pain.  Concern for acute diverticulitis. EXAM: CT ABDOMEN AND PELVIS WITHOUT CONTRAST TECHNIQUE: Multidetector CT imaging of the abdomen and pelvis was performed following the standard protocol without IV contrast. COMPARISON:  CT abdomen pelvis dated 12/14/2021. FINDINGS: Evaluation of this exam is limited in the absence of intravenous contrast. Lower chest: Minimal bibasilar atelectasis. The visualized lung bases are otherwise clear. No intra-abdominal free air or free fluid. Hepatobiliary: The liver is unremarkable. No intrahepatic dilatation. The gallbladder is contracted. No calcified gallstone. Pancreas: The pancreas is mildly atrophic. Evaluation of the pancreas is limited in the absence of intravenous contrast. Spleen: Normal in size without focal abnormality. Adrenals/Urinary Tract: The adrenal glands are unremarkable. There is no hydronephrosis or nephrolithiasis on either side. There is a 5 cm left renal inferior pole cyst and a smaller hypodensity which is not characterized. The visualized ureters and urinary bladder appear unremarkable. Stomach/Bowel: There is distal colonic diverticulosis without active inflammatory changes. Inflammatory changes of the  small bowel mesentery as seen on the prior CT and corresponding to the small-bowel diverticulitis. There is no bowel obstruction. The appendix is normal. Vascular/Lymphatic: Mild aortoiliac atherosclerotic disease. The IVC is unremarkable. There is again air within the SMV and main portal vein. There is high attenuating content in the left portal vein most concerning for portal vein thrombosis. Evaluation is very limited, almost nondiagnostic on a noncontrast CT. Repeat CT with IV contrast in portal venous phase or interrogation of the portal vein with duplex ultrasound may provide better evaluation. Overall there is increased SMV and portal venous gas compared to prior CT. Reproductive: The prostate and seminal vesicles are grossly unremarkable. No pelvic mass. Other: Small fat containing umbilical hernia Musculoskeletal: With degenerative changes of the spine. No acute osseous pathology. IMPRESSION: 1. Increase in the amount of gas within the SMV and main portal vein with findings concerning for thrombus extending from the main portal vein into the left portal vein. Repeat CT with IV contrast in portal venous phase or interrogation of the portal vein with duplex ultrasound may provide better evaluation. 2. Improved inflammatory changes of the small bowel mesentery since the prior CT. No drainable fluid collection or abscess. 3. Colonic diverticulosis. No bowel obstruction. Normal appendix. 4. Aortic Atherosclerosis (ICD10-I70.0). These results were called by telephone at the time of interpretation on 12/26/2021 at 12:47 am to Dr. Karma Greaser, who verbally acknowledged these results. Electronically Signed   By: Anner Crete M.D.   On: 12/26/2021 00:53   DG Chest 1 View  Result Date: 12/14/2021 CLINICAL DATA:  Upper abdominal pain and vomiting. EXAM: CHEST  1 VIEW COMPARISON:  May 13, 2021 FINDINGS: Very mild, stable atelectasis is seen within the bilateral lung bases. There is no evidence of a pleural effusion  or pneumothorax. The heart size and mediastinal contours are within normal limits. The visualized skeletal structures are unremarkable. IMPRESSION: Very mild bibasilar atelectasis. Electronically Signed   By: Virgina Norfolk M.D.   On: 12/14/2021 20:34   CT ABDOMEN PELVIS W CONTRAST  Result Date: 12/14/2021 CLINICAL DATA:  Upper abdominal pain for 2 days EXAM: CT ABDOMEN AND  PELVIS WITH CONTRAST TECHNIQUE: Multidetector CT imaging of the abdomen and pelvis was performed using the standard protocol following bolus administration of intravenous contrast. CONTRAST:  52mL OMNIPAQUE IOHEXOL 300 MG/ML  SOLN COMPARISON:  None. FINDINGS: Lower chest: No acute abnormality. Hepatobiliary: No focal liver abnormality is seen. No gallstones, gallbladder wall thickening, or biliary dilatation. Pancreas: Unremarkable. No pancreatic ductal dilatation or surrounding inflammatory changes. Spleen: Normal in size without focal abnormality. Adrenals/Urinary Tract: Adrenal glands are within normal limits. Kidneys demonstrate a normal enhancement pattern bilaterally. Large simple cyst is noted arising from the lower pole of the left kidney measuring 5 cm in greatest dimension. No obstructive changes are seen. The ureters are within normal limits. The bladder is partially distended. Stomach/Bowel: No obstructive or inflammatory changes of colon are seen. The appendix is within normal limits. Stomach is within normal limits. There are multiple fluid-filled outpouchings in the mid jejunum consistent with small bowel diverticular change. Some associated inflammatory changes are noted in the adjacent mesentery with findings suggestive of early venous air related to the inflammatory change. No definitive portal venous air is noted. Vascular/Lymphatic: Atherosclerotic calcifications are seen. Some inflammatory changes are noted in the root of the small bowel mesentery in the left mid abdomen as described above. Some linear areas of air  are noted in the inflamed mesentery and the possibility of early SMV air could not be totally excluded. Reproductive: Prostate is unremarkable. Other: Small fat containing umbilical hernia is noted. No abdominopelvic ascites. Musculoskeletal: No acute or significant osseous findings. IMPRESSION: Changes consistent with small bowel diverticulitis with inflammatory changes in the mesentery in the left mid abdomen as well as some very early venous air adjacent to the small bowel loops. Left renal cyst. Critical Value/emergent results were called by telephone at the time of interpretation on 12/14/2021 at 10:34 pm to Dr. Conni Slipper , who verbally acknowledged these results. Electronically Signed   By: Inez Catalina M.D.   On: 12/14/2021 22:38   DG Chest Portable 1 View  Result Date: 12/26/2021 CLINICAL DATA:  Bilateral ankle and left wrist pain with gout, arthritis and an abnormal EKG. EXAM: PORTABLE CHEST 1 VIEW COMPARISON:  December 14, 2021 FINDINGS: The heart size and mediastinal contours are within normal limits. Both lungs are clear. The visualized skeletal structures are unremarkable. IMPRESSION: No active disease. Electronically Signed   By: Virgina Norfolk M.D.   On: 12/26/2021 00:04   ECHOCARDIOGRAM COMPLETE  Result Date: 12/18/2021    ECHOCARDIOGRAM REPORT   Patient Name:   Brylan Yoo Date of Exam: 12/18/2021 Medical Rec #:  [id number]    Height:       62.0 in Accession #:    [id number]   Weight:       256.0 lb Date of Birth:  [birth year]    BSA:          2.123 m Patient Age:    80 years     BP:           114/86 mmHg Patient Gender: M            HR:           82 bpm. Exam Location:  [facility] Procedure: 2D Echo, Cardiac Doppler and Color Doppler Indications:     Abnormal ECG R 94.31  History:         Patient has no prior history of Echocardiogram examinations.  Risk Factors:Hypertension.  Sonographer:     Sherrie Sport Referring Phys:  [id number] Barb Merino Diagnosing Phys: Kathlyn Sacramento  MD  Sonographer Comments: Suboptimal apical window. IMPRESSIONS  1. Left ventricular ejection fraction, by estimation, is 20 to 25%. The left ventricle has severely decreased function. The left ventricle demonstrates global hypokinesis. The left ventricular internal cavity size was mildly dilated. There is mild left ventricular hypertrophy. Left ventricular diastolic parameters are consistent with Grade III diastolic dysfunction (restrictive).  2. Right ventricular systolic function is normal. The right ventricular size is normal. There is mildly elevated pulmonary artery systolic pressure.  3. Left atrial size was severely dilated.  4. Right atrial size was mildly dilated.  5. The mitral valve is normal in structure. Mild to moderate mitral valve regurgitation. No evidence of mitral stenosis.  6. The aortic valve is normal in structure. Aortic valve regurgitation is not visualized. No aortic stenosis is present.  7. The inferior vena cava is normal in size with greater than 50% respiratory variability, suggesting right atrial pressure of 3 mmHg. FINDINGS  Left Ventricle: Left ventricular ejection fraction, by estimation, is 20 to 25%. The left ventricle has severely decreased function. The left ventricle demonstrates global hypokinesis. The left ventricular internal cavity size was mildly dilated. There is mild left ventricular hypertrophy. Left ventricular diastolic parameters are consistent with Grade III diastolic dysfunction (restrictive). Right Ventricle: The right ventricular size is normal. No increase in right ventricular wall thickness. Right ventricular systolic function is normal. There is mildly elevated pulmonary artery systolic pressure. The tricuspid regurgitant velocity is 3.22  m/s, and with an assumed right atrial pressure of 3 mmHg, the estimated right ventricular systolic pressure is [id number]. Left Atrium: Left atrial size was severely dilated. Right Atrium: Right atrial size was mildly  dilated. Pericardium: There is no evidence of pericardial effusion. Mitral Valve: The mitral valve is normal in structure. Mild to moderate mitral valve regurgitation. No evidence of mitral valve stenosis. Tricuspid Valve: The tricuspid valve is normal in structure. Tricuspid valve regurgitation is mild . No evidence of tricuspid stenosis. Aortic Valve: The aortic valve is normal in structure. Aortic valve regurgitation is not visualized. No aortic stenosis is present. Aortic valve mean gradient measures 3.0 mmHg. Aortic valve peak gradient measures 4.0 mmHg. Aortic valve area, by VTI measures 2.50 cm. Pulmonic Valve: The pulmonic valve was normal in structure. Pulmonic valve regurgitation is not visualized. No evidence of pulmonic stenosis. Aorta: The aortic root is normal in size and structure. Venous: The inferior vena cava is normal in size with greater than 50% respiratory variability, suggesting right atrial pressure of 3 mmHg. IAS/Shunts: No atrial level shunt detected by color flow Doppler.  LEFT VENTRICLE PLAX 2D LVIDd:         5.00 cm      Diastology LVIDs:         4.60 cm      LV e' medial:    4.35 cm/s LV PW:         1.50 cm      LV E/e' medial:  22.4 LV IVS:        1.10 cm      LV e' lateral:   6.53 cm/s LVOT diam:     2.10 cm      LV E/e' lateral: 14.9 LV SV:         44 LV SV Index:   21 LVOT Area:     3.46 cm  LV Volumes (MOD) LV  vol d, MOD A2C: 190.0 ml LV vol d, MOD A4C: 213.0 ml LV vol s, MOD A2C: 144.0 ml LV vol s, MOD A4C: 165.0 ml LV SV MOD A2C:     46.0 ml LV SV MOD A4C:     213.0 ml LV SV MOD BP:      46.4 ml RIGHT VENTRICLE RV Basal diam:  4.30 cm TAPSE (M-mode): 4.2 cm LEFT ATRIUM              Index        RIGHT ATRIUM           Index LA diam:        5.10 cm  2.40 cm/m   RA Area:     23.90 cm LA Vol (A2C):   91.9 ml  43.28 ml/m  RA Volume:   67.40 ml  31.75 ml/m LA Vol (A4C):   146.0 ml 68.77 ml/m LA Biplane Vol: 116.0 ml 54.64 ml/m  AORTIC VALVE                    PULMONIC VALVE AV  Area (Vmax):    2.17 cm     PV Vmax:        0.68 m/s AV Area (Vmean):   2.27 cm     PV Vmean:       42.100 cm/s AV Area (VTI):     2.50 cm     PV VTI:         0.088 m AV Vmax:           100.00 cm/s  PV Peak grad:   1.8 mmHg AV Vmean:          76.200 cm/s  PV Mean grad:   1.0 mmHg AV VTI:            0.176 m      RVOT Peak grad: 4 mmHg AV Peak Grad:      4.0 mmHg AV Mean Grad:      3.0 mmHg LVOT Vmax:         62.70 cm/s LVOT Vmean:        50.000 cm/s LVOT VTI:          0.127 m LVOT/AV VTI ratio: 0.72  AORTA Ao Root diam: 3.80 cm MITRAL VALVE               TRICUSPID VALVE MV Area (PHT): 5.75 cm    TR Peak grad:   41.5 mmHg MV Decel Time: 132 msec    TR Vmax:        322.00 cm/s MV E velocity: 97.30 cm/s MV A velocity: 35.60 cm/s  SHUNTS MV E/A ratio:  2.73        Systemic VTI:  0.13 m                            Systemic Diam: 2.10 cm                            Pulmonic VTI:  0.116 m Kathlyn Sacramento MD Electronically signed by Kathlyn Sacramento MD Signature Date/Time: 12/18/2021/11:51:30 AM    Final       Assessment/Plan 1.  Portal vein thrombosis.  His intra-abdominal inflammation as well as dehydration and hypotension may have precipitated this.  I have independently reviewed his uninfused CT scan and this is suggestive of this.  A duplex  study will be helpful although I am not sure the accuracy of the studies at our institution currently.  He seems to be doing well on anticoagulation I would plan on continuing this.  No role for intervention in this situation. 2.  Hypotension.  This may have helped precipitate the portal vein thrombosis.  Medicine managing his blood pressure medications 3.  Acute kidney injury.  Likely secondary to hypotension.  Would not give a contrast load at this time for full assessment of the possible portal venous thrombosis.  This can be done with duplex studies 4.  Gout pain.  The patient says that is his biggest complaint at this time.  Avoid NSAIDs due to renal  insufficiency.   Festus Barren, MD  12/26/2021 7:58 AM    This note was created with Dragon medical transcription system.  Any error is purely unintentional

## 2021-12-26 NOTE — H&P (Signed)
History and Physical    Steven Pham [id number] DOB: July 29, 1961 DOA: 12/25/2021  PCP: Lianne Bushy, MD   Patient coming from: home  I have personally briefly reviewed patient's relevant medical records in [place]  Chief Complaint: gout flare  HPI: Steven Pham is a 60 y.o. male with medical history significant for Gout, HTN, combined CHF recently diagnosed with EF 20 to 25% and grade 3 DD, pending outpatient ischemic work-up, recently hospitalized from 12/16-12/21 with sepsis secondary to acute diverticulitis and acute kidney injury, who returns to the ED with a complaint of gout flareup.  During his evaluation in the ED he was found to be hypotensive with BP 76/48 and otherwise normal vitals.  He otherwise denied chest pain, shortness of breath, lightheadedness, cough fever chills, nausea, vomiting, abdominal pain and diarrhea  ED course: BP on arrival 76/48 improving to 113/78 but again trending down to 95/71 by admission.  T-max 99.8 with pulse 83-99.  O2 sat 95 to 100% on room air Blood work WBC 15,000, up from 6 a week ago, with hemoglobin 9.9, lactic acid 1.6 Troponin 15, BNP 232 Creatinine 2.4, up from 1.09 a week ago BNP 232  EKG, personally viewed and interpreted: NSR at 95 with prolonged QT and nonspecific ST-T wave abnormalities  Chest x-ray clear CT abdomen and pelvis showed findings concerning for thrombus extending from the main portal vein into the left portal vein with recommendation for repeat CT with IV contrast.  Improved inflammatory changes of small bowel  The ED provider spoke with vascular surgeon, Dr. Lucky Cowboy who recommended IV heparin and will see in AM.  Hospitalist consulted for admission.   Review of Systems: As per HPI otherwise all other systems on review of systems negative.   Assessment/Plan  Acute portal vein thrombosis - Suspect facilitated by hypotension, dehydration hemoconcentration - Continue Heparin infusion - Vascular consult     Hypotension with history of hypertension - Likely secondary to multiple recently initiated medications including diuretics for newly diagnosed CHF - Hold Aldactone, losartan, carvedilol and Lasix and resume as appropriate - IV hydration    Leukocytosis - WBC 15,000 but without evidence of recurrence and diverticulitis or other acute infection - Uncertain whether patient had steroids for current gout flare - We will hold off on antibiotics - Monitor trend with IV hydration    AKI (acute kidney injury) ([place]) - Likely ATN secondary to renal hypoperfusion from hypotension -Hold Aldactone, losartan, Coreg - Monitor renal function and avoid nephrotoxins    Gout flare - Colchicine and prednisone.  Avoid NSAIDs    Chronic combined systolic and diastolic CHF (congestive heart failure) (HCC) - Holding all meds, carvedilol, losartan and spironolactone - Daily weights - EF 20 to 25% so we will monitor for worsening with IV fluid hydration   DVT prophylaxis: heparin Code Status: full code  Family Communication:  none  Disposition Plan: Back to previous home environment Consults called: Vascular Status:At the time of admission, it appears that the appropriate admission status for this patient is INPATIENT. This is judged to be reasonable and necessary in order to provide the required intensity of service to ensure the patient's safety given the presenting symptoms, physical exam findings, and initial radiographic and laboratory data in the context of their  Comorbid conditions.   Patient requires inpatient status due to high intensity of service, high risk for further deterioration and high frequency of surveillance required.   I certify that at the point of admission it is  my clinical judgment that the patient will require inpatient hospital care spanning beyond 2 midnights     Physical Exam: Vitals:   12/26/21 0036 12/26/21 0100 12/26/21 0130 12/26/21 0200  BP: 100/70 113/78 105/71 95/71   Pulse: 93 84 84 83  Resp: 18  18 17   Temp:      TempSrc:      SpO2: 96% 96% 96% 100%  Weight:      Height:       Constitutional: Alert, oriented x 3 . Not in any apparent distress HEENT:      Head: Normocephalic and atraumatic.         Eyes: PERLA, EOMI, Conjunctivae are normal. Sclera is non-icteric.       Mouth/Throat: Mucous membranes are moist.       Neck: Supple with no signs of meningismus. Cardiovascular: Regular rate and rhythm. No murmurs, gallops, or rubs. 2+ symmetrical distal pulses are present . No JVD. 2+LE edema Respiratory: Respiratory effort normal .Lungs sounds clear bilaterally. No wheezes, crackles, or rhonchi.  Gastrointestinal: Soft, non tender, non distended. Positive bowel sounds.  Genitourinary: No CVA tenderness. Musculoskeletal: pain and swelling in wrists. No cyanosis, or erythema of extremities. Neurologic:  Face is symmetric. Moving all extremities. No gross focal neurologic deficits . Skin: Skin is warm, dry.  No rash or ulcers Psychiatric: Mood and affect are appropriate     Past Medical History:  Diagnosis Date   Arthritis    Cardiomyopathy ([place])    a. 11/2021 Echo: EF 20-25%, glob HK. GrIII DD. Nl RV size/fxn. Mildly elev PASP. Sev dil LA, mildly dil RA. Mild to mod MR.   Gout    Hypertension    NSVT (nonsustained ventricular tachycardia) 11/2021   PSVT (paroxysmal supraventricular tachycardia) ([place]) 11/2021    History reviewed. No pertinent surgical history.   reports that he has never smoked. He has never used smokeless tobacco. He reports that he does not drink alcohol and does not use drugs.  No Known Allergies  History reviewed. No pertinent family history.    Prior to Admission medications   Medication Sig Start Date End Date Taking? Authorizing Provider  amoxicillin-clavulanate (AUGMENTIN) 875-125 MG tablet Take 1 tablet by mouth every 12 (twelve) hours for 8 days. 12/19/21 12/27/21  Barb Merino, MD  carvedilol (COREG) 6.25  MG tablet Take 1 tablet (6.25 mg total) by mouth 2 (two) times daily with a meal. 12/19/21 03/19/22  Barb Merino, MD  losartan (COZAAR) 25 MG tablet Take 0.5 tablets (12.5 mg total) by mouth daily. 12/20/21 03/20/22  Barb Merino, MD  spironolactone (ALDACTONE) 25 MG tablet Take 0.5 tablets (12.5 mg total) by mouth daily. 12/20/21 03/20/22  Barb Merino, MD      Labs on Admission: I have personally reviewed following labs and imaging studies  CBC: Recent Labs  Lab 12/25/21 2244  WBC 15.3*  HGB 9.9*  HCT 29.2*  MCV 74.7*  PLT 123456*   Basic Metabolic Panel: Recent Labs  Lab 12/25/21 2244  NA 132*  K 4.1  CL 97*  CO2 23  GLUCOSE 123*  BUN 18  CREATININE 2.40*  CALCIUM 8.3*   GFR: Estimated Creatinine Clearance: 35.7 mL/min (A) (by C-G formula based on SCr of 2.4 mg/dL (H)). Liver Function Tests: No results for input(s): AST, ALT, ALKPHOS, BILITOT, PROT, ALBUMIN in the last 168 hours. No results for input(s): LIPASE, AMYLASE in the last 168 hours. No results for input(s): AMMONIA in the last 168 hours. Coagulation Profile: No  results for input(s): INR, PROTIME in the last 168 hours. Cardiac Enzymes: No results for input(s): CKTOTAL, CKMB, CKMBINDEX, TROPONINI in the last 168 hours. BNP (last 3 results) No results for input(s): PROBNP in the last 8760 hours. HbA1C: No results for input(s): HGBA1C in the last 72 hours. CBG: No results for input(s): GLUCAP in the last 168 hours. Lipid Profile: No results for input(s): CHOL, HDL, LDLCALC, TRIG, CHOLHDL, LDLDIRECT in the last 72 hours. Thyroid Function Tests: No results for input(s): TSH, T4TOTAL, FREET4, T3FREE, THYROIDAB in the last 72 hours. Anemia Panel: No results for input(s): VITAMINB12, FOLATE, FERRITIN, TIBC, IRON, RETICCTPCT in the last 72 hours. Urine analysis:    Component Value Date/Time   COLORURINE YELLOW (A) 12/14/2021 0749   APPEARANCEUR HAZY (A) 12/14/2021 0749   LABSPEC 1.029 12/14/2021 0749    PHURINE 5.0 12/14/2021 0749   GLUCOSEU NEGATIVE 12/14/2021 0749   HGBUR MODERATE (A) 12/14/2021 0749   BILIRUBINUR NEGATIVE 12/14/2021 0749   KETONESUR NEGATIVE 12/14/2021 0749   PROTEINUR 30 (A) 12/14/2021 0749   NITRITE NEGATIVE 12/14/2021 0749   LEUKOCYTESUR NEGATIVE 12/14/2021 0749    Radiological Exams on Admission: DG Chest Portable 1 View  Result Date: 12/26/2021 CLINICAL DATA:  Bilateral ankle and left wrist pain with gout, arthritis and an abnormal EKG. EXAM: PORTABLE CHEST 1 VIEW COMPARISON:  December 14, 2021 FINDINGS: The heart size and mediastinal contours are within normal limits. Both lungs are clear. The visualized skeletal structures are unremarkable. IMPRESSION: No active disease. Electronically Signed   By: Aram Candela M.D.   On: 12/26/2021 00:04       Andris Baumann MD [facility]   12/26/2021, 2:19 AM

## 2021-12-26 NOTE — ED Notes (Signed)
Pt resting in bed. NAD noted. No needs expressed at this time

## 2021-12-26 NOTE — Progress Notes (Signed)
ANTICOAGULATION CONSULT NOTE  Pharmacy Consult for heparin infusion Indication: Portal Vein Thrombosis  No Known Allergies  Patient Measurements: Height: 5\' 2"  (157.5 cm) Weight: 111.1 kg (245 lb) IBW/kg (Calculated) : 54.6 Heparin Dosing Weight: 81.1 kg  Vital Signs: BP: 124/78 (12/28 1000) Pulse Rate: 77 (12/28 1000)  Labs: Recent Labs    12/25/21 2244 12/26/21 0808 12/26/21 0933  HGB 9.9* 10.2*  --   HCT 29.2* 30.2*  --   PLT 416* 431*  --   APTT  --   --  69*  LABPROT  --   --  16.9*  INR  --   --  1.4*  HEPARINUNFRC  --   --  0.32  CREATININE 2.40* 1.89*  --   TROPONINIHS 15 13  --      Estimated Creatinine Clearance: 45.4 mL/min (A) (by C-G formula based on SCr of 1.89 mg/dL (H)).   Medical History: Past Medical History:  Diagnosis Date   Arthritis    Cardiomyopathy (HCC)    a. 11/2021 Echo: EF 20-25%, glob HK. GrIII DD. Nl RV size/fxn. Mildly elev PASP. Sev dil LA, mildly dil RA. Mild to mod MR.   Gout    Hypertension    NSVT (nonsustained ventricular tachycardia) 11/2021   PSVT (paroxysmal supraventricular tachycardia) (HCC) 11/2021    Assessment: Pt is 60 yo male presenting to ED c/o arthritis & gout pain, found with portal vein thrombosis. Pharmacy has been consulted for heparin dosing/monitoring.   12/28 0933 HL 0.32, therapeutic   Goal of Therapy:  Heparin level 0.3-0.7 units/ml Monitor platelets by anticoagulation protocol: Yes   Plan:  HL therapeutic. Continue heparin infusion at 1350 unit/hr Check HL in 6 hr for confirmation CBC daily while on heparin  1/29, PharmD Clinical Pharmacist  12/26/2021 11:11 AM

## 2021-12-26 NOTE — Progress Notes (Signed)
No charge progress note.  Steven Pham is a 59 y.o. male with medical history significant for Gout, HTN, combined CHF recently diagnosed with EF 20 to 25% and grade 3 DD, pending outpatient ischemic work-up, recently hospitalized from 12/16-12/21 with sepsis secondary to acute diverticulitis and acute kidney injury, who returns to the ED with a complaint of gout flareup.  During his evaluation in the ED he was found to be hypotensive with BP 76/48 and otherwise normal vitals.  He otherwise denied chest pain, shortness of breath, lightheadedness, cough fever chills, nausea, vomiting, abdominal pain and diarrhea.  CT abdomen and pelvis showed findings concerning for thrombus extending from the main portal vein into the left portal vein with recommendation for repeat CT with IV contrast.  Improved inflammatory changes of small bowel.  He was started on steroid and pain control for gout flare. Vascular surgery was also consulted for portal vein thrombosis and they are recommending no intervention, on the anticoagulation. He was currently on heparin infusion.  He was started on multiple new medications which include spironolactone, Lasix, carvedilol and losartan due to his HFrEF. His home meds were held and he received some IV fluid.  Blood pressure improved, now normotensive.  Also found to have AKI which started improving now.  Creatinine at 1.89 this morning, baseline around 1-1.2.  Most likely with continuation of diuretics and losartan along with hypotension.  -Switch heparin with NOAC -Stop IV fluid as he has EF of 20 to 25%-currently appears euvolemic -Continue holding home diuretics and losartan. -He needs a close outpatient cardiology appointment and they can restart medications as appropriate

## 2021-12-26 NOTE — Progress Notes (Signed)
ANTICOAGULATION CONSULT NOTE  Pharmacy Consult for heparin infusion Indication: Portal Vein Thrombosis  No Known Allergies  Patient Measurements: Height: 5\' 2"  (157.5 cm) Weight: 111.1 kg (245 lb) IBW/kg (Calculated) : 54.6 Heparin Dosing Weight: 81.1 kg  Vital Signs: Temp: 99.8 F (37.7 C) (12/27 2125) Temp Source: Oral (12/27 2125) BP: 100/70 (12/28 0036) Pulse Rate: 93 (12/28 0036)  Labs: Recent Labs    12/25/21 2244  HGB 9.9*  HCT 29.2*  PLT 416*  CREATININE 2.40*  TROPONINIHS 15    Estimated Creatinine Clearance: 35.7 mL/min (A) (by C-G formula based on SCr of 2.4 mg/dL (H)).   Medical History: Past Medical History:  Diagnosis Date   Arthritis    Cardiomyopathy (HCC)    a. 11/2021 Echo: EF 20-25%, glob HK. GrIII DD. Nl RV size/fxn. Mildly elev PASP. Sev dil LA, mildly dil RA. Mild to mod MR.   Gout    Hypertension    NSVT (nonsustained ventricular tachycardia) 11/2021   PSVT (paroxysmal supraventricular tachycardia) (HCC) 11/2021    Assessment: Pt is 60 yo male presenting to ED c/o arthritis & gout pain, found with portal vein thrombosis.  Goal of Therapy:  Heparin level 0.3-0.7 units/ml Monitor platelets by anticoagulation protocol: Yes   Plan:  Bolus 5500 units x 1 Start heparin infusion at 1350 unit/hr Check HL in 6 hr after start of infusion CBC daily while on heparin  67, PharmD, [facility] 12/26/2021 1:23 AM

## 2021-12-27 DIAGNOSIS — I81 Portal vein thrombosis: Secondary | ICD-10-CM | POA: Diagnosis not present

## 2021-12-27 LAB — CBC
HCT: 31 % — ABNORMAL LOW (ref 39.0–52.0)
Hemoglobin: 10.5 g/dL — ABNORMAL LOW (ref 13.0–17.0)
MCH: 25.3 pg — ABNORMAL LOW (ref 26.0–34.0)
MCHC: 33.9 g/dL (ref 30.0–36.0)
MCV: 74.7 fL — ABNORMAL LOW (ref 80.0–100.0)
Platelets: 462 10*3/uL — ABNORMAL HIGH (ref 150–400)
RBC: 4.15 MIL/uL — ABNORMAL LOW (ref 4.22–5.81)
RDW: 15.9 % — ABNORMAL HIGH (ref 11.5–15.5)
WBC: 6.9 10*3/uL (ref 4.0–10.5)
nRBC: 0 % (ref 0.0–0.2)

## 2021-12-27 LAB — BASIC METABOLIC PANEL
Anion gap: 10 (ref 5–15)
BUN: 26 mg/dL — ABNORMAL HIGH (ref 6–20)
CO2: 24 mmol/L (ref 22–32)
Calcium: 8.7 mg/dL — ABNORMAL LOW (ref 8.9–10.3)
Chloride: 102 mmol/L (ref 98–111)
Creatinine, Ser: 1.5 mg/dL — ABNORMAL HIGH (ref 0.61–1.24)
GFR, Estimated: 53 mL/min — ABNORMAL LOW (ref >60)
Glucose, Bld: 111 mg/dL — ABNORMAL HIGH (ref 70–99)
Potassium: 4.2 mmol/L (ref 3.5–5.1)
Sodium: 136 mmol/L (ref 135–145)

## 2021-12-27 MED ORDER — LOSARTAN POTASSIUM 25 MG PO TABS: 12.5000 mg | ORAL_TABLET | Freq: Every day | ORAL | 2 refills | Status: DC

## 2021-12-27 MED ORDER — AMLODIPINE BESYLATE 5 MG PO TABS: 5.0000 mg | ORAL_TABLET | Freq: Every day | ORAL | Status: DC

## 2021-12-27 MED ORDER — APIXABAN (ELIQUIS) VTE STARTER PACK (10MG AND 5MG): ORAL_TABLET | ORAL | 0 refills | Status: DC

## 2021-12-27 MED ORDER — PREDNISONE 20 MG PO TABS
40.0000 mg | ORAL_TABLET | Freq: Every day | ORAL | 0 refills | Status: AC
Start: 2021-12-28 — End: 2022-01-02

## 2021-12-27 MED ORDER — COLCHICINE 0.6 MG PO TABS: 0.6000 mg | ORAL_TABLET | Freq: Two times a day (BID) | ORAL | 0 refills | Status: DC

## 2021-12-27 NOTE — Progress Notes (Signed)
[facility]  Daily Progress Note   Subjective  -   Feeling well, no abdominal symptoms. No events overnight. To be discharged today  Objective Vitals:   12/26/21 2300 12/27/21 0323 12/27/21 0758 12/27/21 1147  BP: 107/63 110/73 122/74 110/61  Pulse: 80 70 87 (!) 107  Resp: 15 15 18 18   Temp: 98.1 F (36.7 C) 98.7 F (37.1 C) 98.1 F (36.7 C) 98.6 F (37 C)  TempSrc:   Oral   SpO2: 95% 97% 99% 95%  Weight:      Height:        Intake/Output Summary (Last 24 hours) at 12/27/2021 1237 Last data filed at 12/27/2021 0950 Gross per 24 hour  Intake 353.45 ml  Output 1000 ml  Net -646.55 ml    PULM  CTAB CV  RRR VASC  Abdomen soft  Laboratory CBC    Component Value Date/Time   WBC 6.9 12/27/2021 0611   HGB 10.5 (L) 12/27/2021 0611   HCT 31.0 (L) 12/27/2021 0611   PLT 462 (H) 12/27/2021 0611    BMET    Component Value Date/Time   NA 136 12/27/2021 0611   K 4.2 12/27/2021 0611   CL 102 12/27/2021 0611   CO2 24 12/27/2021 0611   GLUCOSE 111 (H) 12/27/2021 0611   BUN 26 (H) 12/27/2021 0611   CREATININE 1.50 (H) 12/27/2021 0611   CALCIUM 8.7 (L) 12/27/2021 0611   GFRNONAA 53 (L) 12/27/2021 0611   GFRAA >60 10/30/2019 1051    Assessment/Planning:   Portal vein thrombosis WBC normalized Doing well OK to d/c from vascular POV Follow up in office in 3-4 weeks   11/01/2019  12/27/2021, 12:37 PM

## 2021-12-27 NOTE — Consult Note (Signed)
Counseled patient on apixaban and gave 30-day free coupon and 10 dollar co-pay coupon. Pt verbalized understanding of the directions.  Thanks,  Paschal Dopp, PharmD,

## 2021-12-27 NOTE — Progress Notes (Signed)
Patient discharged home with all belongings packed and taken to care by volunteers. Ivs removed. Discharge instructions reviewed and all questions answered at this time. Patient to be transported home via [facility].

## 2021-12-27 NOTE — Discharge Summary (Signed)
Physician Discharge Summary  Steven Pham [id number] DOB: 10-25-1961 DOA: 12/25/2021  PCP: Lianne Bushy, MD  Admit date: 12/25/2021 Discharge date: 12/27/2021  Admitted From: Home Disposition: Home  Recommendations for Outpatient Follow-up:  Follow up with PCP in 1-2 weeks Follow-up with cardiology in 1 week Follow-up with vascular surgery Please obtain BMP/CBC in one week Please follow up on the following pending results: None  Home Health: No Equipment/Devices: Rolling walker Discharge Condition: Stable CODE STATUS: Full Diet recommendation: Heart Healthy   Brief/Interim Summary: Steven Pham is a 60 y.o. male with medical history significant for Gout, HTN, combined CHF recently diagnosed with EF 20 to 25% and grade 3 DD, pending outpatient ischemic work-up, recently hospitalized from 12/16-12/21 with sepsis secondary to acute diverticulitis and acute kidney injury, who returns to the ED with a complaint of gout flareup.  During his evaluation in the ED he was found to be hypotensive with BP 76/48 and otherwise normal vitals.  He otherwise denied chest pain, shortness of breath, lightheadedness, cough fever chills, nausea, vomiting, abdominal pain and diarrhea.   CT abdomen and pelvis showed findings concerning for thrombus extending from the main portal vein into the left portal vein with recommendation for repeat CT with IV contrast.  Improved inflammatory changes of small bowel.   He was started on steroid and pain control for gout flare. Vascular surgery was also consulted for portal vein thrombosis and they are recommending no intervention, and only anticoagulation.  He initially received heparin infusion and then later transitioned to Eliquis.  He will follow-up with vascular surgery as an outpatient for further recommendations.  Because of his softer blood pressure and AKI we held his diuretics and antihypertensives.  He was recently started on multiple new medications  which include Lasix, spironolactone, carvedilol and losartan for his new diagnosis of HFrEF.  He can resume home dose of carvedilol and spironolactone on discharge and should continue to hold amlodipine, Lasix and losartan.  Renal functions improving. He will need a close follow-up with cardiology to restart those medications when appropriate. Patient did receive some IV fluid and her blood pressure responded well.  Currently normotensive.  Patient was also found to have AKI which started improving after getting some IV fluid and holding diuretics.  Patient will need a repeat renal function testing in few days which can be done at PCP office.  He can continue rest of his home medications except mentioned above and follow-up with his providers.  Discharge Diagnoses:  Principal Problem:   Portal vein thrombosis Active Problems:   AKI (acute kidney injury) ([facility])   Essential hypertension   Gout flare   Hypotension   Chronic combined systolic and diastolic CHF (congestive heart failure) (HCC)   Leukocytosis   Discharge Instructions  Discharge Instructions     Diet - low sodium heart healthy   Complete by: As directed    Discharge instructions   Complete by: As directed    It was pleasure taking care of you. You are being started on a new medicine for your clots and portal vein, you will take 2 pills twice a day for the next 6 days and then start taking 1 pill twice a day.  Your doctor can provide you refills later on Hold your amlodipine and losartan until you see your cardiologist as they were lowering your blood pressure more than what is desired. You can continue taking carvedilol and spironolactone. Please follow-up with your cardiologist as soon as possible for further recommendations  Increase activity slowly   Complete by: As directed       Allergies as of 12/27/2021   No Known Allergies      Medication List     STOP taking these medications     amoxicillin-clavulanate 875-125 MG tablet Commonly known as: AUGMENTIN       TAKE these medications    amLODipine 5 MG tablet Commonly known as: NORVASC Take 1 tablet (5 mg total) by mouth daily. Hold until you see your cardiologist What changed: additional instructions   Apixaban Starter Pack (10mg  and 5mg ) Commonly known as: ELIQUIS STARTER PACK Take as directed on package: start with two-5mg  tablets twice daily for 7 days. On day 8, switch to one-5mg  tablet twice daily.   carvedilol 6.25 MG tablet Commonly known as: COREG Take 1 tablet (6.25 mg total) by mouth 2 (two) times daily with a meal.   colchicine 0.6 MG tablet Take 1 tablet (0.6 mg total) by mouth 2 (two) times daily.   losartan 25 MG tablet Commonly known as: COZAAR Take 0.5 tablets (12.5 mg total) by mouth daily. Hold until you see your cardiologist What changed: additional instructions   predniSONE 20 MG tablet Commonly known as: DELTASONE Take 2 tablets (40 mg total) by mouth daily with breakfast for 5 days. Start taking on: December 28, 2021   spironolactone 25 MG tablet Commonly known as: ALDACTONE Take 0.5 tablets (12.5 mg total) by mouth daily.               Durable Medical Equipment  (From admission, onward)           Start     Ordered   12/27/21 1224  For home use only DME Walker rolling  Once       Question Answer Comment  Walker: With 5 Inch Wheels   Patient needs a walker to treat with the following condition Gout flare      12/27/21 1223            Follow-up Information     Kris Hartmann, NP Follow up in 4 week(s).   Specialty: Vascular Surgery Contact information: [place] [address] [phone]         Lianne Bushy, MD. Schedule an appointment as soon as possible for a visit in 1 week(s).   Specialty: Family Medicine Contact information: [place] [address] [phone]         Wellington Hampshire, MD.  Schedule an appointment as soon as possible for a visit in 1 week(s).   Specialty: Cardiology Contact information: [place] [address] [phone]                No Known Allergies  Consultations: Vascular surgery  Procedures/Studies: CT ABDOMEN PELVIS WO CONTRAST  Result Date: 12/26/2021 CLINICAL DATA:  Abdominal pain.  Concern for acute diverticulitis. EXAM: CT ABDOMEN AND PELVIS WITHOUT CONTRAST TECHNIQUE: Multidetector CT imaging of the abdomen and pelvis was performed following the standard protocol without IV contrast. COMPARISON:  CT abdomen pelvis dated 12/14/2021. FINDINGS: Evaluation of this exam is limited in the absence of intravenous contrast. Lower chest: Minimal bibasilar atelectasis. The visualized lung bases are otherwise clear. No intra-abdominal free air or free fluid. Hepatobiliary: The liver is unremarkable. No intrahepatic dilatation. The gallbladder is contracted. No calcified gallstone. Pancreas: The pancreas is mildly atrophic. Evaluation of the pancreas is limited in the absence of intravenous contrast. Spleen: Normal in  size without focal abnormality. Adrenals/Urinary Tract: The adrenal glands are unremarkable. There is no hydronephrosis or nephrolithiasis on either side. There is a 5 cm left renal inferior pole cyst and a smaller hypodensity which is not characterized. The visualized ureters and urinary bladder appear unremarkable. Stomach/Bowel: There is distal colonic diverticulosis without active inflammatory changes. Inflammatory changes of the small bowel mesentery as seen on the prior CT and corresponding to the small-bowel diverticulitis. There is no bowel obstruction. The appendix is normal. Vascular/Lymphatic: Mild aortoiliac atherosclerotic disease. The IVC is unremarkable. There is again air within the SMV and main portal vein. There is high attenuating content in the left portal vein most concerning for portal vein  thrombosis. Evaluation is very limited, almost nondiagnostic on a noncontrast CT. Repeat CT with IV contrast in portal venous phase or interrogation of the portal vein with duplex ultrasound may provide better evaluation. Overall there is increased SMV and portal venous gas compared to prior CT. Reproductive: The prostate and seminal vesicles are grossly unremarkable. No pelvic mass. Other: Small fat containing umbilical hernia Musculoskeletal: With degenerative changes of the spine. No acute osseous pathology. IMPRESSION: 1. Increase in the amount of gas within the SMV and main portal vein with findings concerning for thrombus extending from the main portal vein into the left portal vein. Repeat CT with IV contrast in portal venous phase or interrogation of the portal vein with duplex ultrasound may provide better evaluation. 2. Improved inflammatory changes of the small bowel mesentery since the prior CT. No drainable fluid collection or abscess. 3. Colonic diverticulosis. No bowel obstruction. Normal appendix. 4. Aortic Atherosclerosis (ICD10-I70.0). These results were called by telephone at the time of interpretation on 12/26/2021 at 12:47 am to Dr. Karma Greaser, who verbally acknowledged these results. Electronically Signed   By: Anner Crete M.D.   On: 12/26/2021 00:53   DG Chest 1 View  Result Date: 12/14/2021 CLINICAL DATA:  Upper abdominal pain and vomiting. EXAM: CHEST  1 VIEW COMPARISON:  May 13, 2021 FINDINGS: Very mild, stable atelectasis is seen within the bilateral lung bases. There is no evidence of a pleural effusion or pneumothorax. The heart size and mediastinal contours are within normal limits. The visualized skeletal structures are unremarkable. IMPRESSION: Very mild bibasilar atelectasis. Electronically Signed   By: Virgina Norfolk M.D.   On: 12/14/2021 20:34   CT ABDOMEN PELVIS W CONTRAST  Result Date: 12/14/2021 CLINICAL DATA:  Upper abdominal pain for 2 days EXAM: CT ABDOMEN AND  PELVIS WITH CONTRAST TECHNIQUE: Multidetector CT imaging of the abdomen and pelvis was performed using the standard protocol following bolus administration of intravenous contrast. CONTRAST:  57mL OMNIPAQUE IOHEXOL 300 MG/ML  SOLN COMPARISON:  None. FINDINGS: Lower chest: No acute abnormality. Hepatobiliary: No focal liver abnormality is seen. No gallstones, gallbladder wall thickening, or biliary dilatation. Pancreas: Unremarkable. No pancreatic ductal dilatation or surrounding inflammatory changes. Spleen: Normal in size without focal abnormality. Adrenals/Urinary Tract: Adrenal glands are within normal limits. Kidneys demonstrate a normal enhancement pattern bilaterally. Large simple cyst is noted arising from the lower pole of the left kidney measuring 5 cm in greatest dimension. No obstructive changes are seen. The ureters are within normal limits. The bladder is partially distended. Stomach/Bowel: No obstructive or inflammatory changes of colon are seen. The appendix is within normal limits. Stomach is within normal limits. There are multiple fluid-filled outpouchings in the mid jejunum consistent with small bowel diverticular change. Some associated inflammatory changes are noted in the adjacent mesentery with findings  suggestive of early venous air related to the inflammatory change. No definitive portal venous air is noted. Vascular/Lymphatic: Atherosclerotic calcifications are seen. Some inflammatory changes are noted in the root of the small bowel mesentery in the left mid abdomen as described above. Some linear areas of air are noted in the inflamed mesentery and the possibility of early SMV air could not be totally excluded. Reproductive: Prostate is unremarkable. Other: Small fat containing umbilical hernia is noted. No abdominopelvic ascites. Musculoskeletal: No acute or significant osseous findings. IMPRESSION: Changes consistent with small bowel diverticulitis with inflammatory changes in the  mesentery in the left mid abdomen as well as some very early venous air adjacent to the small bowel loops. Left renal cyst. Critical Value/emergent results were called by telephone at the time of interpretation on 12/14/2021 at 10:34 pm to Dr. Dorothea Glassman , who verbally acknowledged these results. Electronically Signed   By: Alcide Clever M.D.   On: 12/14/2021 22:38   DG Chest Portable 1 View  Result Date: 12/26/2021 CLINICAL DATA:  Bilateral ankle and left wrist pain with gout, arthritis and an abnormal EKG. EXAM: PORTABLE CHEST 1 VIEW COMPARISON:  December 14, 2021 FINDINGS: The heart size and mediastinal contours are within normal limits. Both lungs are clear. The visualized skeletal structures are unremarkable. IMPRESSION: No active disease. Electronically Signed   By: Aram Candela M.D.   On: 12/26/2021 00:04   ECHOCARDIOGRAM COMPLETE  Result Date: 12/18/2021    ECHOCARDIOGRAM REPORT   Patient Name:   Steven Pham Date of Exam: 12/18/2021 Medical Rec #:  [id number]    Height:       62.0 in Accession #:    [id number]   Weight:       256.0 lb Date of Birth:  [birth year]    BSA:          2.123 m Patient Age:    60 years     BP:           114/86 mmHg Patient Gender: M            HR:           82 bpm. Exam Location:  [facility] Procedure: 2D Echo, Cardiac Doppler and Color Doppler Indications:     Abnormal ECG R 94.31  History:         Patient has no prior history of Echocardiogram examinations.                  Risk Factors:Hypertension.  Sonographer:     Cristela Blue Referring Phys:  [id number] Dorcas Carrow Diagnosing Phys: Lorine Bears MD  Sonographer Comments: Suboptimal apical window. IMPRESSIONS  1. Left ventricular ejection fraction, by estimation, is 20 to 25%. The left ventricle has severely decreased function. The left ventricle demonstrates global hypokinesis. The left ventricular internal cavity size was mildly dilated. There is mild left ventricular hypertrophy. Left ventricular diastolic parameters  are consistent with Grade III diastolic dysfunction (restrictive).  2. Right ventricular systolic function is normal. The right ventricular size is normal. There is mildly elevated pulmonary artery systolic pressure.  3. Left atrial size was severely dilated.  4. Right atrial size was mildly dilated.  5. The mitral valve is normal in structure. Mild to moderate mitral valve regurgitation. No evidence of mitral stenosis.  6. The aortic valve is normal in structure. Aortic valve regurgitation is not visualized. No aortic stenosis is present.  7. The inferior vena cava is normal in size with greater than 50%  respiratory variability, suggesting right atrial pressure of 3 mmHg. FINDINGS  Left Ventricle: Left ventricular ejection fraction, by estimation, is 20 to 25%. The left ventricle has severely decreased function. The left ventricle demonstrates global hypokinesis. The left ventricular internal cavity size was mildly dilated. There is mild left ventricular hypertrophy. Left ventricular diastolic parameters are consistent with Grade III diastolic dysfunction (restrictive). Right Ventricle: The right ventricular size is normal. No increase in right ventricular wall thickness. Right ventricular systolic function is normal. There is mildly elevated pulmonary artery systolic pressure. The tricuspid regurgitant velocity is 3.22  m/s, and with an assumed right atrial pressure of 3 mmHg, the estimated right ventricular systolic pressure is [id number]. Left Atrium: Left atrial size was severely dilated. Right Atrium: Right atrial size was mildly dilated. Pericardium: There is no evidence of pericardial effusion. Mitral Valve: The mitral valve is normal in structure. Mild to moderate mitral valve regurgitation. No evidence of mitral valve stenosis. Tricuspid Valve: The tricuspid valve is normal in structure. Tricuspid valve regurgitation is mild . No evidence of tricuspid stenosis. Aortic Valve: The aortic valve is normal in  structure. Aortic valve regurgitation is not visualized. No aortic stenosis is present. Aortic valve mean gradient measures 3.0 mmHg. Aortic valve peak gradient measures 4.0 mmHg. Aortic valve area, by VTI measures 2.50 cm. Pulmonic Valve: The pulmonic valve was normal in structure. Pulmonic valve regurgitation is not visualized. No evidence of pulmonic stenosis. Aorta: The aortic root is normal in size and structure. Venous: The inferior vena cava is normal in size with greater than 50% respiratory variability, suggesting right atrial pressure of 3 mmHg. IAS/Shunts: No atrial level shunt detected by color flow Doppler.  LEFT VENTRICLE PLAX 2D LVIDd:         5.00 cm      Diastology LVIDs:         4.60 cm      LV e' medial:    4.35 cm/s LV PW:         1.50 cm      LV E/e' medial:  22.4 LV IVS:        1.10 cm      LV e' lateral:   6.53 cm/s LVOT diam:     2.10 cm      LV E/e' lateral: 14.9 LV SV:         44 LV SV Index:   21 LVOT Area:     3.46 cm  LV Volumes (MOD) LV vol d, MOD A2C: 190.0 ml LV vol d, MOD A4C: 213.0 ml LV vol s, MOD A2C: 144.0 ml LV vol s, MOD A4C: 165.0 ml LV SV MOD A2C:     46.0 ml LV SV MOD A4C:     213.0 ml LV SV MOD BP:      46.4 ml RIGHT VENTRICLE RV Basal diam:  4.30 cm TAPSE (M-mode): 4.2 cm LEFT ATRIUM              Index        RIGHT ATRIUM           Index LA diam:        5.10 cm  2.40 cm/m   RA Area:     23.90 cm LA Vol (A2C):   91.9 ml  43.28 ml/m  RA Volume:   67.40 ml  31.75 ml/m LA Vol (A4C):   146.0 ml 68.77 ml/m LA Biplane Vol: 116.0 ml 54.64 ml/m  AORTIC VALVE  PULMONIC VALVE AV Area (Vmax):    2.17 cm     PV Vmax:        0.68 m/s AV Area (Vmean):   2.27 cm     PV Vmean:       42.100 cm/s AV Area (VTI):     2.50 cm     PV VTI:         0.088 m AV Vmax:           100.00 cm/s  PV Peak grad:   1.8 mmHg AV Vmean:          76.200 cm/s  PV Mean grad:   1.0 mmHg AV VTI:            0.176 m      RVOT Peak grad: 4 mmHg AV Peak Grad:      4.0 mmHg AV Mean Grad:       3.0 mmHg LVOT Vmax:         62.70 cm/s LVOT Vmean:        50.000 cm/s LVOT VTI:          0.127 m LVOT/AV VTI ratio: 0.72  AORTA Ao Root diam: 3.80 cm MITRAL VALVE               TRICUSPID VALVE MV Area (PHT): 5.75 cm    TR Peak grad:   41.5 mmHg MV Decel Time: 132 msec    TR Vmax:        322.00 cm/s MV E velocity: 97.30 cm/s MV A velocity: 35.60 cm/s  SHUNTS MV E/A ratio:  2.73        Systemic VTI:  0.13 m                            Systemic Diam: 2.10 cm                            Pulmonic VTI:  0.116 m Kathlyn Sacramento MD Electronically signed by Kathlyn Sacramento MD Signature Date/Time: 12/18/2021/11:51:30 AM    Final    US LIVER DOPPLER  Result Date: 12/26/2021 CLINICAL DATA:  60 year old male with concern for portal thrombus. EXAM: DUPLEX ULTRASOUND OF LIVER TECHNIQUE: Color and duplex Doppler ultrasound was performed to evaluate the hepatic in-flow and out-flow vessels. COMPARISON:  CT abdomen pelvis from earlier the same day FINDINGS: Liver: Normal parenchymal echogenicity. Normal hepatic contour without nodularity. No focal lesion, mass or intrahepatic biliary ductal dilatation. Main Portal Vein size: 1.9 cm Heterogeneously hypoechoic nonocclusive thrombus is visualized throughout the main portal vein extending into the left portal vein where it is occlusive and mildly expansile. There is shadowing within the main portal vein compatible with portal venous gas visualized on comparison CT. Portal Vein Velocities Main Prox:  20 cm/sec, antegrade Main Mid: 19 cm/sec, antegrade Main Dist: Not measured. Right: 45 cm/sec, antegrade Left: 0 cm/sec Hepatic Vein Velocities Right:  26 cm/sec Middle:  20 cm/sec Left:  30 cm/sec IVC: Present and patent with normal respiratory phasicity. Hepatic Artery Velocity:  154 cm/sec Splenic Vein Velocity:  8 cm/sec Spleen: 11.9 cm x 5.0 cm x 13.1 cm with a total volume of 409 cm^3 (411 cm^3 is upper limit normal) Portal Vein Occlusion/Thrombus: Yes Splenic Vein Occlusion/Thrombus:  No Ascites: None Varices: None IMPRESSION: Acute to subacute portal venous thrombus extending from the portal confluence through the main portal vein and into the  left portal vein. The thrombus is occlusive in the left portal vein, nonocclusive in the main portal vein. Gas is visualized in the main portal vein. Ruthann Cancer, MD [facility] Electronically Signed   By: Ruthann Cancer M.D.   On: 12/26/2021 07:57    Subjective: Patient seen and examined today.  Denies any abdominal pain.  Continues to have some right foot pain although it is improving.  He was asking for a walker to help while he is recovering from his gout flare.  Discharge Exam: Vitals:   12/27/21 0758 12/27/21 1147  BP: 122/74 110/61  Pulse: 87 (!) 107  Resp: 18 18  Temp: 98.1 F (36.7 C) 98.6 F (37 C)  SpO2: 99% 95%   Vitals:   12/26/21 2300 12/27/21 0323 12/27/21 0758 12/27/21 1147  BP: 107/63 110/73 122/74 110/61  Pulse: 80 70 87 (!) 107  Resp: 15 15 18 18   Temp: 98.1 F (36.7 C) 98.7 F (37.1 C) 98.1 F (36.7 C) 98.6 F (37 C)  TempSrc:   Oral   SpO2: 95% 97% 99% 95%  Weight:      Height:        General: Pt is alert, awake, not in acute distress Cardiovascular: RRR, S1/S2 +, no rubs, no gallops Respiratory: CTA bilaterally, no wheezing, no rhonchi Abdominal: Soft, NT, ND, bowel sounds + Extremities: no edema, no cyanosis   The results of significant diagnostics from this hospitalization (including imaging, microbiology, ancillary and laboratory) are listed below for reference.    Microbiology: Recent Results (from the past 240 hour(s))  Blood culture (routine x 2)     Status: None (Preliminary result)   Collection Time: 12/25/21 10:44 PM   Specimen: BLOOD  Result Value Ref Range Status   Specimen Description BLOOD LEFT WRIST  Final   Special Requests   Final    BOTTLES DRAWN AEROBIC AND ANAEROBIC Blood Culture adequate volume   Culture    Final    NO GROWTH 2 DAYS Performed at [facility], [address], [place], [place] [address]    Report Status PENDING  Incomplete  Blood culture (routine x 2)     Status: None (Preliminary result)   Collection Time: 12/25/21 10:44 PM   Specimen: BLOOD  Result Value Ref Range Status   Specimen Description BLOOD RIGHT HAND  Final   Special Requests   Final    BOTTLES DRAWN AEROBIC AND ANAEROBIC Blood Culture results may not be optimal due to an inadequate volume of blood received in culture bottles   Culture   Final    NO GROWTH 2 DAYS Performed at [facility], [address] [place], [place] [address]    Report Status PENDING  Incomplete  Resp Panel by RT-PCR (Flu A&B, Covid) Nasopharyngeal Swab     Status: None   Collection Time: 12/25/21 11:50 PM   Specimen: Nasopharyngeal Swab; Nasopharyngeal(NP) swabs in vial transport medium  Result Value Ref Range Status   SARS Coronavirus 2 by RT PCR NEGATIVE NEGATIVE Final    Comment: (NOTE) SARS-CoV-2 target nucleic acids are NOT DETECTED.  The SARS-CoV-2 RNA is generally detectable in upper respiratory specimens during the acute phase of infection. The lowest concentration of SARS-CoV-2 viral copies this assay can detect is 138 copies/mL. A negative result does not preclude SARS-Cov-2 infection and should not be used as the sole basis for treatment or other patient management decisions. A negative result may occur with  improper  specimen collection/handling, submission of specimen other than nasopharyngeal swab, presence of viral mutation(s) within the areas targeted by this assay, and inadequate number of viral copies(<138 copies/mL). A negative result must be combined with clinical observations, patient history, and epidemiological information. The expected result is Negative.  Fact Sheet for Patients:  EntrepreneurPulse.com.au  Fact Sheet for Healthcare Providers:   IncredibleEmployment.be  This test is no t yet approved or cleared by the [place] FDA and  has been authorized for detection and/or diagnosis of SARS-CoV-2 by FDA under an Emergency Use Authorization (EUA). This EUA will remain  in effect (meaning this test can be used) for the duration of the COVID-19 declaration under Section 564(b)(1) of the Act, 21 U.S.C.section 360bbb-3(b)(1), unless the authorization is terminated  or revoked sooner.       Influenza A by PCR NEGATIVE NEGATIVE Final   Influenza B by PCR NEGATIVE NEGATIVE Final    Comment: (NOTE) The Xpert Xpress SARS-CoV-2/FLU/RSV plus assay is intended as an aid in the diagnosis of influenza from Nasopharyngeal swab specimens and should not be used as a sole basis for treatment. Nasal washings and aspirates are unacceptable for Xpert Xpress SARS-CoV-2/FLU/RSV testing.  Fact Sheet for Patients: EntrepreneurPulse.com.au  Fact Sheet for Healthcare Providers: IncredibleEmployment.be  This test is not yet approved or cleared by the [place] FDA and has been authorized for detection and/or diagnosis of SARS-CoV-2 by FDA under an Emergency Use Authorization (EUA). This EUA will remain in effect (meaning this test can be used) for the duration of the COVID-19 declaration under Section 564(b)(1) of the Act, 21 U.S.C. section 360bbb-3(b)(1), unless the authorization is terminated or revoked.  Performed at [facility], [address] [place], [place] [address]      Labs: BNP (last 3 results) Recent Labs    12/25/21 2244  BNP 99991111*   Basic Metabolic Panel: Recent Labs  Lab 12/25/21 2244 12/26/21 0808 12/27/21 0611  NA 132* 134* 136  K 4.1 4.0 4.2  CL 97* 102 102  CO2 23 23 24   GLUCOSE 123* 124* 111*  BUN 18 20 26*  CREATININE 2.40* 1.89* 1.50*  CALCIUM 8.3* 8.3* 8.7*   Liver Function Tests: No results for input(s): AST, ALT,  ALKPHOS, BILITOT, PROT, ALBUMIN in the last 168 hours. No results for input(s): LIPASE, AMYLASE in the last 168 hours. No results for input(s): AMMONIA in the last 168 hours. CBC: Recent Labs  Lab 12/25/21 2244 12/26/21 0808 12/27/21 0611  WBC 15.3* 11.8* 6.9  HGB 9.9* 10.2* 10.5*  HCT 29.2* 30.2* 31.0*  MCV 74.7* 74.9* 74.7*  PLT 416* 431* 462*   Cardiac Enzymes: No results for input(s): CKTOTAL, CKMB, CKMBINDEX, TROPONINI in the last 168 hours. BNP: Invalid input(s): POCBNP CBG: No results for input(s): GLUCAP in the last 168 hours. D-Dimer No results for input(s): DDIMER in the last 72 hours. Hgb A1c No results for input(s): HGBA1C in the last 72 hours. Lipid Profile No results for input(s): CHOL, HDL, LDLCALC, TRIG, CHOLHDL, LDLDIRECT in the last 72 hours. Thyroid function studies No results for input(s): TSH, T4TOTAL, T3FREE, THYROIDAB in the last 72 hours.  Invalid input(s): FREET3 Anemia work up No results for input(s): VITAMINB12, FOLATE, FERRITIN, TIBC, IRON, RETICCTPCT in the last 72 hours. Urinalysis    Component Value Date/Time   COLORURINE YELLOW (A) 12/14/2021 0749   APPEARANCEUR HAZY (A) 12/14/2021 0749   LABSPEC 1.029 12/14/2021 0749   PHURINE 5.0 12/14/2021 [place] 12/14/2021 0749  HGBUR MODERATE (A) 12/14/2021 0749   BILIRUBINUR NEGATIVE 12/14/2021 0749   KETONESUR NEGATIVE 12/14/2021 0749   PROTEINUR 30 (A) 12/14/2021 0749   NITRITE NEGATIVE 12/14/2021 0749   LEUKOCYTESUR NEGATIVE 12/14/2021 0749   Sepsis Labs Invalid input(s): PROCALCITONIN,  WBC,  LACTICIDVEN Microbiology Recent Results (from the past 240 hour(s))  Blood culture (routine x 2)     Status: None (Preliminary result)   Collection Time: 12/25/21 10:44 PM   Specimen: BLOOD  Result Value Ref Range Status   Specimen Description BLOOD LEFT WRIST  Final   Special Requests   Final    BOTTLES DRAWN AEROBIC AND ANAEROBIC Blood Culture adequate volume   Culture    Final    NO GROWTH 2 DAYS Performed at [facility], [address], [place], [place] [address]    Report Status PENDING  Incomplete  Blood culture (routine x 2)     Status: None (Preliminary result)   Collection Time: 12/25/21 10:44 PM   Specimen: BLOOD  Result Value Ref Range Status   Specimen Description BLOOD RIGHT HAND  Final   Special Requests   Final    BOTTLES DRAWN AEROBIC AND ANAEROBIC Blood Culture results may not be optimal due to an inadequate volume of blood received in culture bottles   Culture   Final    NO GROWTH 2 DAYS Performed at [facility], [address] [place], [place] [address]    Report Status PENDING  Incomplete  Resp Panel by RT-PCR (Flu A&B, Covid) Nasopharyngeal Swab     Status: None   Collection Time: 12/25/21 11:50 PM   Specimen: Nasopharyngeal Swab; Nasopharyngeal(NP) swabs in vial transport medium  Result Value Ref Range Status   SARS Coronavirus 2 by RT PCR NEGATIVE NEGATIVE Final    Comment: (NOTE) SARS-CoV-2 target nucleic acids are NOT DETECTED.  The SARS-CoV-2 RNA is generally detectable in upper respiratory specimens during the acute phase of infection. The lowest concentration of SARS-CoV-2 viral copies this assay can detect is 138 copies/mL. A negative result does not preclude SARS-Cov-2 infection and should not be used as the sole basis for treatment or other patient management decisions. A negative result may occur with  improper specimen collection/handling, submission of specimen other than nasopharyngeal swab, presence of viral mutation(s) within the areas targeted by this assay, and inadequate number of viral copies(<138 copies/mL). A negative result must be combined with clinical observations, patient history, and epidemiological information. The expected result is Negative.  Fact Sheet for Patients:  EntrepreneurPulse.com.au  Fact Sheet for Healthcare Providers:   IncredibleEmployment.be  This test is no t yet approved or cleared by the [place] FDA and  has been authorized for detection and/or diagnosis of SARS-CoV-2 by FDA under an Emergency Use Authorization (EUA). This EUA will remain  in effect (meaning this test can be used) for the duration of the COVID-19 declaration under Section 564(b)(1) of the Act, 21 U.S.C.section 360bbb-3(b)(1), unless the authorization is terminated  or revoked sooner.       Influenza A by PCR NEGATIVE NEGATIVE Final   Influenza B by PCR NEGATIVE NEGATIVE Final    Comment: (NOTE) The Xpert Xpress SARS-CoV-2/FLU/RSV plus assay is intended as an aid in the diagnosis of influenza from Nasopharyngeal swab specimens and should not be used as a sole basis for treatment. Nasal washings and aspirates are unacceptable for Xpert Xpress SARS-CoV-2/FLU/RSV testing.  Fact Sheet for Patients: EntrepreneurPulse.com.au  Fact Sheet for Healthcare Providers: IncredibleEmployment.be  This test is  not yet approved or cleared by the [place] and has been authorized for detection and/or diagnosis of SARS-CoV-2 by FDA under an Emergency Use Authorization (EUA). This EUA will remain in effect (meaning this test can be used) for the duration of the COVID-19 declaration under Section 564(b)(1) of the Act, 21 U.S.C. section 360bbb-3(b)(1), unless the authorization is terminated or revoked.  Performed at [facility], [address] [place], [place] [address]     Time coordinating discharge: Over 30 minutes  SIGNED:  Lorella Nimrod, MD  [facility] 12/27/2021, 12:45 PM  If 7PM-7AM, please contact night-coverage [email or web address]  This record has been created using Systems analyst. Errors have been sought and corrected,but may not always be located. Such creation errors do not reflect on the standard of care.

## 2021-12-30 DIAGNOSIS — I81 Portal vein thrombosis: Secondary | ICD-10-CM

## 2021-12-30 HISTORY — DX: Portal vein thrombosis: I81

## 2021-12-30 LAB — CULTURE, BLOOD (ROUTINE X 2)
Culture: NO GROWTH
Culture: NO GROWTH
Special Requests: ADEQUATE

## 2022-01-03 ENCOUNTER — Inpatient Hospital Stay
Admission: EM | Admit: 2022-01-03 | Discharge: 2022-01-14 | DRG: 308 | Disposition: A | Discharge status: Home Health | Payer: BC Managed Care – PPO | Source: Ambulatory Visit | Attending: Internal Medicine | Admitting: Internal Medicine

## 2022-01-03 ENCOUNTER — Inpatient Hospital Stay: Payer: Self-pay

## 2022-01-03 ENCOUNTER — Telehealth: Payer: Self-pay | Admitting: Cardiovascular Disease

## 2022-01-03 ENCOUNTER — Emergency Department: Payer: BC Managed Care – PPO

## 2022-01-03 ENCOUNTER — Encounter: Payer: Self-pay | Admitting: Cardiovascular Disease

## 2022-01-03 ENCOUNTER — Other Ambulatory Visit: Payer: Self-pay

## 2022-01-03 ENCOUNTER — Ambulatory Visit: Payer: BC Managed Care – PPO | Admitting: Cardiovascular Disease

## 2022-01-03 VITALS — BP 90/60 | HR 81 | Ht 63.0 in | Wt 243.1 lb

## 2022-01-03 DIAGNOSIS — Z20822 Contact with and (suspected) exposure to covid-19: Secondary | ICD-10-CM | POA: Diagnosis not present

## 2022-01-03 DIAGNOSIS — I5020 Unspecified systolic (congestive) heart failure: Secondary | ICD-10-CM

## 2022-01-03 DIAGNOSIS — R7881 Bacteremia: Secondary | ICD-10-CM

## 2022-01-03 DIAGNOSIS — Z8249 Family history of ischemic heart disease and other diseases of the circulatory system: Secondary | ICD-10-CM | POA: Diagnosis not present

## 2022-01-03 DIAGNOSIS — I10 Essential (primary) hypertension: Secondary | ICD-10-CM

## 2022-01-03 DIAGNOSIS — A409 Streptococcal sepsis, unspecified: Secondary | ICD-10-CM | POA: Diagnosis not present

## 2022-01-03 DIAGNOSIS — K5712 Diverticulitis of small intestine without perforation or abscess without bleeding: Secondary | ICD-10-CM | POA: Diagnosis not present

## 2022-01-03 DIAGNOSIS — D631 Anemia in chronic kidney disease: Secondary | ICD-10-CM | POA: Diagnosis present

## 2022-01-03 DIAGNOSIS — I4891 Unspecified atrial fibrillation: Principal | ICD-10-CM | POA: Diagnosis present

## 2022-01-03 DIAGNOSIS — I4892 Unspecified atrial flutter: Secondary | ICD-10-CM | POA: Diagnosis not present

## 2022-01-03 DIAGNOSIS — Z7901 Long term (current) use of anticoagulants: Secondary | ICD-10-CM | POA: Diagnosis not present

## 2022-01-03 DIAGNOSIS — R6510 Systemic inflammatory response syndrome (SIRS) of non-infectious origin without acute organ dysfunction: Secondary | ICD-10-CM

## 2022-01-03 DIAGNOSIS — I959 Hypotension, unspecified: Secondary | ICD-10-CM | POA: Diagnosis not present

## 2022-01-03 DIAGNOSIS — I5043 Acute on chronic combined systolic (congestive) and diastolic (congestive) heart failure: Secondary | ICD-10-CM | POA: Diagnosis not present

## 2022-01-03 DIAGNOSIS — I130 Hypertensive heart and chronic kidney disease with heart failure and stage 1 through stage 4 chronic kidney disease, or unspecified chronic kidney disease: Secondary | ICD-10-CM | POA: Diagnosis not present

## 2022-01-03 DIAGNOSIS — R079 Chest pain, unspecified: Secondary | ICD-10-CM | POA: Diagnosis not present

## 2022-01-03 DIAGNOSIS — E669 Obesity, unspecified: Secondary | ICD-10-CM | POA: Diagnosis present

## 2022-01-03 DIAGNOSIS — Z79899 Other long term (current) drug therapy: Secondary | ICD-10-CM | POA: Diagnosis not present

## 2022-01-03 DIAGNOSIS — M1000 Idiopathic gout, unspecified site: Secondary | ICD-10-CM

## 2022-01-03 DIAGNOSIS — Z86718 Personal history of other venous thrombosis and embolism: Secondary | ICD-10-CM

## 2022-01-03 DIAGNOSIS — M109 Gout, unspecified: Secondary | ICD-10-CM | POA: Diagnosis not present

## 2022-01-03 DIAGNOSIS — G4733 Obstructive sleep apnea (adult) (pediatric): Secondary | ICD-10-CM | POA: Diagnosis present

## 2022-01-03 DIAGNOSIS — I5022 Chronic systolic (congestive) heart failure: Secondary | ICD-10-CM

## 2022-01-03 DIAGNOSIS — R0602 Shortness of breath: Secondary | ICD-10-CM | POA: Diagnosis not present

## 2022-01-03 DIAGNOSIS — I5041 Acute combined systolic (congestive) and diastolic (congestive) heart failure: Secondary | ICD-10-CM | POA: Diagnosis not present

## 2022-01-03 DIAGNOSIS — N1831 Chronic kidney disease, stage 3a: Secondary | ICD-10-CM | POA: Diagnosis not present

## 2022-01-03 DIAGNOSIS — I81 Portal vein thrombosis: Secondary | ICD-10-CM | POA: Diagnosis not present

## 2022-01-03 DIAGNOSIS — B9620 Unspecified Escherichia coli [E. coli] as the cause of diseases classified elsewhere: Secondary | ICD-10-CM | POA: Diagnosis not present

## 2022-01-03 DIAGNOSIS — N179 Acute kidney failure, unspecified: Secondary | ICD-10-CM | POA: Diagnosis present

## 2022-01-03 DIAGNOSIS — N281 Cyst of kidney, acquired: Secondary | ICD-10-CM | POA: Diagnosis not present

## 2022-01-03 DIAGNOSIS — Z833 Family history of diabetes mellitus: Secondary | ICD-10-CM

## 2022-01-03 DIAGNOSIS — I429 Cardiomyopathy, unspecified: Secondary | ICD-10-CM | POA: Diagnosis present

## 2022-01-03 DIAGNOSIS — I9589 Other hypotension: Secondary | ICD-10-CM | POA: Diagnosis not present

## 2022-01-03 DIAGNOSIS — A4151 Sepsis due to Escherichia coli [E. coli]: Secondary | ICD-10-CM | POA: Diagnosis not present

## 2022-01-03 DIAGNOSIS — K5790 Diverticulosis of intestine, part unspecified, without perforation or abscess without bleeding: Secondary | ICD-10-CM | POA: Diagnosis not present

## 2022-01-03 DIAGNOSIS — Z6841 Body Mass Index (BMI) 40.0 and over, adult: Secondary | ICD-10-CM

## 2022-01-03 DIAGNOSIS — R002 Palpitations: Secondary | ICD-10-CM | POA: Diagnosis not present

## 2022-01-03 DIAGNOSIS — K751 Phlebitis of portal vein: Secondary | ICD-10-CM | POA: Diagnosis not present

## 2022-01-03 LAB — TROPONIN I (HIGH SENSITIVITY): Troponin I (High Sensitivity): 51 ng/L — ABNORMAL HIGH (ref <18)

## 2022-01-03 LAB — CBC
HCT: 34.6 % — ABNORMAL LOW (ref 39.0–52.0)
Hemoglobin: 12.1 g/dL — ABNORMAL LOW (ref 13.0–17.0)
MCH: 24.5 pg — ABNORMAL LOW (ref 26.0–34.0)
MCHC: 35 g/dL (ref 30.0–36.0)
MCV: 70 fL — ABNORMAL LOW (ref 80.0–100.0)
Platelets: 253 10*3/uL (ref 150–400)
RBC: 4.94 MIL/uL (ref 4.22–5.81)
RDW: 15.9 % — ABNORMAL HIGH (ref 11.5–15.5)
WBC: 19.7 10*3/uL — ABNORMAL HIGH (ref 4.0–10.5)
nRBC: 0.2 % (ref 0.0–0.2)

## 2022-01-03 LAB — BASIC METABOLIC PANEL
Anion gap: 10 (ref 5–15)
BUN: 37 mg/dL — ABNORMAL HIGH (ref 6–20)
CO2: 23 mmol/L (ref 22–32)
Calcium: 8.3 mg/dL — ABNORMAL LOW (ref 8.9–10.3)
Chloride: 103 mmol/L (ref 98–111)
Creatinine, Ser: 1.53 mg/dL — ABNORMAL HIGH (ref 0.61–1.24)
GFR, Estimated: 52 mL/min — ABNORMAL LOW (ref >60)
Glucose, Bld: 99 mg/dL (ref 70–99)
Potassium: 4.5 mmol/L (ref 3.5–5.1)
Sodium: 136 mmol/L (ref 135–145)

## 2022-01-03 LAB — MAGNESIUM: Magnesium: 2.5 mg/dL — ABNORMAL HIGH (ref 1.7–2.4)

## 2022-01-03 LAB — PROCALCITONIN: Procalcitonin: 55.57 ng/mL

## 2022-01-03 MED ORDER — HEPARIN (PORCINE) 25000 UT/250ML-% IV SOLN
3150.0000 [IU]/h | INTRAVENOUS | Status: DC
  Administered 2022-01-03: 21:00:00 1350 [IU]/h via INTRAVENOUS
  Administered 2022-01-04: 13:00:00 1600 [IU]/h via INTRAVENOUS
  Administered 2022-01-05: 2300 [IU]/h via INTRAVENOUS
  Administered 2022-01-06: 2450 [IU]/h via INTRAVENOUS
  Administered 2022-01-06: 2550 [IU]/h via INTRAVENOUS
  Administered 2022-01-07: 3000 [IU]/h via INTRAVENOUS
  Administered 2022-01-07: 3150 [IU]/h via INTRAVENOUS
  Filled 2022-01-03 (×10): qty 250

## 2022-01-03 MED ORDER — SODIUM CHLORIDE 0.9 % IV BOLUS
250.0000 mL | Freq: Once | INTRAVENOUS | Status: AC
  Administered 2022-01-03: 250 mL via INTRAVENOUS

## 2022-01-03 MED ORDER — AMIODARONE HCL IN DEXTROSE 360-4.14 MG/200ML-% IV SOLN
60.0000 mg/h | INTRAVENOUS | Status: DC
  Administered 2022-01-03: 60 mg/h via INTRAVENOUS
  Filled 2022-01-03: qty 200

## 2022-01-03 MED ORDER — HEPARIN BOLUS VIA INFUSION
4800.0000 [IU] | Freq: Once | INTRAVENOUS | Status: AC
  Administered 2022-01-03: 4800 [IU] via INTRAVENOUS
  Filled 2022-01-03: qty 4800

## 2022-01-03 MED ORDER — APIXABAN (ELIQUIS) VTE STARTER PACK (10MG AND 5MG): 5.0000 mg | ORAL_TABLET | Freq: Two times a day (BID) | ORAL | Status: DC

## 2022-01-03 MED ORDER — SPIRONOLACTONE 25 MG PO TABS: 12.5000 mg | ORAL_TABLET | Freq: Every day | ORAL | Status: DC

## 2022-01-03 MED ORDER — LOSARTAN POTASSIUM 25 MG PO TABS: 12.5000 mg | ORAL_TABLET | Freq: Every day | ORAL | Status: DC

## 2022-01-03 MED ORDER — TRAZODONE HCL 50 MG PO TABS
25.0000 mg | ORAL_TABLET | Freq: Every evening | ORAL | Status: DC | PRN
  Filled 2022-01-03 (×3): qty 1

## 2022-01-03 MED ORDER — MAGNESIUM HYDROXIDE 400 MG/5ML PO SUSP
30.0000 mL | Freq: Every day | ORAL | Status: DC | PRN
  Administered 2022-01-06 – 2022-01-08 (×2): 30 mL via ORAL
  Filled 2022-01-03 (×2): qty 30

## 2022-01-03 MED ORDER — ACETAMINOPHEN 325 MG PO TABS
650.0000 mg | ORAL_TABLET | ORAL | Status: DC | PRN
  Administered 2022-01-06 (×2): 650 mg via ORAL
  Filled 2022-01-03 (×2): qty 2

## 2022-01-03 MED ORDER — CARVEDILOL 6.25 MG PO TABS: 6.2500 mg | ORAL_TABLET | Freq: Two times a day (BID) | ORAL | Status: DC

## 2022-01-03 MED ORDER — AMIODARONE HCL IN DEXTROSE 360-4.14 MG/200ML-% IV SOLN
30.0000 mg/h | INTRAVENOUS | Status: DC
  Administered 2022-01-03: 30 mg/h via INTRAVENOUS
  Filled 2022-01-03: qty 200

## 2022-01-03 MED ORDER — ONDANSETRON HCL 4 MG/2ML IJ SOLN: 4.0000 mg | Freq: Four times a day (QID) | INTRAMUSCULAR | Status: DC | PRN

## 2022-01-03 MED ORDER — LACTATED RINGERS IV BOLUS
500.0000 mL | Freq: Once | INTRAVENOUS | Status: AC
  Administered 2022-01-03: 500 mL via INTRAVENOUS

## 2022-01-03 MED ORDER — SODIUM CHLORIDE 0.9 % IV SOLN: INTRAVENOUS | Status: DC

## 2022-01-03 MED ORDER — AMIODARONE LOAD VIA INFUSION
150.0000 mg | Freq: Once | INTRAVENOUS | Status: AC
  Administered 2022-01-03: 150 mg via INTRAVENOUS
  Filled 2022-01-03: qty 83.34

## 2022-01-03 NOTE — ED Provider Notes (Signed)
[facility] Provider Note    Event Date/Time   First MD Initiated Contact with Patient 01/03/22 1324     (approximate)   History   Chest Pain   HPI  Steven Pham is a 61 y.o. 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 fib with RVR which apparently is new diagnosis to the patient.  Patient states he was told to come to the emergency room because his blood pressure was low.  He states he has been feeling very weak since being discharged.  States he has some chest pressure whenever he takes his Eliquis although had some as well earlier today.  He is a somewhat poor historian unable to provide clear time course or onset or alleviation although he does not currently have any pain.  He states his abdominal pain is much improved since he was discharged from the hospital.  He denies any new vomiting, diarrhea, burning with urination, rash, extremity pain, cough or shortness of breath.  States he has been compliant with his Eliquis.  He denies tobacco abuse EtOH use or illicit drug use.  No other acute concerns at this time.      Physical Exam  Triage Vital Signs: ED Triage Vitals  Enc Vitals Group     BP 01/03/22 1208 (!) 88/73     Pulse Rate 01/03/22 1208 64     Resp 01/03/22 1208 18     Temp 01/03/22 1208 97.7 F (36.5 C)     Temp Source 01/03/22 1208 Oral     SpO2 01/03/22 1208 98 %     Weight 01/03/22 1200 234 lb (106.1 kg)     Height 01/03/22 1200 5\' 3"  (1.6 m)     Head Circumference --      Peak Flow --      Pain Score 01/03/22 1200 8     Pain Loc --      Pain Edu? --      Excl. in [place]? --      Most recent vital signs: Vitals:   01/03/22 1445 01/03/22 1456  BP:  91/68  Pulse:  (!) 129  Resp: (!) 31 16  Temp:    SpO2: 97% 98%    General: Awake, no distress.  CV:  There is a murmur.  Slightly decreased capillary refill.  Patient is tachycardic with irregular rhythm with Resp:  Normal effort.  Clear bilaterally Abd:  Soft nontender. Other:  Np Significant lower extreme edema   ED Results / Procedures / Treatments  Labs (all labs ordered are listed, but only abnormal results are displayed) Labs Reviewed  CBC - Abnormal; Notable for the following components:      Result Value   WBC 19.7 (*)    Hemoglobin 12.1 (*)    HCT 34.6 (*)    MCV 70.0 (*)    MCH 24.5 (*)    RDW 15.9 (*)    All other components within normal limits  MAGNESIUM - Abnormal; Notable for the following components:   Magnesium 2.5 (*)    All other components within normal limits  BASIC METABOLIC PANEL - Abnormal; Notable for the  following components:   BUN 37 (*)    Creatinine, Ser 1.53 (*)    Calcium 8.3 (*)    GFR, Estimated 52 (*)    All other components within normal limits  TROPONIN I (HIGH SENSITIVITY) - Abnormal; Notable for the following components:   Troponin I (High Sensitivity) 51 (*)    All other components within normal limits  RESP PANEL BY RT-PCR (FLU A&B, COVID) ARPGX2  CULTURE, BLOOD (ROUTINE X 2)  CULTURE, BLOOD (ROUTINE X 2)  PROCALCITONIN  BRAIN NATRIURETIC PEPTIDE  LACTIC ACID, PLASMA  LACTIC ACID, PLASMA  PROTIME-INR  APTT  URINALYSIS, COMPLETE (UACMP) WITH MICROSCOPIC  APTT  TROPONIN I (HIGH SENSITIVITY)     EKG  EKG is markable for A. fib with a ventricular rate of 139, unremarkable QTc interval and multiple nonspecific changes throughout.   RADIOLOGY  Chest x-ray interpreted myself shows no focal consolidation, effusion, edema, pneumothorax or other clear acute thoracic process.  I also reviewed radiology's interpretation and  agree.   PROCEDURES:  Critical Care performed: Yes, see critical care procedure note(s)  .Critical Care Performed by: Lucrezia Starch, MD Authorized by: Lucrezia Starch, MD   Critical care provider statement:    Critical care time (minutes):  30   Critical care was necessary to treat or prevent imminent or life-threatening deterioration of the following conditions:  Cardiac failure and circulatory failure   Critical care was time spent personally by me on the following activities:  Development of treatment plan with patient or surrogate, discussions with consultants, evaluation of patient's response to treatment, examination of patient, ordering and review of laboratory studies, ordering and review of radiographic studies, ordering and performing treatments and interventions, pulse oximetry, re-evaluation of patient's condition and review of old [place] ED: Medications  amiodarone (NEXTERONE) 1.8 mg/mL load via infusion 150 mg (150 mg Intravenous Bolus from Bag 01/03/22 1429)    Followed by  amiodarone (NEXTERONE PREMIX) 360-4.14 MG/200ML-% (1.8 mg/mL) IV infusion (60 mg/hr Intravenous New Bag/Given 01/03/22 1433)    Followed by  amiodarone (NEXTERONE PREMIX) 360-4.14 MG/200ML-% (1.8 mg/mL) IV infusion (has no administration in time range)  carvedilol (COREG) tablet 6.25 mg (has no administration in time range)  losartan (COZAAR) tablet 12.5 mg (has no administration in time range)  spironolactone (ALDACTONE) tablet 12.5 mg (has no administration in time range)  acetaminophen (TYLENOL) tablet 650 mg (has no administration in time range)  ondansetron (ZOFRAN) injection 4 mg (has no administration in time range)  traZODone (DESYREL) tablet 25 mg (has no administration in time range)  magnesium hydroxide (MILK OF MAGNESIA) suspension 30 mL (has no administration in time range)  heparin bolus via infusion 4,800 Units (has no administration in time range)  heparin  ADULT infusion 100 units/mL (25000 units/212mL) (has no administration in time range)  sodium chloride 0.9 % bolus 250 mL (has no administration in time range)  0.9 %  sodium chloride infusion (has no administration in time range)  lactated ringers bolus 500 mL (500 mLs Intravenous New Bag/Given 01/03/22 1428)     IMPRESSION / MDM / [place] / ED COURSE  I reviewed the triage vital signs and the nursing notes.                              I also reviewed patient's cardiology clinic visit note from today where he was seen by Dr. Sophronia Simas.  In this note Sophronia Simas notes that A. fib is a new diagnosis for the patient and he suspects possibly secondary to volume loss and renal failure given poor appetite and weight loss reported.  Recommended trial of fluids but if not improved and still hypotensive amiodarone.    Differential diagnosis includes, but is not limited to some chest pain and dizziness related to ACS, dehydration, metabolic derangements, arrhythmia with lower suspicion for PE given patient states he is compliant with his Eliquis.  He does not seem intoxicated denies any illicit drug use and Evalose patient for alcohol withdrawal at this time.  EKG is markable for A. fib with a ventricular rate of 139, unremarkable QTc interval and multiple nonspecific changes throughout.  Given hypotension arrival will defer rate control and start patient on amiodarone.  Chest x-ray interpreted myself shows no focal consolidation, effusion, edema, pneumothorax or other clear acute thoracic process.  I also reviewed radiology's interpretation and agree.  Magnesium is 2.5.  BMP remarkable for no significant electrolyte or metabolic derangements.  Kidney function appears stable.  Initial troponin is elevated 51.  Suspect this represents some demand.  2 weeks ago it was 113 which downtrend to 72 and a days ago was 15 and 13.  We will plan to trend this.  Her ASA start patient on heparin as this was  recommended by patient's cardiologist.  CBC remarkable for WBC count of 19.7.  Hemoglobin is 12.1 and platelets are within normal limits.  Unclear etiology for his leukocytosis at this time.  While procalcitonin is elevated at 55 it was greater than 152 weeks ago.  Patient is denying any other clear new infectious symptoms.  Will obtain blood cultures on abundance of caution as well as UA.  I discussed patient with hospitalist who agrees with need for admission.  Patient will be admitted to medicine service.  Medications  amiodarone (NEXTERONE) 1.8 mg/mL load via infusion 150 mg (150 mg Intravenous Bolus from Bag 01/03/22 1429)    Followed by  amiodarone (NEXTERONE PREMIX) 360-4.14 MG/200ML-% (1.8 mg/mL) IV infusion (60 mg/hr Intravenous New Bag/Given 01/03/22 1433)    Followed by  amiodarone (NEXTERONE PREMIX) 360-4.14 MG/200ML-% (1.8 mg/mL) IV infusion (has no administration in time range)  carvedilol (COREG) tablet 6.25 mg (has no administration in time range)  losartan (COZAAR) tablet 12.5 mg (has no administration in time range)  spironolactone (ALDACTONE) tablet 12.5 mg (has no administration in time range)  acetaminophen (TYLENOL) tablet 650 mg (has no administration in time range)  ondansetron (ZOFRAN) injection 4 mg (has no administration in time range)  traZODone (DESYREL) tablet 25 mg (has no administration in time range)  magnesium hydroxide (MILK OF MAGNESIA) suspension 30 mL (has no administration in time range)  heparin bolus via infusion 4,800 Units (has no administration in time range)  heparin ADULT infusion 100 units/mL (25000 units/232mL) (has no administration in time range)  sodium chloride 0.9 % bolus 250 mL (has no administration in time range)  0.9 %  sodium chloride infusion (has no administration in time range)  lactated ringers bolus 500 mL (500 mLs Intravenous New Bag/Given 01/03/22 1428)         FINAL CLINICAL IMPRESSION(S) / ED DIAGNOSES   Final diagnoses:   Atrial fibrillation with RVR (HCC)  Hypotension, unspecified hypotension type  SIRS (systemic inflammatory response syndrome) ([place])     Rx / DC Orders       Note:  This document was prepared using Dragon voice recognition software and may  include unintentional dictation errors.   Lucrezia Starch, MD 01/03/22 [phone]

## 2022-01-03 NOTE — Progress Notes (Signed)
Cardiology Office Note   Date:  01/03/2022   ID:  Steven Pham, DOB 24-Jul-1961, MRN [id number]  PCP:  Steven Bushy, MD  Cardiologist:   Steven Sacramento, MD   Chief Complaint  Patient presents with   Other    F/u hospital c/o drowsiness and discuss returning to work. Meds reviewed verbally with pt.      History of Present Illness: Steven Pham is a 61 y.o. male who presents for a follow-up visit regarding cardiomyopathy and recent hospitalization. He has known history of essential hypertension, obesity and obstructive sleep apnea.  He was hospitalized in December with diverticulitis complicated by acute renal failure.  He was noted to have intermittent tachycardia and thus had an echocardiogram done which showed an EF of 20 to 123456, grade 3 diastolic dysfunction and mild to moderate mitral regurgitation.  We switched him from metoprolol to carvedilol and added small dose losartan and spironolactone.  I recommended a right and left cardiac catheterization as an outpatient. He was rehospitalized at the end of December with portal vein thrombosis thought to be due to increased inflammation related to his diverticulitis.  He was hypotensive on presentation.  He was anticoagulated and ultimately discharged on Eliquis.  Due to hypotension and acute kidney injury, amlodipine, furosemide and losartan were held.  Since hospital discharge, he has been taking Eliquis only.  He has been feeling poorly with decreased appetite and dizziness.  No chest pain or shortness of breath.  He continues to have some abdominal pain but not as intense as during recent hospitalization. He is noted to be significantly tachycardic by exam today and EKG showed atrial fibrillation with RVR which is a new diagnosis.  Past Medical History:  Diagnosis Date   Arthritis    Cardiomyopathy ([place])    a. 11/2021 Echo: EF 20-25%, glob HK. GrIII DD. Nl RV size/fxn. Mildly elev PASP. Sev dil LA, mildly dil RA. Mild to mod MR.    Gout    Hypertension    NSVT (nonsustained ventricular tachycardia) 11/2021   PSVT (paroxysmal supraventricular tachycardia) ([place]) 11/2021    History reviewed. No pertinent surgical history.   Current Outpatient Medications  Medication Sig Dispense Refill   APIXABAN (ELIQUIS) VTE STARTER PACK (10MG  AND 5MG ) Take as directed on package: start with two-5mg  tablets twice daily for 7 days. On day 8, switch to one-5mg  tablet twice daily. 1 each 0   amLODipine (NORVASC) 5 MG tablet Take 1 tablet (5 mg total) by mouth daily. Hold until you see your cardiologist (Patient not taking: Reported on 01/03/2022)     carvedilol (COREG) 6.25 MG tablet Take 1 tablet (6.25 mg total) by mouth 2 (two) times daily with a meal. (Patient not taking: Reported on 01/03/2022) 60 tablet 2   colchicine 0.6 MG tablet Take 1 tablet (0.6 mg total) by mouth 2 (two) times daily. (Patient not taking: Reported on 01/03/2022) 60 tablet 0   losartan (COZAAR) 25 MG tablet Take 0.5 tablets (12.5 mg total) by mouth daily. Hold until you see your cardiologist (Patient not taking: Reported on 01/03/2022) 15 tablet 2   spironolactone (ALDACTONE) 25 MG tablet Take 0.5 tablets (12.5 mg total) by mouth daily. (Patient not taking: Reported on 01/03/2022) 15 tablet 2   No current facility-administered medications for this visit.    Allergies:   Patient has no known allergies.    Social History:  The patient  reports that he has never smoked. He has never used smokeless tobacco. He  reports that he does not drink alcohol and does not use drugs.   Family History:  The patient's family history is not on file.    ROS:  Please see the history of present illness.   Otherwise, review of systems are positive for none.   All other systems are reviewed and negative.    PHYSICAL EXAM: VS:  BP 90/60 (BP Location: Right Arm, Patient Position: Sitting, Cuff Size: Large)    Pulse 81    Ht 5\' 3"  (1.6 m)    Wt 243 lb 2 oz (110.3 kg)    SpO2 97%    BMI 43.07  kg/m  , BMI Body mass index is 43.07 kg/m. GEN: Well nourished, well developed, in no acute distress  HEENT: normal  Neck: no JVD, carotid bruits, or masses Cardiac: Irregularly irregular and tachycardic,  no murmurs, rubs, or gallops,no edema  Respiratory:  clear to auscultation bilaterally, normal work of breathing GI: soft, nontender, nondistended, + BS MS: no deformity or atrophy  Skin: warm and dry, no rash Neuro:  Strength and sensation are intact Psych: euthymic mood, full affect   EKG:  EKG is ordered today. The ekg ordered today demonstrates : Atrial fibrillation with rapid ventricular response.  Ventricular rate is 150 bpm.  LVH with repolarization abnormalities.   Recent Labs: 12/15/2021: TSH 0.822 12/17/2021: ALT 32 12/18/2021: Magnesium 2.0 12/25/2021: B Natriuretic Peptide 232.7 12/27/2021: BUN 26; Creatinine, Ser 1.50; Hemoglobin 10.5; Platelets 462; Potassium 4.2; Sodium 136    Lipid Panel No results found for: CHOL, TRIG, HDL, CHOLHDL, VLDL, LDLCALC, LDLDIRECT    Wt Readings from Last 3 Encounters:  01/03/22 243 lb 2 oz (110.3 kg)  12/25/21 245 lb (111.1 kg)  12/14/21 256 lb (116.1 kg)       No flowsheet data found.    ASSESSMENT AND PLAN:  1.  Newly diagnosed atrial fibrillation with rapid ventricular response: The patient is significantly tachycardic and is mildly hypotensive.  Recommend evaluation in the ED as I suspect that the patient might be volume depleted with renal failure given continued poor appetite and weight loss. Rate control might be limited by low blood pressure but this might improve with IV fluids.  If it does not, we could consider amiodarone drip for rate control given that he is on anticoagulation with Eliquis.  Eliquis should be switched to heparin drip in anticipation for need of right and left cardiac catheterization. Given degree of cardiomyopathy, I suspect that we will have to use an antiarrhythmic medication to try to keep  him in sinus rhythm.  2.  Chronic systolic heart failure: This was incidentally found recently during hospitalization for diverticulitis.  Ejection fraction was 20 to 25%.  Currently not on any heart failure medications due to hypotension and recent acute kidney injury.  The patient will require a right and left cardiac catheterization which can likely be performed during his hospitalization once he is stabilized.  3.  Recent portal vein thrombosis in the setting of diverticulitis: He is on anticoagulation with Eliquis.    Disposition: The patient will be taken to the emergency department for evaluation management of newly diagnosed A. fib with RVR as well as hypotension and suspected volume depletion on acute kidney injury.  The patient will require hospital admission.  Signed,  Steven Sacramento, MD  01/03/2022 11:21 AM    [place]

## 2022-01-03 NOTE — Progress Notes (Addendum)
ANTICOAGULATION CONSULT NOTE  Pharmacy Consult for heparin Indication: atrial fibrillation  No Known Allergies  Patient Measurements: Height: 5\' 3"  (160 cm) Weight: 106.1 kg (234 lb) IBW/kg (Calculated) : 56.9 Heparin Dosing Weight: 81 kg  Vital Signs: Temp: 97.7 F (36.5 C) (01/05 1208) Temp Source: Oral (01/05 1208) BP: 88/73 (01/05 1208) Pulse Rate: 64 (01/05 1208)  Labs: Recent Labs    01/03/22 1248  HGB 12.1*  HCT 34.6*  PLT 253    Estimated Creatinine Clearance: 56.7 mL/min (A) (by C-G formula based on SCr of 1.5 mg/dL (H)).   Medical History: Past Medical History:  Diagnosis Date   Arthritis    Cardiomyopathy (HCC)    a. 11/2021 Echo: EF 20-25%, glob HK. GrIII DD. Nl RV size/fxn. Mildly elev PASP. Sev dil LA, mildly dil RA. Mild to mod MR.   Gout    Hypertension    NSVT (nonsustained ventricular tachycardia) 11/2021   PSVT (paroxysmal supraventricular tachycardia) (HCC) 11/2021    Medications:  Eliquis PTA  Assessment: 61 year old male with new diagnosis of atrial fibrillation at Cardiology office this morning. He is on Eliquis for recent diagnosis of portal vein thrombosis during hospitalization in December. Pharmacy consulted for heparin management in anticipation for need of right and left cardiac catheterization.  Goal of Therapy:  Heparin level 0.3-0.7 units/ml aPTT 66-102 seconds Monitor platelets by anticoagulation protocol: Yes   Plan:  Last dose of Eliquis yesterday (1/4) am per patient Give 4800 unit bolus Start heparin infusion at 1350 units/hr Check aPTT in 6 hours. Will follow aPTT until correlation with HL Continue to monitor H&H and platelets  January, PharmD, BCPS Clinical Pharmacist 01/03/2022 2:37 PM

## 2022-01-03 NOTE — Telephone Encounter (Signed)
Patient calling to discuss recent illness and work.  Patient employer asking about returning to work. Patient doesn't feel like he is ready at this time but wants to know what MD thinks.   Please call to discuss provided a work Physicist, medical.

## 2022-01-03 NOTE — ED Provider Triage Note (Signed)
Emergency Medicine Provider Triage Evaluation Note  Steven Pham , a 61 y.o. male  was evaluated in triage.  Pt complains of chest pain off and on for 3-4 days.  History of A-fib with RVR  Review of Systems  Positive: Chest pain Negative: No nausea, vomiting or shortness of breath  Physical Exam  Ht 5\' 3"  (1.6 m)    Wt 106.1 kg    BMI 41.45 kg/m  Gen:   Awake, no distress  talking without any difficulty Resp:  Normal effort Lungs clear bilaterally MSK:   Moves extremities without difficulty no edema Other:    Medical Decision Making  Medically screening exam initiated at 12:17 PM.  Appropriate orders placed.  Steven Pham was informed that the remainder of the evaluation will be completed by another provider, this initial triage assessment does not replace that evaluation, and the importance of remaining in the ED until their evaluation is complete.     Steven Hai, PA-C 01/03/22 1221

## 2022-01-03 NOTE — Telephone Encounter (Signed)
Will discuss at today's office visit

## 2022-01-03 NOTE — ED Triage Notes (Signed)
Pt sent from [facility] with a-fib RVR, pt states he has been having palpitations with intermittent chest pain for the past 3 days, denies SOB, nausea or diaphoresis.Steven Pham

## 2022-01-03 NOTE — H&P (Signed)
Steven Pham   PATIENT NAME: Steven Pham    MR#:  [id number]  DATE OF BIRTH:  May 13, 1961  DATE OF ADMISSION:  01/03/2022  PRIMARY CARE PHYSICIAN: [facility], [facility]   Patient is coming from: Home  REQUESTING/REFERRING PHYSICIAN: Antoine Primas, MD  CHIEF COMPLAINT:   Chief Complaint  Patient presents with   Chest Pain    HISTORY OF PRESENT ILLNESS:  Steven Pham is a 61 y.o. African-American male with medical history significant for  Gout, HTN, combined CHF recently diagnosed with EF 20 to 25% and grade 3 DD, who presented to the emergency room with a Steven Pham of the chest pain and a feeling of her heart fluttering.  He was seen today by Dr. Kirke Corin who sent him to the emergency room for atrial fibrillation with rapid ventricular response.  The patient denies any dyspnea or cough or wheezing or hemoptysis.  No fever or chills.  ED Course: When he came to the ER blood pressure was 90/60 and later 88/73 with heart rate of 147 respiratory rate of 1823 and normal pulse oximetry.  Temperature was 97.7.  Labs are currently pending. EKG as reviewed by me : EKG showed atrial fibrillation with rapid ventricular response of 139 with left axis deviation and minimal voltage criteria for LVH with T wave inversion laterally. Imaging: 2 view chest x-ray showed no acute cardiopulmonary disease.  The patient was given 500 mill IV lactated ringer bolus as well as IV amiodarone bolus and drip, IV heparin bolus and drip.  He will be admitted to a stepdown unit bed for further evaluation and management. PAST MEDICAL HISTORY:   Past Medical History:  Diagnosis Date   Arthritis    Cardiomyopathy (HCC)    a. 11/2021 Echo: EF 20-25%, glob HK. GrIII DD. Nl RV size/fxn. Mildly elev PASP. Sev dil LA, mildly dil RA. Mild to mod MR.   Gout    Hypertension    NSVT (nonsustained ventricular tachycardia) 11/2021   PSVT (paroxysmal supraventricular tachycardia) (HCC) 11/2021  History  of portal vein thrombosis on Eliquis.  PAST SURGICAL HISTORY:  History reviewed. No pertinent surgical history.  SOCIAL HISTORY:   Social History   Tobacco Use   Smoking status: Never   Smokeless tobacco: Never  Substance Use Topics   Alcohol use: No    FAMILY HISTORY:   Positive for diabetes mellitus and hypertension in his mother. DRUG ALLERGIES:  No Known Allergies  REVIEW OF SYSTEMS:   ROS As per history of present illness. All pertinent systems were reviewed above. Constitutional, HEENT, cardiovascular, respiratory, GI, GU, musculoskeletal, neuro, psychiatric, endocrine, integumentary and hematologic systems were reviewed and are otherwise negative/unremarkable except for positive findings mentioned above in the HPI.   MEDICATIONS AT HOME:   Prior to Admission medications   Medication Sig Start Date End Date Taking? Authorizing Provider  amLODipine (NORVASC) 5 MG tablet Take 1 tablet (5 mg total) by mouth daily. Hold until you see your cardiologist Patient not taking: Reported on 01/03/2022 12/27/21   Arnetha Courser, MD  APIXABAN Everlene Balls) VTE STARTER PACK (10MG  AND 5MG ) Take as directed on package: start with two-5mg  tablets twice daily for 7 days. On day 8, switch to one-5mg  tablet twice daily. 12/27/21   Arnetha Courser, MD  carvedilol (COREG) 6.25 MG tablet Take 1 tablet (6.25 mg total) by mouth 2 (two) times daily with a meal. Patient not taking: Reported on 01/03/2022 12/19/21 03/19/22  Dorcas Carrow, MD  colchicine 0.6 MG  tablet Take 1 tablet (0.6 mg total) by mouth 2 (two) times daily. Patient not taking: Reported on 01/03/2022 12/27/21   Lorella Nimrod, MD  losartan (COZAAR) 25 MG tablet Take 0.5 tablets (12.5 mg total) by mouth daily. Hold until you see your cardiologist Patient not taking: Reported on 01/03/2022 12/27/21 03/27/22  Lorella Nimrod, MD  spironolactone (ALDACTONE) 25 MG tablet Take 0.5 tablets (12.5 mg total) by mouth daily. Patient not taking: Reported on  01/03/2022 12/20/21 03/20/22  Barb Merino, MD      VITAL SIGNS:  Blood pressure (!) 88/73, pulse 64, temperature 97.7 F (36.5 C), temperature source Oral, resp. rate 18, height 5\' 3"  (1.6 m), weight 106.1 kg, SpO2 98 %.  PHYSICAL EXAMINATION:  Physical Exam  GENERAL:  61 y.o.-year-old patient lying in the bed with no acute distress.  EYES: Pupils equal, round, reactive to light and accommodation. No scleral icterus. Extraocular muscles intact.  HEENT: Head atraumatic, normocephalic. Oropharynx and nasopharynx clear.  NECK:  Supple, no jugular venous distention. No thyroid enlargement, no tenderness.  LUNGS: Normal breath sounds bilaterally, no wheezing, rales,rhonchi or crepitation. No use of accessory muscles of respiration.  CARDIOVASCULAR: Irregularly irregular tachycardic rhythm S1, S2 normal. No murmurs, rubs, or gallops.  ABDOMEN: Soft, nondistended, nontender. Bowel sounds present. No organomegaly or mass.  EXTREMITIES: No pedal edema, cyanosis, or clubbing.  NEUROLOGIC: Cranial nerves II through XII are intact. Muscle strength 5/5 in all extremities. Sensation intact. Gait not checked.  PSYCHIATRIC: The patient is alert and oriented x 3.  Normal affect and good eye contact. SKIN: No obvious rash, lesion, or ulcer.   LABORATORY PANEL:   CBC Recent Labs  Lab 01/03/22 1248  WBC 19.7*  HGB 12.1*  HCT 34.6*  PLT 253   ------------------------------------------------------------------------------------------------------------------  Chemistries  No results for input(s): NA, K, CL, CO2, GLUCOSE, BUN, CREATININE, CALCIUM, MG, AST, ALT, ALKPHOS, BILITOT in the last 168 hours.  Invalid input(s): GFRCGP ------------------------------------------------------------------------------------------------------------------  Cardiac Enzymes No results for input(s): TROPONINI in the last 168  hours. ------------------------------------------------------------------------------------------------------------------  RADIOLOGY:  DG Chest 2 View  Result Date: 01/03/2022 CLINICAL DATA:  Pt sent from [facility] with a-fib RVR, pt states he has been having palpitations with intermittent chest pain for the past 3 days, denies SOB, nausea or diaphoresis. Hx. htn EXAM: CHEST - 2 VIEW COMPARISON:  12/25/2021 FINDINGS: Lungs are clear. Heart size and mediastinal contours are within normal limits. No effusion. Visualized bones unremarkable. IMPRESSION: No acute cardiopulmonary disease. Electronically Signed   By: Lucrezia Europe M.D.   On: 01/03/2022 12:47      IMPRESSION AND PLAN:  Principal Problem:   Atrial fibrillation with rapid ventricular response ([place])  1.  Atrial fibrillation with rapid ventricular response. - The patient will be admitted to a stepdown unit bed. - We will continue him on IV amiodarone drip. - We will continue IV heparin drip. - Cardiology consult will be obtained. - Dr. Rockey Situ was notified with the patient. - We will optimize his electrolytes.  2.  Essential hypertension with current hypotension. - We will hold off his antihypertensives.  3.  Gout. - We will use colchicine as needed.  4.  Chronic combined systolic and diastolic CHF. - He has no current exacerbation. - Diuretic therapy will be resumed with improved blood pressure.  DVT prophylaxis: Heparin. Code Status: full code.  Family Communication:  The plan of care was discussed in details with the patient (and family). I answered all questions. The patient agreed to proceed with  the above mentioned plan. Further management will depend upon hospital course. Disposition Plan: Back to previous home environment Consults called: Allergy. All the records are reviewed and case discussed with ED provider.  Status is: Inpatient   At the time of the admission, it appears that the appropriate admission status for this  patient is inpatient.  This is judged to be reasonable and necessary in order to provide the required intensity of service to ensure the patient's safety given the presenting symptoms, physical exam findings and initial radiographic and laboratory data in the context of comorbid conditions.  The patient requires inpatient status due to high intensity of service, high risk of further deterioration and high frequency of surveillance required.  I certify that at the time of admission, it is my clinical judgment that the patient will require inpatient hospital care extending more than 2 midnights.                            Dispo: The patient is from: Home              Anticipated d/c is to: Home              Patient currently is not medically stable to d/c.              Difficult to place patient: No     Christel Mormon M.D on 01/03/2022 at 2:26 PM  [facility]   From 7 PM-7 AM, contact night-coverage [email or web address]  CC: Primary care physician; [facility], [facility]

## 2022-01-04 ENCOUNTER — Encounter: Payer: Self-pay | Admitting: Family Medicine

## 2022-01-04 DIAGNOSIS — I5020 Unspecified systolic (congestive) heart failure: Secondary | ICD-10-CM

## 2022-01-04 DIAGNOSIS — I10 Essential (primary) hypertension: Secondary | ICD-10-CM

## 2022-01-04 LAB — URINE DRUG SCREEN, QUALITATIVE (ARMC ONLY)
Amphetamines, Ur Screen: NOT DETECTED
Barbiturates, Ur Screen: NOT DETECTED
Benzodiazepine, Ur Scrn: NOT DETECTED
Cannabinoid 50 Ng, Ur ~~LOC~~: NOT DETECTED
Cocaine Metabolite,Ur ~~LOC~~: NOT DETECTED
MDMA (Ecstasy)Ur Screen: NOT DETECTED
Methadone Scn, Ur: NOT DETECTED
Opiate, Ur Screen: NOT DETECTED
Phencyclidine (PCP) Ur S: NOT DETECTED
Tricyclic, Ur Screen: NOT DETECTED

## 2022-01-04 LAB — URINALYSIS, COMPLETE (UACMP) WITH MICROSCOPIC
Bilirubin Urine: NEGATIVE
Glucose, UA: NEGATIVE mg/dL
Hgb urine dipstick: NEGATIVE
Ketones, ur: NEGATIVE mg/dL
Leukocytes,Ua: NEGATIVE
Nitrite: NEGATIVE
Protein, ur: NEGATIVE mg/dL
Specific Gravity, Urine: 1.012 (ref 1.005–1.030)
pH: 5 (ref 5.0–8.0)

## 2022-01-04 LAB — CBC
HCT: 32.4 % — ABNORMAL LOW (ref 39.0–52.0)
Hemoglobin: 11 g/dL — ABNORMAL LOW (ref 13.0–17.0)
MCH: 25.1 pg — ABNORMAL LOW (ref 26.0–34.0)
MCHC: 34 g/dL (ref 30.0–36.0)
MCV: 73.8 fL — ABNORMAL LOW (ref 80.0–100.0)
Platelets: 225 10*3/uL (ref 150–400)
RBC: 4.39 MIL/uL (ref 4.22–5.81)
RDW: 16.1 % — ABNORMAL HIGH (ref 11.5–15.5)
WBC: 17.1 10*3/uL — ABNORMAL HIGH (ref 4.0–10.5)
nRBC: 0 % (ref 0.0–0.2)

## 2022-01-04 LAB — HEPARIN LEVEL (UNFRACTIONATED): Heparin Unfractionated: >1.1 IU/mL — ABNORMAL HIGH (ref 0.30–0.70)

## 2022-01-04 LAB — LIPID PANEL
Cholesterol: 226 mg/dL — ABNORMAL HIGH (ref 0–200)
HDL: 14 mg/dL — ABNORMAL LOW (ref >40)
LDL Cholesterol: 151 mg/dL — ABNORMAL HIGH (ref 0–99)
Total CHOL/HDL Ratio: 16.1 RATIO
Triglycerides: 305 mg/dL — ABNORMAL HIGH (ref <150)
VLDL: 61 mg/dL — ABNORMAL HIGH (ref 0–40)

## 2022-01-04 LAB — PROTIME-INR
INR: 1.2 (ref 0.8–1.2)
Prothrombin Time: 15.3 seconds — ABNORMAL HIGH (ref 11.4–15.2)

## 2022-01-04 LAB — RESP PANEL BY RT-PCR (FLU A&B, COVID) ARPGX2
Influenza A by PCR: NEGATIVE
Influenza B by PCR: NEGATIVE
SARS Coronavirus 2 by RT PCR: NEGATIVE

## 2022-01-04 LAB — BASIC METABOLIC PANEL
Anion gap: 10 (ref 5–15)
BUN: 28 mg/dL — ABNORMAL HIGH (ref 6–20)
CO2: 21 mmol/L — ABNORMAL LOW (ref 22–32)
Calcium: 8.2 mg/dL — ABNORMAL LOW (ref 8.9–10.3)
Chloride: 105 mmol/L (ref 98–111)
Creatinine, Ser: 1.52 mg/dL — ABNORMAL HIGH (ref 0.61–1.24)
GFR, Estimated: 52 mL/min — ABNORMAL LOW (ref >60)
Glucose, Bld: 107 mg/dL — ABNORMAL HIGH (ref 70–99)
Potassium: 4.4 mmol/L (ref 3.5–5.1)
Sodium: 136 mmol/L (ref 135–145)

## 2022-01-04 LAB — APTT
aPTT: 40 seconds — ABNORMAL HIGH (ref 24–36)
aPTT: 48 seconds — ABNORMAL HIGH (ref 24–36)

## 2022-01-04 LAB — LACTIC ACID, PLASMA: Lactic Acid, Venous: 3.8 mmol/L (ref 0.5–1.9)

## 2022-01-04 LAB — MAGNESIUM: Magnesium: 2.2 mg/dL (ref 1.7–2.4)

## 2022-01-04 LAB — TROPONIN I (HIGH SENSITIVITY): Troponin I (High Sensitivity): 36 ng/L — ABNORMAL HIGH (ref <18)

## 2022-01-04 LAB — BRAIN NATRIURETIC PEPTIDE: B Natriuretic Peptide: 450.2 pg/mL — ABNORMAL HIGH (ref 0.0–100.0)

## 2022-01-04 MED ORDER — SODIUM CHLORIDE 0.9 % IV SOLN: Freq: Once | INTRAVENOUS | Status: AC

## 2022-01-04 MED ORDER — AMIODARONE HCL 200 MG PO TABS
400.0000 mg | ORAL_TABLET | Freq: Two times a day (BID) | ORAL | Status: AC
  Administered 2022-01-04 – 2022-01-10 (×14): 400 mg via ORAL
  Filled 2022-01-04 (×15): qty 2

## 2022-01-04 MED ORDER — HEPARIN BOLUS VIA INFUSION
2400.0000 [IU] | Freq: Once | INTRAVENOUS | Status: AC
  Administered 2022-01-04: 2400 [IU] via INTRAVENOUS
  Filled 2022-01-04: qty 2400

## 2022-01-04 MED ORDER — CARVEDILOL 3.125 MG PO TABS: 3.1250 mg | ORAL_TABLET | Freq: Two times a day (BID) | ORAL | Status: DC

## 2022-01-04 MED ORDER — SODIUM CHLORIDE 0.9 % IV BOLUS
250.0000 mL | Freq: Once | INTRAVENOUS | Status: AC
Start: 2022-01-04 — End: 2022-01-04
  Administered 2022-01-04: 250 mL via INTRAVENOUS

## 2022-01-04 NOTE — ED Notes (Signed)
Patient taken to hallway bathroom via wheelchair.

## 2022-01-04 NOTE — ED Notes (Signed)
Report given to Hannah RN

## 2022-01-04 NOTE — ED Notes (Signed)
Dr. Ashok Pall contacted about blood pressure.

## 2022-01-04 NOTE — ED Notes (Signed)
Ryan Dunn PA-C and Dr. Kirke Corin approved giving PO Amniodarone. Both are aware of blood pressure.

## 2022-01-04 NOTE — Consult Note (Signed)
Cardiology Consultation:   Patient ID: Steven Pham; [id number]; 03/03/1961   Admit date: 01/03/2022 Date of Consult: 01/04/2022  Primary Care Provider: [facility] Primary Cardiologist: Kirke Corin Primary Electrophysiologist:  None   Patient Profile:   Steven Pham is a 61 y.o. male with a hx of HFrEF, HTN, obesity, anemia, and OSA who is being seen today for the evaluation of Afib with RVR and HFrEF at the request of Dr. Arville Care.  History of Present Illness:   [name] was recently admitted to the hospital in 11/2021 with diverticulitis complicated by ARF. He was noted to have intermittent tachycardia and thus had an echo done which showed an EF of 20 to 25%, Gr3DD, and mild to moderate mitral regurgitation.  He was switched from metoprolol to carvedilol and small dose losartan and spironolactone were added.  Outpatient R/LHC was recommended.  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  He was hypotensive on presentation.  He was anticoagulated and ultimately discharged on Eliquis.  Due to hypotension and acute kidney injury, amlodipine, furosemide and losartan were held.  He was seen in hospital follow up on 01/03/2022, and had been taking Eliquis only.  He was feeling poorly with decreased appetite and dizziness.  No chest pain or shortness of breath.  He continued to have some abdominal pain but not as intense as during recent hospitalization.  He was significantly tachycardic by exam, and EKG showed atrial fibrillation with RVR, which is a new diagnosis.  In this setting, he was recommended to go to the ED for admission with recommendation to start IV amiodarone, hold Eliquis and start IV heparin with plans for [facility] prior to discharge.   Upon his arrival to the ED, his BP has been soft in the 70s to 90s systolic.  Repeat EKG showed Afib with RVR with a ventricular rate of 139  bpm, LVH, poor R wave progression along the precordial leads, and nonspecific st/t changes. Initial troponin 51 with a delta troponin of 36, BNP 450, WBC 19.7, HGB 12.1, magnesium 2.5, potassium 4.5, BUN 37, SCr 1.53, PCT 55.57, CXR without acute cardiopulmonary process. In the ED, he received LR and upon admission was placed on IV amiodarone and heparin. In the ED, he converted to sinus rhythm around 3 PM on 1/5 followed by a brief episode of atrial flutter around 7 PM and has maintained sinus rhythm since around 7:30 PM on 1/5. BP remains soft, though is stable. He reports his chest tightness and palpitations are resolved.   Past Medical History:  Diagnosis Date   Arthritis    Cardiomyopathy (HCC)    a. 11/2021 Echo: EF 20-25%, glob HK. GrIII DD. Nl RV size/fxn. Mildly elev PASP. Sev dil LA, mildly dil RA. Mild to mod MR.   Gout    Hypertension    NSVT (nonsustained ventricular tachycardia) 11/2021   PSVT (paroxysmal supraventricular tachycardia) (HCC) 11/2021    History reviewed. No pertinent surgical history.   Home Meds: Prior to Admission medications   Medication Sig Start Date End Date Taking? Authorizing Provider  amLODipine (NORVASC) 5 MG tablet Take 1 tablet (5 mg total) by mouth daily. Hold until you see your cardiologist Patient not taking: Reported on 01/03/2022 12/27/21   Arnetha Courser, MD  APIXABAN Everlene Balls) VTE STARTER PACK (10MG  AND 5MG ) Take as directed on package: start with two-5mg  tablets twice daily for 7 days.  On day 8, switch to one-5mg  tablet twice daily. 12/27/21   Lorella Nimrod, MD  carvedilol (COREG) 6.25 MG tablet Take 1 tablet (6.25 mg total) by mouth 2 (two) times daily with a meal. Patient not taking: Reported on 01/03/2022 12/19/21 03/19/22  Barb Merino, MD  colchicine 0.6 MG tablet Take 1 tablet (0.6 mg total) by mouth 2 (two) times daily. Patient not taking: Reported on 01/03/2022 12/27/21   Lorella Nimrod, MD  losartan (COZAAR) 25 MG tablet Take 0.5 tablets  (12.5 mg total) by mouth daily. Hold until you see your cardiologist Patient not taking: Reported on 01/03/2022 12/27/21 03/27/22  Lorella Nimrod, MD  spironolactone (ALDACTONE) 25 MG tablet Take 0.5 tablets (12.5 mg total) by mouth daily. Patient not taking: Reported on 01/03/2022 12/20/21 03/20/22  Barb Merino, MD    Inpatient Medications: Scheduled Meds:  Continuous Infusions:  amiodarone 30 mg/hr (01/03/22 2307)   heparin 1,600 Units/hr (01/04/22 0757)   PRN Meds: acetaminophen, magnesium hydroxide, ondansetron (ZOFRAN) IV, traZODone  Allergies:  No Known Allergies  Social History:   Social History   Socioeconomic History   Marital status: Single    Spouse name: Not on file   Number of children: Not on file   Years of education: Not on file   Highest education level: Not on file  Occupational History   Not on file  Tobacco Use   Smoking status: Never   Smokeless tobacco: Never  Vaping Use   Vaping Use: Never used  Substance and Sexual Activity   Alcohol use: No   Drug use: No   Sexual activity: Not on file  Other Topics Concern   Not on file  Social History Narrative   Lives locally by himself.  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  Does not routinely exercise.   Social Determinants of Health   Financial Resource Strain: Not on file  Food Insecurity: Not on file  Transportation Needs: Not on file  Physical Activity: Not on file  Stress: Not on file  Social Connections: Not on file  Intimate Partner Violence: Not on file     Family History:   Family History  Family history unknown: Yes    ROS:  Review of Systems  Constitutional:  Positive for malaise/fatigue. Negative for chills, diaphoresis, fever and weight loss.  HENT:  Negative for congestion.   Eyes:  Negative for discharge and redness.  Respiratory:  Negative for cough, sputum production, shortness of breath and wheezing.   Cardiovascular:  Positive for palpitations. Negative for chest pain,  orthopnea, claudication, leg swelling and PND.       Chest tightness and palpitations improved  Gastrointestinal:  Negative for abdominal pain, blood in stool, heartburn, melena, nausea and vomiting.  Musculoskeletal:  Negative for falls and myalgias.  Skin:  Negative for rash.  Neurological:  Negative for dizziness, tingling, tremors, sensory change, speech change, focal weakness, loss of consciousness and weakness.  Endo/Heme/Allergies:  Does not bruise/bleed easily.  Psychiatric/Behavioral:  Negative for substance abuse. The patient is not nervous/anxious.   All other systems reviewed and are negative.    Physical Exam/Data:   Vitals:   01/04/22 0230 01/04/22 0300 01/04/22 0552 01/04/22 0607  BP: 96/71 92/69  133/89  Pulse: 87 87  (!) 110  Resp:    (!) 22  Temp:   98.9 F (37.2 C) 98.9 F (37.2 C)  TempSrc:   Oral Oral  SpO2: 96% 95%  94%  Weight:      Height:  Intake/Output Summary (Last 24 hours) at 01/04/2022 0843 Last data filed at 01/03/2022 2350 Gross per 24 hour  Intake --  Output 725 ml  Net -725 ml   Filed Weights   01/03/22 1200  Weight: 106.1 kg   Body mass index is 41.45 kg/m.   Physical Exam: General: Well developed, well nourished, in no acute distress. Head: Normocephalic, atraumatic, sclera non-icteric, no xanthomas, nares without discharge.  Neck: Negative for carotid bruits. JVD not elevated. Lungs: Clear bilaterally to auscultation without wheezes, rales, or rhonchi. Breathing is unlabored. Heart: RRR with S1 S2. No murmurs, rubs, or gallops appreciated. Abdomen: Soft, non-tender, non-distended with normoactive bowel sounds. No hepatomegaly. No rebound/guarding. No obvious abdominal masses. Msk:  Strength and tone appear normal for age. Extremities: No clubbing or cyanosis. No edema. Distal pedal pulses are 2+ and equal bilaterally. Neuro: Alert and oriented X 3. No facial asymmetry. No focal deficit. Moves all extremities  spontaneously. Psych:  Responds to questions appropriately with a normal affect.   EKG:  The EKG was personally reviewed and demonstrates: Afib with RVR, 139 bpm, left axis deviation, poor R wave progression along the precordial leads, LVH, nonspecific lateral st/t changes Telemetry:  Telemetry was personally reviewed and demonstrates: Afib initially with conversion to sinus rhythm around 1500 on 1/5, brief atrial flutter around 7 PM with conversion to sinus rhythm around 7:30 PM, sinus rhythm since  Weights: Filed Weights   01/03/22 1200  Weight: 106.1 kg    Relevant CV Studies:  2D echo 12/18/2021: 1. Left ventricular ejection fraction, by estimation, is 20 to 25%. The  left ventricle has severely decreased function. The left ventricle  demonstrates global hypokinesis. The left ventricular internal cavity size  was mildly dilated. There is mild left  ventricular hypertrophy. Left ventricular diastolic parameters are  consistent with Grade III diastolic dysfunction (restrictive).   2. Right ventricular systolic function is normal. The right ventricular  size is normal. There is mildly elevated pulmonary artery systolic  pressure.   3. Left atrial size was severely dilated.   4. Right atrial size was mildly dilated.   5. The mitral valve is normal in structure. Mild to moderate mitral valve  regurgitation. No evidence of mitral stenosis.   6. The aortic valve is normal in structure. Aortic valve regurgitation is  not visualized. No aortic stenosis is present.   7. The inferior vena cava is normal in size with greater than 50%  respiratory variability, suggesting right atrial pressure of 3 mmHg.  Laboratory Data:  Chemistry Recent Labs  Lab 01/03/22 1414 01/04/22 0550  NA 136 136  K 4.5 4.4  CL 103 105  CO2 23 21*  GLUCOSE 99 107*  BUN 37* 28*  CREATININE 1.53* 1.52*  CALCIUM 8.3* 8.2*  GFRNONAA 52* 52*  ANIONGAP 10 10    No results for input(s): PROT, ALBUMIN, AST,  ALT, ALKPHOS, BILITOT in the last 168 hours. Hematology Recent Labs  Lab 01/03/22 1248 01/04/22 0550  WBC 19.7* 17.1*  RBC 4.94 4.39  HGB 12.1* 11.0*  HCT 34.6* 32.4*  MCV 70.0* 73.8*  MCH 24.5* 25.1*  MCHC 35.0 34.0  RDW 15.9* 16.1*  PLT 253 225   Cardiac EnzymesNo results for input(s): TROPONINI in the last 168 hours. No results for input(s): TROPIPOC in the last 168 hours.  BNP Recent Labs  Lab 01/04/22 0550  BNP 450.2*    DDimer No results for input(s): DDIMER in the last 168 hours.  Radiology/Studies:  DG Chest  2 View  Result Date: 01/03/2022 IMPRESSION: No acute cardiopulmonary disease. Electronically Signed   By: Lucrezia Europe M.D.   On: 01/03/2022 12:47     Assessment and Plan:   1. Newly diagnosed Afib/flutter with RVR: -Has maintained sinus rhythm since last evening -Hypotension continues to preclude initiation of beta blocker -Given he is maintaining sinus, and in the context of soft BP, stop amiodarone gtt and start oral amiodarone 400 mg bid x 1 week, followed by 200 mg x 1 week, followed by 200 mg daily thereafter  -Hopefully, with the discontinuation of IV amiodarone, we will have BP room to add a low dose beta blocker moving forward  -Given degree of cardiomyopathy, will likely need AAD to try and maintain sinus rhythm -CHADS2VASc at least 3 (CHF, HTN, vascular disease) -Heparin gtt for now, resume PTA Eliquis at discharge -Recent TSH normal -Potassium and magnesium at goal  2. HFrEF: -Appears euvolemic -Initiation of GDMT continues to be limited by hypotension, escalate as able -Schedule R/LHC for 1/9  3. Recent portal vein thrombosis in the setting of diverticulitis: -IV heparin for now -Will need to resume PTA Eliquis at discharge    Shared Decision Making/Informed Consent{  The risks [stroke (1 in 1000), death (1 in 1000), kidney failure [usually temporary] (1 in 500), bleeding (1 in 200), allergic reaction [possibly serious] (1 in 200)],  benefits (diagnostic support and management of coronary artery disease) and alternatives of a cardiac catheterization were discussed in detail with Mr. Wooldridge and he is willing to proceed.    For questions or updates, please contact [place] Please consult [email or web address] for contact info under Cardiology/STEMI.   Signed, Christell Faith, PA-C [place] Pager: [phone] 01/04/2022, 8:43 AM

## 2022-01-04 NOTE — ED Notes (Signed)
Lab called about covid swab and stated there was no swab sent. This tech collected and sent swab to lab at this time.

## 2022-01-04 NOTE — Progress Notes (Signed)
ANTICOAGULATION CONSULT NOTE  Pharmacy Consult for heparin Indication: atrial fibrillation  No Known Allergies  Patient Measurements: Height: 5\' 3"  (160 cm) Weight: 106.1 kg (234 lb) IBW/kg (Calculated) : 56.9 Heparin Dosing Weight: 81 kg  Vital Signs: Temp: 98.9 F (37.2 C) (01/06 0607) Temp Source: Oral (01/06 0607) BP: 91/63 (01/06 1449) Pulse Rate: 74 (01/06 1449)  Labs: Recent Labs    01/03/22 1248 01/03/22 1414 01/04/22 0550 01/04/22 1330  HGB 12.1*  --  11.0*  --   HCT 34.6*  --  32.4*  --   PLT 253  --  225  --   APTT  --   --  40* 48*  LABPROT  --   --  15.3*  --   INR  --   --  1.2  --   HEPARINUNFRC  --   --  >1.10*  --   CREATININE  --  1.53* 1.52*  --   TROPONINIHS  --  51* 36*  --      Estimated Creatinine Clearance: 56 mL/min (A) (by C-G formula based on SCr of 1.52 mg/dL (H)).   Medical History: Past Medical History:  Diagnosis Date   Arthritis    Cardiomyopathy (HCC)    a. 11/2021 Echo: EF 20-25%, glob HK. GrIII DD. Nl RV size/fxn. Mildly elev PASP. Sev dil LA, mildly dil RA. Mild to mod MR.   Gout    Hypertension    NSVT (nonsustained ventricular tachycardia) 11/2021   PSVT (paroxysmal supraventricular tachycardia) (HCC) 11/2021    Medications:  Eliquis PTA  Assessment: 61 year old male with new diagnosis of atrial fibrillation at Cardiology office this morning. He is on Eliquis for recent diagnosis of portal vein thrombosis during hospitalization in December. Pharmacy consulted for heparin management in anticipation for need of right and left cardiac catheterization.  Baseline: aPPT/HL/INR never collected   01/04/22 0550 aPTT 40 sec, HL > 1.10  SUBtherapeutic  01/04/22 1330 aPTT 48 sec,  SUBtherapeutic  Goal of Therapy:  Heparin level 0.3-0.7 units/ml aPTT 66-102 seconds Monitor platelets by anticoagulation protocol: Yes   Plan:  Heparin (aPTT) SUBtherapeutic Give 2400 unit bolus Increase heparin infusion to 1850 units/hr Check  aPTT in 6 hours following rate change  Will follow aPTT until correlation with HL Continue to monitor H&H and platelets  03/04/22, PharmD, BCPS 01/04/2022 5:56 PM

## 2022-01-04 NOTE — Progress Notes (Signed)
ANTICOAGULATION CONSULT NOTE  Pharmacy Consult for heparin Indication: atrial fibrillation  No Known Allergies  Patient Measurements: Height: 5\' 3"  (160 cm) Weight: 106.1 kg (234 lb) IBW/kg (Calculated) : 56.9 Heparin Dosing Weight: 81 kg  Vital Signs: Temp: 98.6 F (37 C) (01/05 2134) Temp Source: Oral (01/05 2134) BP: 106/69 (01/05 2134) Pulse Rate: 90 (01/05 2134)  Labs: Recent Labs    01/03/22 1248 01/03/22 1414  HGB 12.1*  --   HCT 34.6*  --   PLT 253  --   CREATININE  --  1.53*  TROPONINIHS  --  51*     Estimated Creatinine Clearance: 55.6 mL/min (A) (by C-G formula based on SCr of 1.53 mg/dL (H)).   Medical History: Past Medical History:  Diagnosis Date   Arthritis    Cardiomyopathy (HCC)    a. 11/2021 Echo: EF 20-25%, glob HK. GrIII DD. Nl RV size/fxn. Mildly elev PASP. Sev dil LA, mildly dil RA. Mild to mod MR.   Gout    Hypertension    NSVT (nonsustained ventricular tachycardia) 11/2021   PSVT (paroxysmal supraventricular tachycardia) (HCC) 11/2021    Medications:  Eliquis PTA  Assessment: 61 year old male with new diagnosis of atrial fibrillation at Cardiology office this morning. He is on Eliquis for recent diagnosis of portal vein thrombosis during hospitalization in December. Pharmacy consulted for heparin management in anticipation for need of right and left cardiac catheterization.  Baseline: aPPT/HL/INR never collected   01/04/22 0550 aPTT 40 sec, HL > 1.10  SUBtherapeutic   Goal of Therapy:  Heparin level 0.3-0.7 units/ml aPTT 66-102 seconds Monitor platelets by anticoagulation protocol: Yes   Plan:  Heparin (aPTT) SUBtherapeutic Give 2400 unit bolus Increase heparin infusion to 1600 units/hr Check aPTT in 6 hours following rate Will follow aPTT until correlation with HL Continue to monitor H&H and platelets  03/04/22, PharmD, BCPS Clinical Pharmacist 01/04/2022 12:55 AM

## 2022-01-04 NOTE — Progress Notes (Signed)
PROGRESS NOTE    Steven Pham  [id number] DOB: Feb 14, 1961 DOA: 01/03/2022 PCP: [facility] [place]  Outpatient Specialists: none    Brief Narrative:   8m hx htn, osa not on cpap, two recent hospital admissions first for diverticulitis where found to have systolic heart failure and again for gout flare where found to have portal vein thrombosis, sent in 1/5 from cardiology clinic for a fib with rvr.   Assessment & Plan:   Principal Problem:   Atrial fibrillation with rapid ventricular response (HCC) Active Problems:   Acute combined systolic and diastolic congestive heart failure (HCC)   Portal vein thrombosis   HTN (hypertension)  # Atrial fibrillation with rapid ventricular response Hr improved now low 100s on amio gtt. Hemodynamically stable. - cardiology to see - maintain amio gtt  - heparin gtt - further evidence-based therapy per cardiology - possible cath this admission. Has eaten this morning  # Combined systolic/diastolic heart failure Recent echo with ef 20-25%, grade 3 dd, mild/mod mitral regurg. Here appears mildly volume overload, breathing comfortably on room air - diuresis and therapeutics per cardiology  # OSA Not on cpap - treatment would be helpful  # Portal vein thrombosis Recent diagnosis. Blood cultures negative - heparin as above, revert back to eliquis when safe to do so - outpt vascular surg f/u  # Recent diverticulitis No s/s recurrence - outpt GI f/u  # Elevated procalcitonin Recent admissions for diverticulitis, portal vein thrombosis. No localizing symptoms of infection. 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, here cr 1.52 - monitor  # HTN Here normotensive - monitor   DVT prophylaxis: IV heparin Code Status: full Family Communication: none @ bedside  Level of care: Stepdown Status is:  Inpatient  Remains inpatient appropriate because: severity of illness        Consultants:  cardiology  Procedures: none  Antimicrobials:  none    Subjective: No complaints, no chest pain or palpitations, no abd pain, no n/v/d, no dysuria  Objective: Vitals:   01/04/22 0230 01/04/22 0300 01/04/22 0552 01/04/22 0607  BP: 96/71 92/69  133/89  Pulse: 87 87  (!) 110  Resp:    (!) 22  Temp:   98.9 F (37.2 C) 98.9 F (37.2 C)  TempSrc:   Oral Oral  SpO2: 96% 95%  94%  Weight:      Height:        Intake/Output Summary (Last 24 hours) at 01/04/2022 0805 Last data filed at 01/03/2022 2350 Gross per 24 hour  Intake --  Output 725 ml  Net -725 ml   Filed Weights   01/03/22 1200  Weight: 106.1 kg    Examination:  General exam: Appears calm and comfortable  Respiratory system: Clear to auscultation. Respiratory effort normal. Cardiovascular system: S1 & S2 heard, tachycardic, mild systolic murmur Gastrointestinal system: Abdomen is distended, soft and nontender. No organomegaly or masses felt. Normal bowel sounds heard. Central nervous system: Alert and oriented. No focal neurological deficits. Extremities: Symmetric 5 x 5 power. Skin: No rashes, lesions or ulcers. Pitting edema knees Psychiatry: Judgement and insight appear normal. Mood & affect appropriate.     Data Reviewed: I have personally reviewed following labs and imaging studies  CBC: Recent Labs  Lab 01/03/22 1248 01/04/22 0550  WBC 19.7* 17.1*  HGB 12.1* 11.0*  HCT 34.6* 32.4*  MCV 70.0* 73.8*  PLT 253 225  Basic Metabolic Panel: Recent Labs  Lab 01/03/22 1414 01/04/22 0550  NA 136 136  K 4.5 4.4  CL 103 105  CO2 23 21*  GLUCOSE 99 107*  BUN 37* 28*  CREATININE 1.53* 1.52*  CALCIUM 8.3* 8.2*  MG 2.5* 2.2   GFR: Estimated Creatinine Clearance: 56 mL/min (A) (by C-G formula based on SCr of 1.52 mg/dL (H)). Liver Function Tests: No results for input(s): AST, ALT, ALKPHOS, BILITOT,  PROT, ALBUMIN in the last 168 hours. No results for input(s): LIPASE, AMYLASE in the last 168 hours. No results for input(s): AMMONIA in the last 168 hours. Coagulation Profile: No results for input(s): INR, PROTIME in the last 168 hours. Cardiac Enzymes: No results for input(s): CKTOTAL, CKMB, CKMBINDEX, TROPONINI in the last 168 hours. BNP (last 3 results) No results for input(s): PROBNP in the last 8760 hours. HbA1C: No results for input(s): HGBA1C in the last 72 hours. CBG: No results for input(s): GLUCAP in the last 168 hours. Lipid Profile: Recent Labs    01/04/22 0550  CHOL 226*  HDL 14*  LDLCALC 151*  TRIG 305*  CHOLHDL 16.1   Thyroid Function Tests: No results for input(s): TSH, T4TOTAL, FREET4, T3FREE, THYROIDAB in the last 72 hours. Anemia Panel: No results for input(s): VITAMINB12, FOLATE, FERRITIN, TIBC, IRON, RETICCTPCT in the last 72 hours. Urine analysis:    Component Value Date/Time   COLORURINE YELLOW (A) 12/14/2021 0749   APPEARANCEUR HAZY (A) 12/14/2021 0749   LABSPEC 1.029 12/14/2021 0749   PHURINE 5.0 12/14/2021 0749   GLUCOSEU NEGATIVE 12/14/2021 0749   HGBUR MODERATE (A) 12/14/2021 0749   BILIRUBINUR NEGATIVE 12/14/2021 0749   KETONESUR NEGATIVE 12/14/2021 0749   PROTEINUR 30 (A) 12/14/2021 0749   NITRITE NEGATIVE 12/14/2021 0749   LEUKOCYTESUR NEGATIVE 12/14/2021 0749   Sepsis Labs: @LABRCNTIP (procalcitonin:4,lacticidven:4)  ) Recent Results (from the past 240 hour(s))  Blood culture (routine x 2)     Status: None   Collection Time: 12/25/21 10:44 PM   Specimen: BLOOD  Result Value Ref Range Status   Specimen Description BLOOD LEFT WRIST  Final   Special Requests   Final    BOTTLES DRAWN AEROBIC AND ANAEROBIC Blood Culture adequate volume   Culture   Final    NO GROWTH 5 DAYS Performed at [facility], [address] [place], [place] [address]    Report Status 12/30/2021 FINAL  Final  Blood culture (routine x 2)      Status: None   Collection Time: 12/25/21 10:44 PM   Specimen: BLOOD  Result Value Ref Range Status   Specimen Description BLOOD RIGHT HAND  Final   Special Requests   Final    BOTTLES DRAWN AEROBIC AND ANAEROBIC Blood Culture results may not be optimal due to an inadequate volume of blood received in culture bottles   Culture   Final    NO GROWTH 5 DAYS Performed at [facility], [address] [place], [place] [address]    Report Status 12/30/2021 FINAL  Final  Resp Panel by RT-PCR (Flu A&B, Covid) Nasopharyngeal Swab     Status: None   Collection Time: 12/25/21 11:50 PM   Specimen: Nasopharyngeal Swab; Nasopharyngeal(NP) swabs in vial transport medium  Result Value Ref Range Status   SARS Coronavirus 2 by RT PCR NEGATIVE NEGATIVE Final    Comment: (NOTE) SARS-CoV-2 target nucleic acids are NOT DETECTED.  The SARS-CoV-2 RNA is generally detectable in upper respiratory specimens during the acute phase of infection. The  lowest concentration of SARS-CoV-2 viral copies this assay can detect is 138 copies/mL. A negative result does not preclude SARS-Cov-2 infection and should not be used as the sole basis for treatment or other patient management decisions. A negative result may occur with  improper specimen collection/handling, submission of specimen other than nasopharyngeal swab, presence of viral mutation(s) within the areas targeted by this assay, and inadequate number of viral copies(<138 copies/mL). A negative result must be combined with clinical observations, patient history, and epidemiological information. The expected result is Negative.  Fact Sheet for Patients:  EntrepreneurPulse.com.au  Fact Sheet for Healthcare Providers:  IncredibleEmployment.be  This test is no t yet approved or cleared by the [place] FDA and  has been authorized for detection and/or diagnosis of SARS-CoV-2 by FDA under an Emergency Use  Authorization (EUA). This EUA will remain  in effect (meaning this test can be used) for the duration of the COVID-19 declaration under Section 564(b)(1) of the Act, 21 U.S.C.section 360bbb-3(b)(1), unless the authorization is terminated  or revoked sooner.       Influenza A by PCR NEGATIVE NEGATIVE Final   Influenza B by PCR NEGATIVE NEGATIVE Final    Comment: (NOTE) The Xpert Xpress SARS-CoV-2/FLU/RSV plus assay is intended as an aid in the diagnosis of influenza from Nasopharyngeal swab specimens and should not be used as a sole basis for treatment. Nasal washings and aspirates are unacceptable for Xpert Xpress SARS-CoV-2/FLU/RSV testing.  Fact Sheet for Patients: EntrepreneurPulse.com.au  Fact Sheet for Healthcare Providers: IncredibleEmployment.be  This test is not yet approved or cleared by the [place] FDA and has been authorized for detection and/or diagnosis of SARS-CoV-2 by FDA under an Emergency Use Authorization (EUA). This EUA will remain in effect (meaning this test can be used) for the duration of the COVID-19 declaration under Section 564(b)(1) of the Act, 21 U.S.C. section 360bbb-3(b)(1), unless the authorization is terminated or revoked.  Performed at [facility], [address], [place],  [address]          Radiology Studies: DG Chest 2 View  Result Date: 01/03/2022 CLINICAL DATA:  Pt sent from [facility] with a-fib RVR, pt states he has been having palpitations with intermittent chest pain for the past 3 days, denies SOB, nausea or diaphoresis. Hx. htn EXAM: CHEST - 2 VIEW COMPARISON:  12/25/2021 FINDINGS: Lungs are clear. Heart size and mediastinal contours are within normal limits. No effusion. Visualized bones unremarkable. IMPRESSION: No acute cardiopulmonary disease. Electronically Signed   By: Lucrezia Europe M.D.   On: 01/03/2022 12:47   [place] EKG SITE RITE  Result Date: 01/03/2022 If Site Rite image not  attached, placement could not be confirmed due to current cardiac rhythm.       Scheduled Meds: Continuous Infusions:  sodium chloride 100 mL/hr at 01/04/22 [id number]   amiodarone 30 mg/hr (01/03/22 2307)   heparin 1,600 Units/hr (01/04/22 0757)     LOS: 1 day    Time spent: 60  min    Desma Maxim, MD [facility]   If 7PM-7AM, please contact night-coverage [email or web address] Password [email or web address] 01/04/2022, 8:05 AM

## 2022-01-04 NOTE — ED Notes (Signed)
Dr. Fletcher Anon aware of b/p. New order received for NS infusion.

## 2022-01-05 ENCOUNTER — Encounter: Payer: Self-pay | Admitting: Obstetrics and Gynecology

## 2022-01-05 ENCOUNTER — Inpatient Hospital Stay: Payer: BC Managed Care – PPO

## 2022-01-05 DIAGNOSIS — Z7901 Long term (current) use of anticoagulants: Secondary | ICD-10-CM

## 2022-01-05 LAB — CBC
HCT: 24.1 % — ABNORMAL LOW (ref 39.0–52.0)
Hemoglobin: 8.3 g/dL — ABNORMAL LOW (ref 13.0–17.0)
MCH: 24.6 pg — ABNORMAL LOW (ref 26.0–34.0)
MCHC: 34.4 g/dL (ref 30.0–36.0)
MCV: 71.5 fL — ABNORMAL LOW (ref 80.0–100.0)
Platelets: 198 10*3/uL (ref 150–400)
RBC: 3.37 MIL/uL — ABNORMAL LOW (ref 4.22–5.81)
RDW: 15.8 % — ABNORMAL HIGH (ref 11.5–15.5)
WBC: 20.5 10*3/uL — ABNORMAL HIGH (ref 4.0–10.5)
nRBC: 0 % (ref 0.0–0.2)

## 2022-01-05 LAB — BASIC METABOLIC PANEL
Anion gap: 8 (ref 5–15)
BUN: 17 mg/dL (ref 6–20)
CO2: 22 mmol/L (ref 22–32)
Calcium: 7.8 mg/dL — ABNORMAL LOW (ref 8.9–10.3)
Chloride: 106 mmol/L (ref 98–111)
Creatinine, Ser: 1.34 mg/dL — ABNORMAL HIGH (ref 0.61–1.24)
GFR, Estimated: >60 mL/min (ref >60)
Glucose, Bld: 109 mg/dL — ABNORMAL HIGH (ref 70–99)
Potassium: 4.1 mmol/L (ref 3.5–5.1)
Sodium: 136 mmol/L (ref 135–145)

## 2022-01-05 LAB — BLOOD CULTURE ID PANEL (REFLEXED) - BCID2
A.calcoaceticus-baumannii: NOT DETECTED
Bacteroides fragilis: NOT DETECTED
CTX-M ESBL: NOT DETECTED
Candida albicans: NOT DETECTED
Candida auris: NOT DETECTED
Candida glabrata: NOT DETECTED
Candida krusei: NOT DETECTED
Candida parapsilosis: NOT DETECTED
Candida tropicalis: NOT DETECTED
Carbapenem resist OXA 48 LIKE: NOT DETECTED
Carbapenem resistance IMP: NOT DETECTED
Carbapenem resistance KPC: NOT DETECTED
Carbapenem resistance NDM: NOT DETECTED
Carbapenem resistance VIM: NOT DETECTED
Cryptococcus neoformans/gattii: NOT DETECTED
Enterobacter cloacae complex: NOT DETECTED
Enterobacterales: DETECTED — AB
Enterococcus Faecium: NOT DETECTED
Enterococcus faecalis: NOT DETECTED
Escherichia coli: DETECTED — AB
Haemophilus influenzae: NOT DETECTED
Klebsiella aerogenes: NOT DETECTED
Klebsiella oxytoca: NOT DETECTED
Klebsiella pneumoniae: NOT DETECTED
Listeria monocytogenes: NOT DETECTED
Neisseria meningitidis: NOT DETECTED
Proteus species: NOT DETECTED
Pseudomonas aeruginosa: NOT DETECTED
Salmonella species: NOT DETECTED
Serratia marcescens: NOT DETECTED
Staphylococcus aureus (BCID): NOT DETECTED
Staphylococcus epidermidis: NOT DETECTED
Staphylococcus lugdunensis: NOT DETECTED
Staphylococcus species: NOT DETECTED
Stenotrophomonas maltophilia: NOT DETECTED
Streptococcus agalactiae: NOT DETECTED
Streptococcus pneumoniae: NOT DETECTED
Streptococcus pyogenes: NOT DETECTED
Streptococcus species: DETECTED — AB

## 2022-01-05 LAB — APTT
aPTT: 52 seconds — ABNORMAL HIGH (ref 24–36)
aPTT: 48 seconds — ABNORMAL HIGH (ref 24–36)
aPTT: 57 seconds — ABNORMAL HIGH (ref 24–36)
aPTT: 58 seconds — ABNORMAL HIGH (ref 24–36)

## 2022-01-05 LAB — HEPARIN LEVEL (UNFRACTIONATED)
Heparin Unfractionated: 0.34 IU/mL (ref 0.30–0.70)
Heparin Unfractionated: 0.33 IU/mL (ref 0.30–0.70)
Heparin Unfractionated: 0.37 IU/mL (ref 0.30–0.70)

## 2022-01-05 LAB — HEMOGLOBIN A1C
Hgb A1c MFr Bld: 6.6 % — ABNORMAL HIGH (ref 4.8–5.6)
Mean Plasma Glucose: 142.72 mg/dL

## 2022-01-05 LAB — HEMOGLOBIN: Hemoglobin: 8.9 g/dL — ABNORMAL LOW (ref 13.0–17.0)

## 2022-01-05 LAB — CBG MONITORING, ED: Glucose-Capillary: 161 mg/dL — ABNORMAL HIGH (ref 70–99)

## 2022-01-05 LAB — LACTIC ACID, PLASMA: Lactic Acid, Venous: 2.7 mmol/L (ref 0.5–1.9)

## 2022-01-05 LAB — MAGNESIUM: Magnesium: 1.9 mg/dL (ref 1.7–2.4)

## 2022-01-05 MED ORDER — IOHEXOL 300 MG/ML  SOLN
100.0000 mL | Freq: Once | INTRAMUSCULAR | Status: AC | PRN
  Administered 2022-01-05: 100 mL via INTRAVENOUS
  Filled 2022-01-05: qty 100

## 2022-01-05 MED ORDER — HEPARIN BOLUS VIA INFUSION
1200.0000 [IU] | Freq: Once | INTRAVENOUS | Status: AC
  Administered 2022-01-05: 1200 [IU] via INTRAVENOUS
  Filled 2022-01-05: qty 1200

## 2022-01-05 MED ORDER — SODIUM CHLORIDE 0.9 % IV SOLN
2.0000 g | INTRAVENOUS | Status: DC
  Administered 2022-01-05: 2 g via INTRAVENOUS
  Filled 2022-01-05 (×2): qty 20

## 2022-01-05 MED ORDER — SODIUM CHLORIDE 0.9 % IV BOLUS
250.0000 mL | Freq: Once | INTRAVENOUS | Status: AC
  Administered 2022-01-05: 250 mL via INTRAVENOUS

## 2022-01-05 MED ORDER — HEPARIN BOLUS VIA INFUSION
2400.0000 [IU] | Freq: Once | INTRAVENOUS | Status: AC
  Administered 2022-01-05: 2400 [IU] via INTRAVENOUS
  Filled 2022-01-05: qty 2400

## 2022-01-05 NOTE — Progress Notes (Signed)
Lab notified to collect Stat Lactic acid.

## 2022-01-05 NOTE — Progress Notes (Signed)
MD updated of continued hypotension, no additional orders placed.

## 2022-01-05 NOTE — ED Notes (Signed)
Informed RN bed assigned 

## 2022-01-05 NOTE — Progress Notes (Signed)
Second notification to lab called to collect Stat Lactic Acid.

## 2022-01-05 NOTE — Progress Notes (Signed)
PHARMACY - PHYSICIAN COMMUNICATION CRITICAL VALUE ALERT - BLOOD CULTURE IDENTIFICATION (BCID)  Steven Pham is an 61 y.o. male who presented to [facility] on 01/03/2022 with a chief complaint of chest pain.   Assessment:  1/2 bottles GNR, BCID E coli and streptococcus species, no resistance  Name of physician (or Provider) Contacted: Dr. Si Raider  Current antibiotics: None  Changes to prescribed antibiotics recommended:  Recommendations accepted by provider - start ceftriaxone 2 g IV q24h  Results for orders placed or performed during the hospital encounter of 01/03/22  Blood Culture ID Panel (Reflexed) (Collected: 01/05/2022  6:05 AM)  Result Value Ref Range   Enterococcus faecalis NOT DETECTED NOT DETECTED   Enterococcus Faecium NOT DETECTED NOT DETECTED   Listeria monocytogenes NOT DETECTED NOT DETECTED   Staphylococcus species NOT DETECTED NOT DETECTED   Staphylococcus aureus (BCID) NOT DETECTED NOT DETECTED   Staphylococcus epidermidis NOT DETECTED NOT DETECTED   Staphylococcus lugdunensis NOT DETECTED NOT DETECTED   Streptococcus species DETECTED (A) NOT DETECTED   Streptococcus agalactiae NOT DETECTED NOT DETECTED   Streptococcus pneumoniae NOT DETECTED NOT DETECTED   Streptococcus pyogenes NOT DETECTED NOT DETECTED   A.calcoaceticus-baumannii NOT DETECTED NOT DETECTED   Bacteroides fragilis NOT DETECTED NOT DETECTED   Enterobacterales DETECTED (A) NOT DETECTED   Enterobacter cloacae complex NOT DETECTED NOT DETECTED   Escherichia coli DETECTED (A) NOT DETECTED   Klebsiella aerogenes NOT DETECTED NOT DETECTED   Klebsiella oxytoca NOT DETECTED NOT DETECTED   Klebsiella pneumoniae NOT DETECTED NOT DETECTED   Proteus species NOT DETECTED NOT DETECTED   Salmonella species NOT DETECTED NOT DETECTED   Serratia marcescens NOT DETECTED NOT DETECTED   Haemophilus influenzae NOT DETECTED NOT DETECTED   Neisseria meningitidis NOT DETECTED NOT DETECTED   Pseudomonas aeruginosa NOT  DETECTED NOT DETECTED   Stenotrophomonas maltophilia NOT DETECTED NOT DETECTED   Candida albicans NOT DETECTED NOT DETECTED   Candida auris NOT DETECTED NOT DETECTED   Candida glabrata NOT DETECTED NOT DETECTED   Candida krusei NOT DETECTED NOT DETECTED   Candida parapsilosis NOT DETECTED NOT DETECTED   Candida tropicalis NOT DETECTED NOT DETECTED   Cryptococcus neoformans/gattii NOT DETECTED NOT DETECTED   CTX-M ESBL NOT DETECTED NOT DETECTED   Carbapenem resistance IMP NOT DETECTED NOT DETECTED   Carbapenem resistance KPC NOT DETECTED NOT DETECTED   Carbapenem resistance NDM NOT DETECTED NOT DETECTED   Carbapenem resist OXA 48 LIKE NOT DETECTED NOT DETECTED   Carbapenem resistance VIM NOT DETECTED NOT DETECTED    [name] 01/05/2022  5:57 PM

## 2022-01-05 NOTE — Progress Notes (Signed)
ANTICOAGULATION CONSULT NOTE  Pharmacy Consult for heparin Indication: atrial fibrillation  No Known Allergies  Patient Measurements: Height: 5\' 3"  (160 cm) Weight: 106.1 kg (234 lb) IBW/kg (Calculated) : 56.9 Heparin Dosing Weight: 81 kg  Vital Signs: Temp: 98.4 F (36.9 C) (01/06 2200) Temp Source: Oral (01/06 2200) BP: 99/44 (01/07 0800) Pulse Rate: 107 (01/07 0800)  Labs: Recent Labs    01/03/22 1248 01/03/22 1248 01/03/22 1414 01/04/22 0550 01/04/22 1330 01/05/22 0056 01/05/22 0604 01/05/22 0841  HGB 12.1*  --   --  11.0*  --   --  8.3*  --   HCT 34.6*  --   --  32.4*  --   --  24.1*  --   PLT 253  --   --  225  --   --  198  --   APTT  --    < >  --  40* 48* 52*  --  48*  LABPROT  --   --   --  15.3*  --   --   --   --   INR  --   --   --  1.2  --   --   --   --   HEPARINUNFRC  --   --   --  >1.10*  --   --   --  0.34  CREATININE  --   --  1.53* 1.52*  --   --  1.34*  --   TROPONINIHS  --   --  51* 36*  --   --   --   --    < > = values in this interval not displayed.     Estimated Creatinine Clearance: 63.5 mL/min (A) (by C-G formula based on SCr of 1.34 mg/dL (H)).   Medical History: Past Medical History:  Diagnosis Date   Arthritis    Cardiomyopathy (HCC)    a. 11/2021 Echo: EF 20-25%, glob HK. GrIII DD. Nl RV size/fxn. Mildly elev PASP. Sev dil LA, mildly dil RA. Mild to mod MR.   Gout    Hypertension    NSVT (nonsustained ventricular tachycardia) 11/2021   PSVT (paroxysmal supraventricular tachycardia) (HCC) 11/2021    Medications:  Eliquis PTA  Assessment: 61 year old male with new diagnosis of atrial fibrillation at Cardiology office this morning. He is on Eliquis for recent diagnosis of portal vein thrombosis during hospitalization in December. Pharmacy consulted for heparin management in anticipation for need of right and left cardiac catheterization.  Baseline: aPPT/HL/INR never collected   01/04/22 0550 aPTT 40 sec, HL >  1.10  SUBtherapeutic  01/04/22 1330 aPTT 48 sec,  SUBtherapeutic 01/05/22 0056 aPTT 52 sec, SUBtherapeutic  01/05/22 0841 aPTT 48 sec, HL 0.34    Goal of Therapy:  Heparin level 0.3-0.7 units/ml aPTT 66-102 seconds Monitor platelets by anticoagulation protocol: Yes   Plan:  aPTT is subtherapeutic, but heparin level is therapeutic not exactly correlating. Will increase the heparin infusion to 2300 units/hr. Recheck heparin level and aPTT in 6 hours. CBC daily while on heparin.   03/05/22, PharmD, BCPS Clinical Pharmacist   01/05/2022 9:09 AM

## 2022-01-05 NOTE — Progress Notes (Addendum)
ANTICOAGULATION CONSULT NOTE  Pharmacy Consult for heparin Indication: atrial fibrillation  No Known Allergies  Patient Measurements: Height: 5\' 3"  (160 cm) Weight: 106.1 kg (234 lb) IBW/kg (Calculated) : 56.9 Heparin Dosing Weight: 81 kg  Vital Signs: Temp: 98.3 F (36.8 C) (01/07 1526) Temp Source: Oral (01/07 1230) BP: 98/64 (01/07 1526) Pulse Rate: 79 (01/07 1526)  Labs: Recent Labs    01/03/22 1248 01/03/22 1414 01/04/22 0550 01/04/22 1330 01/05/22 0056 01/05/22 0604 01/05/22 0841 01/05/22 1255 01/05/22 1500  HGB 12.1*  --  11.0*  --   --  8.3*  --  8.9*  --   HCT 34.6*  --  32.4*  --   --  24.1*  --   --   --   PLT 253  --  225  --   --  198  --   --   --   APTT  --   --  40*   < > 52*  --  48*  --  57*  LABPROT  --   --  15.3*  --   --   --   --   --   --   INR  --   --  1.2  --   --   --   --   --   --   HEPARINUNFRC  --   --  >1.10*  --   --   --  0.34  --  0.33  CREATININE  --  1.53* 1.52*  --   --  1.34*  --   --   --   TROPONINIHS  --  51* 36*  --   --   --   --   --   --    < > = values in this interval not displayed.     Estimated Creatinine Clearance: 63.5 mL/min (A) (by C-G formula based on SCr of 1.34 mg/dL (H)).   Medical History: Past Medical History:  Diagnosis Date   Arthritis    Cardiomyopathy ([place])    a. 11/2021 Echo: EF 20-25%, glob HK. GrIII DD. Nl RV size/fxn. Mildly elev PASP. Sev dil LA, mildly dil RA. Mild to mod MR.   Gout    Hypertension    NSVT (nonsustained ventricular tachycardia) 11/2021   PSVT (paroxysmal supraventricular tachycardia) ([place]) 11/2021    Medications:  Eliquis PTA  Assessment: 61 year old male with new diagnosis of atrial fibrillation at Cardiology office this morning. He is on Eliquis for recent diagnosis of portal vein thrombosis during hospitalization in December. Pharmacy consulted for heparin management in anticipation for need of right and left cardiac catheterization.  Baseline: aPPT/HL/INR never  collected   01/04/22 0550 aPTT 40 sec, HL > 1.10  SUBtherapeutic  01/04/22 1330 aPTT 48 sec,  SUBtherapeutic 01/05/22 0056 aPTT 52 sec, SUBtherapeutic  01/05/22 0841 aPTT 48 sec, HL 0.34   01/05/22 1500 aPTT 57 sec, SUBtherapeutic  Goal of Therapy:  Heparin level 0.3-0.7 units/ml aPTT 66-102 seconds Monitor platelets by anticoagulation protocol: Yes   Plan:  aPTT is subtherapeutic, but heparin level is therapeutic not exactly correlating. Will give 1200 units bolus and increase the heparin infusion to 2450 units/hr. Recheck heparin level and aPTT in 6 hours after rate change. CBC daily while on heparin.   Sherilyn Banker, PharmD, BCPS Clinical Pharmacist   01/05/2022 3:41 PM

## 2022-01-05 NOTE — Progress Notes (Signed)
ANTICOAGULATION CONSULT NOTE  Pharmacy Consult for heparin Indication: atrial fibrillation  No Known Allergies  Patient Measurements: Height: 5\' 3"  (160 cm) Weight: 106.1 kg (234 lb) IBW/kg (Calculated) : 56.9 Heparin Dosing Weight: 81 kg  Vital Signs: Temp: 98.7 F (37.1 C) (01/07 1932) Temp Source: Oral (01/07 1932) BP: 110/61 (01/07 1932) Pulse Rate: 85 (01/07 1932)  Labs: Recent Labs    01/03/22 1248 01/03/22 1248 01/03/22 1414 01/04/22 0550 01/04/22 1330 01/05/22 0604 01/05/22 0841 01/05/22 1255 01/05/22 1500 01/05/22 2145  HGB 12.1*  --   --  11.0*  --  8.3*  --  8.9*  --   --   HCT 34.6*  --   --  32.4*  --  24.1*  --   --   --   --   PLT 253  --   --  225  --  198  --   --   --   --   APTT  --   --   --  40*   < >  --  48*  --  57* 58*  LABPROT  --   --   --  15.3*  --   --   --   --   --   --   INR  --   --   --  1.2  --   --   --   --   --   --   HEPARINUNFRC  --    < >  --  >1.10*  --   --  0.34  --  0.33 0.37  CREATININE  --   --  1.53* 1.52*  --  1.34*  --   --   --   --   TROPONINIHS  --   --  51* 36*  --   --   --   --   --   --    < > = values in this interval not displayed.     Estimated Creatinine Clearance: 63.5 mL/min (A) (by C-G formula based on SCr of 1.34 mg/dL (H)).   Medical History: Past Medical History:  Diagnosis Date   Arthritis    Cardiomyopathy (HCC)    a. 11/2021 Echo: EF 20-25%, glob HK. GrIII DD. Nl RV size/fxn. Mildly elev PASP. Sev dil LA, mildly dil RA. Mild to mod MR.   Gout    Hypertension    NSVT (nonsustained ventricular tachycardia) 11/2021   PSVT (paroxysmal supraventricular tachycardia) (HCC) 11/2021    Medications:  Eliquis PTA  Assessment: 61 year old male with new diagnosis of atrial fibrillation at Cardiology office this morning. He is on Eliquis for recent diagnosis of portal vein thrombosis during hospitalization in December. Pharmacy consulted for heparin management in anticipation for need of right  and left cardiac catheterization.  Baseline: aPPT/HL/INR never collected   01/04/22 0550 aPTT 40 sec, HL > 1.10  SUBtherapeutic  01/04/22 1330 aPTT 48 sec,  SUBtherapeutic 01/05/22 0056 aPTT 52 sec, SUBtherapeutic  01/05/22 0841 aPTT 48 sec, HL 0.34   01/05/22 1500 aPTT 57 sec, SUBtherapeutic 01/05/22 2145 aPTT 58 sec, HL 0.37 therapeutic   Goal of Therapy:  Heparin level 0.3-0.7 units/ml aPTT 66-102 seconds Monitor platelets by anticoagulation protocol: Yes   Plan:  Heparin level is therapeutic and correlating with aPTT  Continue heparin infusion to 2450 units/hr. Recheck heparin level 6 hours tp confirm  CBC daily while on heparin.   2146, PharmD, BCPS Clinical Pharmacist  01/05/2022 10:48 PM

## 2022-01-05 NOTE — Progress Notes (Signed)
PROGRESS NOTE    Steven Pham  [id number] DOB: 1961/01/12 DOA: 01/03/2022 PCP: [facility]  Outpatient Specialists: none    Brief Narrative:   63m hx htn, osa not on cpap, two recent hospital admissions first for diverticulitis where found to have systolic heart failure and again for gout flare where found to have portal vein thrombosis, sent in 1/5 from cardiology clinic for a fib with rvr.   Assessment & Plan:   Principal Problem:   Atrial fibrillation with rapid ventricular response (HCC) Active Problems:   Acute combined systolic and diastolic congestive heart failure (HCC)   Portal vein thrombosis   HTN (hypertension)  # Atrial fibrillation with rapid ventricular response Sinus rhythm restored w/ amio gtt now transitioned to oral. Hypotensive after gtt, that has improved - cardiology following, tentative plan for cath on monday - cont amio - heparin gtt - further evidence-based therapy per cardiology  # Combined systolic/diastolic heart failure Recent echo with ef 20-25%, grade 3 dd, mild/mod mitral regurg. Here appears mildly volume overload, breathing comfortably on room air - diuresis and therapeutics per cardiology  # OSA Not on cpap - treatment would be helpful  # Portal vein thrombosis Recent diagnosis. Blood cultures negative - heparin as above, revert back to eliquis when safe to do so - repeat blood cultures pending - outpt vascular surg f/u  # Anemia Hgb 8.3 today from 11 yesterday, no bleeding, benign abdomen - will repeat hgb this afternoon, monitor closely for w/w bleeding  # Recent diverticulitis No s/s recurrence - outpt GI f/u  # Elevated procalcitonin Recent admissions for diverticulitis, portal vein thrombosis. No localizing symptoms of infection. CXR clear. Covid/flu neg. 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, here cr 1.34  today - monitor  # HTN Here normotensive - monitor   DVT prophylaxis: IV heparin Code Status: full Family Communication: none @ bedside, pt says family will come today and have asked to record their contact information as none listed in our system  Level of care: progressive Status is: Inpatient  Remains inpatient appropriate because: severity of illness        Consultants:  cardiology  Procedures: none  Antimicrobials:  none    Subjective: No complaints, no chest pain or palpitations, no abd pain, no n/v/d, no dysuria  Objective: Vitals:   01/05/22 0100 01/05/22 0400 01/05/22 0430 01/05/22 0500  BP: 98/63 108/67 109/69 96/64  Pulse: 90 86 86 85  Resp: (!) 34 (!) 34 (!) 27 (!) 27  Temp:      TempSrc:      SpO2: 94% 96% 96% 96%  Weight:      Height:        Intake/Output Summary (Last 24 hours) at 01/05/2022 0838 Last data filed at 01/04/2022 2007 Gross per 24 hour  Intake 550 ml  Output --  Net 550 ml   Filed Weights   01/03/22 1200  Weight: 106.1 kg    Examination:  General exam: Appears calm and comfortable  Respiratory system: Clear to auscultation. Respiratory effort normal. Cardiovascular system: S1 & S2 heard, tachycardic, mild systolic murmur Gastrointestinal system: Abdomen is distended, soft and nontender. No organomegaly or masses felt. Normal bowel sounds heard. Central nervous system: Alert and oriented. No focal neurological deficits. Extremities: Symmetric 5 x 5 power. Skin: No rashes, lesions or ulcers. Pitting edema knees Psychiatry: Judgement and insight appear normal. Mood &  affect appropriate.     Data Reviewed: I have personally reviewed following labs and imaging studies  CBC: Recent Labs  Lab 01/03/22 1248 01/04/22 0550 01/05/22 0604  WBC 19.7* 17.1* 20.5*  HGB 12.1* 11.0* 8.3*  HCT 34.6* 32.4* 24.1*  MCV 70.0* 73.8* 71.5*  PLT 253 225 [id number]   Basic Metabolic Panel: Recent Labs  Lab 01/03/22 1414 01/04/22 0550  01/05/22 0604  NA 136 136 136  K 4.5 4.4 4.1  CL 103 105 106  CO2 23 21* 22  GLUCOSE 99 107* 109*  BUN 37* 28* 17  CREATININE 1.53* 1.52* 1.34*  CALCIUM 8.3* 8.2* 7.8*  MG 2.5* 2.2 1.9   GFR: Estimated Creatinine Clearance: 63.5 mL/min (A) (by C-G formula based on SCr of 1.34 mg/dL (H)). Liver Function Tests: No results for input(s): AST, ALT, ALKPHOS, BILITOT, PROT, ALBUMIN in the last 168 hours. No results for input(s): LIPASE, AMYLASE in the last 168 hours. No results for input(s): AMMONIA in the last 168 hours. Coagulation Profile: Recent Labs  Lab 01/04/22 0550  INR 1.2   Cardiac Enzymes: No results for input(s): CKTOTAL, CKMB, CKMBINDEX, TROPONINI in the last 168 hours. BNP (last 3 results) No results for input(s): PROBNP in the last 8760 hours. HbA1C: No results for input(s): HGBA1C in the last 72 hours. CBG: No results for input(s): GLUCAP in the last 168 hours. Lipid Profile: Recent Labs    01/04/22 0550  CHOL 226*  HDL 14*  LDLCALC 151*  TRIG 305*  CHOLHDL 16.1   Thyroid Function Tests: No results for input(s): TSH, T4TOTAL, FREET4, T3FREE, THYROIDAB in the last 72 hours. Anemia Panel: No results for input(s): VITAMINB12, FOLATE, FERRITIN, TIBC, IRON, RETICCTPCT in the last 72 hours. Urine analysis:    Component Value Date/Time   COLORURINE AMBER (A) 01/04/2022 0820   APPEARANCEUR CLEAR (A) 01/04/2022 0820   LABSPEC 1.012 01/04/2022 0820   PHURINE 5.0 01/04/2022 0820   GLUCOSEU NEGATIVE 01/04/2022 0820   HGBUR NEGATIVE 01/04/2022 0820   BILIRUBINUR NEGATIVE 01/04/2022 0820   KETONESUR NEGATIVE 01/04/2022 0820   PROTEINUR NEGATIVE 01/04/2022 0820   NITRITE NEGATIVE 01/04/2022 0820   LEUKOCYTESUR NEGATIVE 01/04/2022 0820   Sepsis Labs: @LABRCNTIP (procalcitonin:4,lacticidven:4)  ) Recent Results (from the past 240 hour(s))  Resp Panel by RT-PCR (Flu A&B, Covid) Nasopharyngeal Swab     Status: None   Collection Time: 01/03/22  9:25 AM    Specimen: Nasopharyngeal Swab; Nasopharyngeal(NP) swabs in vial transport medium  Result Value Ref Range Status   SARS Coronavirus 2 by RT PCR NEGATIVE NEGATIVE Final    Comment: (NOTE) SARS-CoV-2 target nucleic acids are NOT DETECTED.  The SARS-CoV-2 RNA is generally detectable in upper respiratory specimens during the acute phase of infection. The lowest concentration of SARS-CoV-2 viral copies this assay can detect is 138 copies/mL. A negative result does not preclude SARS-Cov-2 infection and should not be used as the sole basis for treatment or other patient management decisions. A negative result may occur with  improper specimen collection/handling, submission of specimen other than nasopharyngeal swab, presence of viral mutation(s) within the areas targeted by this assay, and inadequate number of viral copies(<138 copies/mL). A negative result must be combined with clinical observations, patient history, and epidemiological information. The expected result is Negative.  Fact Sheet for Patients:  EntrepreneurPulse.com.au  Fact Sheet for Healthcare Providers:  IncredibleEmployment.be  This test is no t yet approved or cleared by the [place] FDA and  has been authorized for detection and/or diagnosis  of SARS-CoV-2 by FDA under an Emergency Use Authorization (EUA). This EUA will remain  in effect (meaning this test can be used) for the duration of the COVID-19 declaration under Section 564(b)(1) of the Act, 21 U.S.C.section 360bbb-3(b)(1), unless the authorization is terminated  or revoked sooner.       Influenza A by PCR NEGATIVE NEGATIVE Final   Influenza B by PCR NEGATIVE NEGATIVE Final    Comment: (NOTE) The Xpert Xpress SARS-CoV-2/FLU/RSV plus assay is intended as an aid in the diagnosis of influenza from Nasopharyngeal swab specimens and should not be used as a sole basis for treatment. Nasal washings and aspirates are  unacceptable for Xpert Xpress SARS-CoV-2/FLU/RSV testing.  Fact Sheet for Patients: EntrepreneurPulse.com.au  Fact Sheet for Healthcare Providers: IncredibleEmployment.be  This test is not yet approved or cleared by the [place] FDA and has been authorized for detection and/or diagnosis of SARS-CoV-2 by FDA under an Emergency Use Authorization (EUA). This EUA will remain in effect (meaning this test can be used) for the duration of the COVID-19 declaration under Section 564(b)(1) of the Act, 21 U.S.C. section 360bbb-3(b)(1), unless the authorization is terminated or revoked.  Performed at [facility], [address], [place], [place] [address]          Radiology Studies: DG Chest 2 View  Result Date: 01/03/2022 CLINICAL DATA:  Pt sent from [facility] with a-fib RVR, pt states he has been having palpitations with intermittent chest pain for the past 3 days, denies SOB, nausea or diaphoresis. Hx. htn EXAM: CHEST - 2 VIEW COMPARISON:  12/25/2021 FINDINGS: Lungs are clear. Heart size and mediastinal contours are within normal limits. No effusion. Visualized bones unremarkable. IMPRESSION: No acute cardiopulmonary disease. Electronically Signed   By: Lucrezia Europe M.D.   On: 01/03/2022 12:47   [place] EKG SITE RITE  Result Date: 01/03/2022 If Site Rite image not attached, placement could not be confirmed due to current cardiac rhythm.       Scheduled Meds:  amiodarone  400 mg Oral BID   Continuous Infusions:  heparin 2,150 Units/hr (01/05/22 0129)     LOS: 2 days    Time spent: 61  min    Desma Maxim, MD [facility]   If 7PM-7AM, please contact night-coverage [email or web address] Password [facility] 01/05/2022, 8:38 AM

## 2022-01-05 NOTE — Progress Notes (Signed)
ANTICOAGULATION CONSULT NOTE  Pharmacy Consult for heparin Indication: atrial fibrillation  No Known Allergies  Patient Measurements: Height: 5\' 3"  (160 cm) Weight: 106.1 kg (234 lb) IBW/kg (Calculated) : 56.9 Heparin Dosing Weight: 81 kg  Vital Signs: Temp: 98.4 F (36.9 C) (01/06 2200) Temp Source: Oral (01/06 2200) BP: 98/63 (01/07 0100) Pulse Rate: 90 (01/07 0100)  Labs: Recent Labs    01/03/22 1248 01/03/22 1414 01/04/22 0550 01/04/22 1330 01/05/22 0056  HGB 12.1*  --  11.0*  --   --   HCT 34.6*  --  32.4*  --   --   PLT 253  --  225  --   --   APTT  --   --  40* 48* 52*  LABPROT  --   --  15.3*  --   --   INR  --   --  1.2  --   --   HEPARINUNFRC  --   --  >1.10*  --   --   CREATININE  --  1.53* 1.52*  --   --   TROPONINIHS  --  51* 36*  --   --      Estimated Creatinine Clearance: 56 mL/min (A) (by C-G formula based on SCr of 1.52 mg/dL (H)).   Medical History: Past Medical History:  Diagnosis Date   Arthritis    Cardiomyopathy (HCC)    a. 11/2021 Echo: EF 20-25%, glob HK. GrIII DD. Nl RV size/fxn. Mildly elev PASP. Sev dil LA, mildly dil RA. Mild to mod MR.   Gout    Hypertension    NSVT (nonsustained ventricular tachycardia) 11/2021   PSVT (paroxysmal supraventricular tachycardia) (HCC) 11/2021    Medications:  Eliquis PTA  Assessment: 61 year old male with new diagnosis of atrial fibrillation at Cardiology office this morning. He is on Eliquis for recent diagnosis of portal vein thrombosis during hospitalization in December. Pharmacy consulted for heparin management in anticipation for need of right and left cardiac catheterization.  Baseline: aPPT/HL/INR never collected   01/04/22 0550 aPTT 40 sec, HL > 1.10  SUBtherapeutic  01/04/22 1330 aPTT 48 sec,  SUBtherapeutic 01/05/22  0056 aPTT 52 sec, SUBtherapeutic   Goal of Therapy:  Heparin level 0.3-0.7 units/ml aPTT 66-102 seconds Monitor platelets by anticoagulation protocol: Yes   Plan:   Heparin (aPTT) SUBtherapeutic Give 2400 unit bolus Increase heparin infusion to 2150 units/hr Check aPTT in 6 hours following rate change  Will follow aPTT until correlation with HL Continue to monitor H&H and platelets  2151, PharmD, BCPS Clinical Pharmacist   01/05/2022 1:14 AM

## 2022-01-05 NOTE — Progress Notes (Addendum)
Progress Note  Patient Name: Steven Pham Date of Encounter: 01/05/2022  [facility] Cardiologist: Kathlyn Sacramento, MD   Subjective   HR elevated, appears to have been in possible aflutter with HR 120s. BP low s/p IVF. BP still low. Patient is asymptomatic. Appears to be back in SR.  Inpatient Medications    Scheduled Meds:  amiodarone  400 mg Oral BID   Continuous Infusions:  heparin 2,300 Units/hr (01/05/22 0928)   PRN Meds: acetaminophen, magnesium hydroxide, ondansetron (ZOFRAN) IV, traZODone   Vital Signs    Vitals:   01/05/22 0800 01/05/22 0900 01/05/22 0909 01/05/22 0926  BP: (!) 99/44 (!) 75/54 (!) 80/53 (!) 80/51  Pulse: (!) 107 98 98 97  Resp: (!) 21     Temp:      TempSrc:      SpO2: 94% 92% 93% 93%  Weight:      Height:        Intake/Output Summary (Last 24 hours) at 01/05/2022 1113 Last data filed at 01/04/2022 2007 Gross per 24 hour  Intake 550 ml  Output --  Net 550 ml   Last 3 Weights 01/03/2022 01/03/2022 12/25/2021  Weight (lbs) 234 lb 243 lb 2 oz 245 lb  Weight (kg) 106.142 kg 110.281 kg 111.131 kg      Telemetry    NSR>Aflutter>SR - Personally Reviewed  ECG    No new - Personally Reviewed  Physical Exam   GEN: No acute distress.   Neck: No JVD Cardiac: RRR, no murmurs, rubs, or gallops.  Respiratory: Clear to auscultation bilaterally. GI: Soft, nontender, non-distended  MS: No edema; No deformity. Neuro:  Nonfocal  Psych: Normal affect   Labs    High Sensitivity Troponin:   Recent Labs  Lab 12/16/21 0658 12/25/21 2244 12/26/21 0808 01/03/22 1414 01/04/22 0550  TROPONINIHS 72* 15 13 51* 36*     Chemistry Recent Labs  Lab 01/03/22 1414 01/04/22 0550 01/05/22 0604  NA 136 136 136  K 4.5 4.4 4.1  CL 103 105 106  CO2 23 21* 22  GLUCOSE 99 107* 109*  BUN 37* 28* 17  CREATININE 1.53* 1.52* 1.34*  CALCIUM 8.3* 8.2* 7.8*  MG 2.5* 2.2 1.9  GFRNONAA 52* 52* >60  ANIONGAP 10 10 8     Lipids  Recent Labs  Lab  01/04/22 0550  CHOL 226*  TRIG 305*  HDL 14*  LDLCALC 151*  CHOLHDL 16.1    Hematology Recent Labs  Lab 01/03/22 1248 01/04/22 0550 01/05/22 0604  WBC 19.7* 17.1* 20.5*  RBC 4.94 4.39 3.37*  HGB 12.1* 11.0* 8.3*  HCT 34.6* 32.4* 24.1*  MCV 70.0* 73.8* 71.5*  MCH 24.5* 25.1* 24.6*  MCHC 35.0 34.0 34.4  RDW 15.9* 16.1* 15.8*  PLT 253 225 198   Thyroid No results for input(s): TSH, FREET4 in the last 168 hours.  BNP Recent Labs  Lab 01/04/22 0550  BNP 450.2*    DDimer No results for input(s): DDIMER in the last 168 hours.   Radiology    DG Chest 2 View  Result Date: 01/03/2022 CLINICAL DATA:  Pt sent from [facility] with a-fib RVR, pt states he has been having palpitations with intermittent chest pain for the past 3 days, denies SOB, nausea or diaphoresis. Hx. htn EXAM: CHEST - 2 VIEW COMPARISON:  12/25/2021 FINDINGS: Lungs are clear. Heart size and mediastinal contours are within normal limits. No effusion. Visualized bones unremarkable. IMPRESSION: No acute cardiopulmonary disease. Electronically Signed   By: Lucrezia Europe  M.D.   On: 01/03/2022 12:47   CT ABDOMEN PELVIS W CONTRAST  Result Date: 01/05/2022 CLINICAL DATA:  History of portal vein thrombosis. EXAM: CT ABDOMEN AND PELVIS WITH CONTRAST TECHNIQUE: Multidetector CT imaging of the abdomen and pelvis was performed using the standard protocol following bolus administration of intravenous contrast. CONTRAST:  159mL OMNIPAQUE IOHEXOL 300 MG/ML  SOLN COMPARISON:  12/26/2021 FINDINGS: Lower chest: Basilar atelectasis bilaterally.  Heart is enlarged. Hepatobiliary: No suspicious focal abnormality within the liver parenchyma. Gallbladder wall is ill-defined with apparent mild pericholecystic edema. No intrahepatic or extrahepatic biliary dilation. Pancreas: No focal mass lesion. No dilatation of the main duct. No intraparenchymal cyst. No peripancreatic edema. Spleen: No splenomegaly. No focal mass lesion. Adrenals/Urinary Tract: No  adrenal nodule or mass. Right kidney unremarkable. 4.7 cm cyst lower pole left kidney is similar to prior with 13 mm low-density lower interpolar lesion, also likely a cyst No evidence for hydroureter. The urinary bladder appears normal for the degree of distention. Stomach/Bowel: Stomach is unremarkable. No gastric wall thickening. No evidence of outlet obstruction. Duodenum is normally positioned as is the ligament of Treitz. No small bowel wall thickening. No small bowel dilatation. The terminal ileum is normal. The appendix is normal. No gross colonic mass. No colonic wall thickening. Diverticular changes are noted in the left colon without evidence of diverticulitis. Vascular/Lymphatic: There is mild atherosclerotic calcification of the abdominal aorta without aneurysm. Known thrombus again identified in the main portal vein. Thrombus is identified in the superior mesenteric vein, extending into the portal splenic confluence up the portal vein and into the left and right branches of the portal venous anatomy. The patient had gas in the portal vein on the previous study but this is progressive in the interval with gas now seen in both the right and left portal vein, main portal vein, and SMV. There is some gas in small mesenteric venous anatomy of the left abdomen (51/2) although no small bowel wall thickening or evidence for pneumatosis. Central mesentery in the left abdomen is mildly congested. Reproductive: The prostate gland and seminal vesicles are unremarkable. Other: No intraperitoneal free fluid. Musculoskeletal: No worrisome lytic or sclerotic osseous abnormality. IMPRESSION: 1. Thrombus again identified in the portal vein with involvement of the superior mesenteric vein and left/right intrahepatic portal venous anatomy. Small foci of peripheral mesenteric venous gas noted in the left abdomen with interval increase in volume of portal venous gas since prior study of 12/26/2021. Portal venous gas can be  seen in the setting of bowel ischemia although no evidence for large or small bowel pneumatosis at this time and no small bowel wall thickening. Gas can also be seen in the setting of superinfection. No interloop mesenteric fluid. No intraperitoneal free fluid. 2. Ill-defined gallbladder wall with apparent mild pericholecystic edema. No biliary duct dilatation. If there is clinical concern for acute cholecystitis, right upper quadrant ultrasound could be used to further evaluate. 3. Left renal cysts. 4. Aortic Atherosclerosis (ICD10-I70.0). Electronically Signed   By: Misty Stanley M.D.   On: 01/05/2022 10:47   [place] EKG SITE RITE  Result Date: 01/03/2022 If Site Rite image not attached, placement could not be confirmed due to current cardiac rhythm.   Cardiac Studies   2D echo 12/18/2021: 1. Left ventricular ejection fraction, by estimation, is 20 to 25%. The  left ventricle has severely decreased function. The left ventricle  demonstrates global hypokinesis. The left ventricular internal cavity size  was mildly dilated. There is mild left  ventricular  hypertrophy. Left ventricular diastolic parameters are  consistent with Grade III diastolic dysfunction (restrictive).   2. Right ventricular systolic function is normal. The right ventricular  size is normal. There is mildly elevated pulmonary artery systolic  pressure.   3. Left atrial size was severely dilated.   4. Right atrial size was mildly dilated.   5. The mitral valve is normal in structure. Mild to moderate mitral valve  regurgitation. No evidence of mitral stenosis.   6. The aortic valve is normal in structure. Aortic valve regurgitation is  not visualized. No aortic stenosis is present.   7. The inferior vena cava is normal in size with greater than 50%  respiratory variability, suggesting right atrial pressure of 3 mmHg.    Patient Profile     61 y.o. male with a hx of HFrEF, HTN, obesity, anemia, and OSA who is being seen  today for the evaluation of Afib with RVR and HFrEF  Assessment & Plan    New afib/flutter with RVR - patient converted to SR with IV amiodarone - Hypotension limiting rate control medication - IV amio transitioned to oral amiodarone - Echo showed low EF, plan for R.L heart cath on Monday - CHADSVASC at least 3 - IV heparin, plan for Eliquis at discharge  HFrEF - Echo showed LVEF 20-25%,G3DD - euvolemic on exam - plan for RL heart cath Monday  Recent portal vein thrombosis in the setting of diverticulitis - IV heparin - resume Eliquis at d/c  For questions or updates, please contact [facility] Please consult [email or web address] for contact info under        Signed, Cadence Ninfa Meeker, PA-C  01/05/2022, 11:13 AM     I have seen and examined this patient with Cadence Furth.  Agree with above, note added to reflect my findings.  Patient feeling well.  No chest pain or shortness of breath.  Lying flat in bed without issue.  GEN: Well nourished, well developed, in no acute distress  HEENT: normal  Neck: no JVD, carotid bruits, or masses Cardiac: RRR; no murmurs, rubs, or gallops,no edema  Respiratory:  clear to auscultation bilaterally, normal work of breathing GI: soft, nontender, nondistended, + BS MS: no deformity or atrophy  Skin: warm and dry Neuro:  Strength and sensation are intact Psych: euthymic mood, full affect   New onset atrial fibrillation/flutter: Has fortunately converted to sinus rhythm.  We Osie Merkin continue amiodarone.  Currently on Eliquis.  CHA2DS2-VASc of 3. Acute on chronic heart failure: Unclear if ischemic in etiology.  Firm plan for right and left heart catheterization on Monday.  Euvolemic on exam, though his lactate is elevated.  It is coming down from yesterday.  If lactate continues to come down, we Renee Erb hold off on further therapy.  If it remains elevated, he may need inotropes.  Jurrell Royster M. Wayde Gopaul MD 01/05/2022 2:16 PM

## 2022-01-05 NOTE — Consult Note (Addendum)
Subjective:   CC: Portal vein thrombosis  HPI:  Steven Pham is a 61 y.o. male who was consulted by [facility] for issue above.  History of portal vein thrombosis being treated by Eliquis.  Patient has not had any complaints of abdominal pains for several days now.  Tolerating diet and his last bowel movement this morning was normal.  He is not reporting any recent bloody bowel movements.  Recently admitted for new onset A. fib with RVR being managed by cardiology and medical team.  Patient got repeat CT scan which showed increased portal venous gas so surgery consulted for possible bowel ischemia.  Past Medical History:  has a past medical history of Arthritis, Cardiomyopathy ([place]), Gout, Hypertension, NSVT (nonsustained ventricular tachycardia) (11/2021), and PSVT (paroxysmal supraventricular tachycardia) ([place]) (11/2021).  Past Surgical History: History reviewed. No pertinent surgical history.  Family History: Family history is unknown by patient.  Social History:  reports that he has never smoked. He has never used smokeless tobacco. He reports that he does not drink alcohol and does not use drugs.  Current Medications:  Prior to Admission medications   Medication Sig Start Date End Date Taking? Authorizing Provider  amLODipine (NORVASC) 5 MG tablet Take 1 tablet (5 mg total) by mouth daily. Hold until you see your cardiologist Patient not taking: Reported on 01/03/2022 12/27/21   Lorella Nimrod, MD  APIXABAN Arne [place]) VTE STARTER PACK (10MG  AND 5MG ) Take as directed on package: start with two-5mg  tablets twice daily for 7 days. On day 8, switch to one-5mg  tablet twice daily. 12/27/21   Lorella Nimrod, MD  carvedilol (COREG) 6.25 MG tablet Take 1 tablet (6.25 mg total) by mouth 2 (two) times daily with a meal. Patient not taking: Reported on 01/03/2022 12/19/21 03/19/22  Barb Merino, MD  colchicine 0.6 MG tablet Take 1 tablet (0.6 mg total) by mouth 2 (two) times daily. Patient not taking:  Reported on 01/03/2022 12/27/21   Lorella Nimrod, MD  losartan (COZAAR) 25 MG tablet Take 0.5 tablets (12.5 mg total) by mouth daily. Hold until you see your cardiologist Patient not taking: Reported on 01/03/2022 12/27/21 03/27/22  Lorella Nimrod, MD  spironolactone (ALDACTONE) 25 MG tablet Take 0.5 tablets (12.5 mg total) by mouth daily. Patient not taking: Reported on 01/03/2022 12/20/21 03/20/22  Barb Merino, MD    Allergies:  Allergies as of 01/03/2022   (No Known Allergies)    ROS:  General: Denies weight loss, weight gain, fatigue, fevers, chills, and night sweats. Eyes: Denies blurry vision, double vision, eye pain, itchy eyes, and tearing. Ears: Denies hearing loss, earache, and ringing in ears. Nose: Denies sinus pain, congestion, infections, runny nose, and nosebleeds. Mouth/throat: Denies hoarseness, sore throat, bleeding gums, and difficulty swallowing. Heart: Denies chest pain, palpitations, racing heart, leg pain or swelling, and decreased activity tolerance. Respiratory: Denies breathing difficulty, shortness of breath, wheezing, cough, and sputum. GI: Denies change in appetite, heartburn, nausea, vomiting, constipation, diarrhea, and blood in stool. GU: Denies difficulty urinating, pain with urinating, urgency, frequency, blood in urine. Musculoskeletal: Denies joint stiffness, pain, swelling, muscle weakness. Skin: Denies rash, itching, mass, tumors, sores, and boils Neurologic: Denies headache, fainting, dizziness, seizures, numbness, and tingling. Psychiatric: Denies depression, anxiety, difficulty sleeping, and memory loss. Endocrine: Denies heat or cold intolerance, and increased thirst or urination. Blood/lymph: Denies easy bruising, easy bruising, and swollen glands     Objective:     BP (!) 84/65 (BP Location: Right Arm)    Pulse 96    Temp  98.9 F (37.2 C) (Oral)    Resp (!) 21    Ht 5\' 3"  (1.6 m)    Wt 106.1 kg    SpO2 92%    BMI 41.45 kg/m   Constitutional :   alert, cooperative, appears stated age, and no distress  Lymphatics/Throat:  no asymmetry, masses, or scars  Respiratory:  clear to auscultation bilaterally  Cardiovascular:  irregularly irregular rhythm  Gastrointestinal: soft, non-tender; bowel sounds normal; no masses,  no organomegaly.   Musculoskeletal: Steady movement  Skin: Cool and moist, no surgical scars   Psychiatric: Normal affect, non-agitated, not confused       LABS:  CMP Latest Ref Rng & Units 01/05/2022 01/04/2022 01/03/2022  Glucose 70 - 99 mg/dL 109(H) 107(H) 99  BUN 6 - 20 mg/dL 17 28(H) 37(H)  Creatinine 0.61 - 1.24 mg/dL 1.34(H) 1.52(H) 1.53(H)  Sodium 135 - 145 mmol/L 136 136 136  Potassium 3.5 - 5.1 mmol/L 4.1 4.4 4.5  Chloride 98 - 111 mmol/L 106 105 103  CO2 22 - 32 mmol/L 22 21(L) 23  Calcium 8.9 - 10.3 mg/dL 7.8(L) 8.2(L) 8.3(L)  Total Protein 6.5 - 8.1 g/dL - - -  Total Bilirubin 0.3 - 1.2 mg/dL - - -  Alkaline Phos 38 - 126 U/L - - -  AST 15 - 41 U/L - - -  ALT 0 - 44 U/L - - -   CBC Latest Ref Rng & Units 01/05/2022 01/05/2022 01/04/2022  WBC 4.0 - 10.5 K/uL - 20.5(H) 17.1(H)  Hemoglobin 13.0 - 17.0 g/dL 8.9(L) 8.3(L) 11.0(L)  Hematocrit 39.0 - 52.0 % - 24.1(L) 32.4(L)  Platelets 150 - 400 K/uL - 198 225  Decreasing lactic acid from 3.8 to 2.7  RADS: CLINICAL DATA:  History of portal vein thrombosis.   EXAM: CT ABDOMEN AND PELVIS WITH CONTRAST   TECHNIQUE: Multidetector CT imaging of the abdomen and pelvis was performed using the standard protocol following bolus administration of intravenous contrast.   CONTRAST:  120mL OMNIPAQUE IOHEXOL 300 MG/ML  SOLN   COMPARISON:  12/26/2021   FINDINGS: Lower chest: Basilar atelectasis bilaterally.  Heart is enlarged.   Hepatobiliary: No suspicious focal abnormality within the liver parenchyma. Gallbladder wall is ill-defined with apparent mild pericholecystic edema. No intrahepatic or extrahepatic biliary dilation.   Pancreas: No focal mass lesion. No  dilatation of the main duct. No intraparenchymal cyst. No peripancreatic edema.   Spleen: No splenomegaly. No focal mass lesion.   Adrenals/Urinary Tract: No adrenal nodule or mass. Right kidney unremarkable. 4.7 cm cyst lower pole left kidney is similar to prior with 13 mm low-density lower interpolar lesion, also likely a cyst No evidence for hydroureter. The urinary bladder appears normal for the degree of distention.   Stomach/Bowel: Stomach is unremarkable. No gastric wall thickening. No evidence of outlet obstruction. Duodenum is normally positioned as is the ligament of Treitz. No small bowel wall thickening. No small bowel dilatation. The terminal ileum is normal. The appendix is normal. No gross colonic mass. No colonic wall thickening. Diverticular changes are noted in the left colon without evidence of diverticulitis.   Vascular/Lymphatic: There is mild atherosclerotic calcification of the abdominal aorta without aneurysm. Known thrombus again identified in the main portal vein. Thrombus is identified in the superior mesenteric vein, extending into the portal splenic confluence up the portal vein and into the left and right branches of the portal venous anatomy. The patient had gas in the portal vein on the previous study but this is  progressive in the interval with gas now seen in both the right and left portal vein, main portal vein, and SMV. There is some gas in small mesenteric venous anatomy of the left abdomen (51/2) although no small bowel wall thickening or evidence for pneumatosis. Central mesentery in the left abdomen is mildly congested.   Reproductive: The prostate gland and seminal vesicles are unremarkable.   Other: No intraperitoneal free fluid.   Musculoskeletal: No worrisome lytic or sclerotic osseous abnormality.   IMPRESSION: 1. Thrombus again identified in the portal vein with involvement of the superior mesenteric vein and left/right  intrahepatic portal venous anatomy. Small foci of peripheral mesenteric venous gas noted in the left abdomen with interval increase in volume of portal venous gas since prior study of 12/26/2021. Portal venous gas can be seen in the setting of bowel ischemia although no evidence for large or small bowel pneumatosis at this time and no small bowel wall thickening. Gas can also be seen in the setting of superinfection. No interloop mesenteric fluid. No intraperitoneal free fluid. 2. Ill-defined gallbladder wall with apparent mild pericholecystic edema. No biliary duct dilatation. If there is clinical concern for acute cholecystitis, right upper quadrant ultrasound could be used to further evaluate. 3. Left renal cysts. 4. Aortic Atherosclerosis (ICD10-I70.0).     Electronically Signed   By: Misty Stanley M.D.   On: 01/05/2022 10:47   Assessment:   Portal vein thrombosis.  CT scan above reviewed and agree with report.  Plan:   Benign abdominal exam, no abdominal pain reported for a few days, tolerating diet, and no bleeding with BM.  last BM was this am.  doubt any bowel ischemia based on lack of symptoms despite the increased portal venous gas on CT and increasing WBC today. Not sure if it is an indication of failing anticoagulation this soon since starting eliquis, so recommend opinion from vascular surgery who is following as outpt from previous hospitalization.  nothing urgently needed from surgery standpoint. Please call if he starts developing symptoms.    Further management of cardiac issues per primary team and cardiology.

## 2022-01-05 NOTE — Progress Notes (Signed)
MD at bedside. Notified of critical Lactic Acid. No additional orders placed.

## 2022-01-05 NOTE — Progress Notes (Signed)
MD notified of hypotension, fluid bolus ordered.

## 2022-01-05 NOTE — Consult Note (Signed)
Reason for Consult: Mesenteric and portal venous thrombosis with increasing intrahepatic portal venous air Referring Physician: Drs. Wouk and Davell Steven Pham is an 61 y.o. male with recent admission for atrial fibrillation and abdominal infection who was noted to have what appeared to be portal venous thrombosis last month.  He has begun on anticoagulation with apixaban.  He is recently readmitted and repeat CT scan demonstrated mild increase in portal venous air including intrahepatic.  We are consulted for question of inadequate anticoagulation HPI: Patient denies abdominal pain, food fear or change in bowel habits.  He has no abdominal tenderness.  He denies fevers chills or night sweats.  CT scan dated today is reviewed on PACS and compared to prior noncontrasted scan from mid December.  Today's scan demonstrates significant dilatation of the superior mesenteric and portal veins consistent with thrombosis.  There are locules of air in the intra-abdominal portion of a larger locule within the intrahepatic portions.  There is some mild haziness around the gallbladder but otherwise no significant findings.  The superior mesenteric and celiac arteries are widely patent on sagittal imaging.   Past Medical History:  Diagnosis Date   Arthritis    Cardiomyopathy (HCC)    a. 11/2021 Echo: EF 20-25%, glob HK. GrIII DD. Nl RV size/fxn. Mildly elev PASP. Sev dil LA, mildly dil RA. Mild to mod MR.   Gout    Hypertension    NSVT (nonsustained ventricular tachycardia) 11/2021   PSVT (paroxysmal supraventricular tachycardia) (HCC) 11/2021    History reviewed. No pertinent surgical history.  Family History  Family history unknown: Yes    Social History:  reports that he has never smoked. He has never used smokeless tobacco. He reports that he does not drink alcohol and does not use drugs.  Allergies: No Known Allergies  Medications: I have reviewed the patient's current medications.  Results for  orders placed or performed during the hospital encounter of 01/03/22 (from the past 48 hour(s))  Magnesium     Status: None   Collection Time: 01/04/22  5:50 AM  Result Value Ref Range   Magnesium 2.2 1.7 - 2.4 mg/dL    Comment: Performed at [facility], [address] [place], [place] [address]  Basic metabolic panel     Status: Abnormal   Collection Time: 01/04/22  5:50 AM  Result Value Ref Range   Sodium 136 135 - 145 mmol/L   Potassium 4.4 3.5 - 5.1 mmol/L   Chloride 105 98 - 111 mmol/L   CO2 21 (L) 22 - 32 mmol/L   Glucose, Bld 107 (H) 70 - 99 mg/dL    Comment: Glucose reference range applies only to samples taken after fasting for at least 8 hours.   BUN 28 (H) 6 - 20 mg/dL   Creatinine, Ser 5.57 (H) 0.61 - 1.24 mg/dL   Calcium 8.2 (L) 8.9 - 10.3 mg/dL   GFR, Estimated 52 (L) >60 mL/min    Comment: (NOTE) Calculated using the CKD-EPI Creatinine Equation (2021)    Anion gap 10 5 - 15    Comment: Performed at [facility], [address] [place], [place] [address]  Lipid panel     Status: Abnormal   Collection Time: 01/04/22  5:50 AM  Result Value Ref Range   Cholesterol 226 (H) 0 - 200 mg/dL   Triglycerides 542 (H) <150 mg/dL   HDL 14 (L) >70 mg/dL   Total CHOL/HDL Ratio 16.1 RATIO   VLDL 61 (H) 0 -  40 mg/dL   LDL Cholesterol 161 (H) 0 - 99 mg/dL    Comment:        Total Cholesterol/HDL:CHD Risk Coronary Heart Disease Risk Table                     Men   Women  1/2 Average Risk   3.4   3.3  Average Risk       5.0   4.4  2 X Average Risk   9.6   7.1  3 X Average Risk  23.4   11.0        Use the calculated Patient Ratio above and the CHD Risk Table to determine the patient's CHD Risk.        ATP III CLASSIFICATION (LDL):  <100     mg/dL   Optimal  096-045  mg/dL   Near or Above                    Optimal  130-159  mg/dL   Borderline  409-811  mg/dL   High  >914     mg/dL   Very High Performed at [facility], [address] [place], [place] [address]   CBC     Status: Abnormal   Collection Time: 01/04/22  5:50 AM  Result Value Ref Range   WBC 17.1 (H) 4.0 - 10.5 K/uL   RBC 4.39 4.22 - 5.81 MIL/uL   Hemoglobin 11.0 (L) 13.0 - 17.0 g/dL   HCT 62.1 (L) 30.8 - 65.7 %   MCV 73.8 (L) 80.0 - 100.0 fL   MCH 25.1 (L) 26.0 - 34.0 pg   MCHC 34.0 30.0 - 36.0 g/dL   RDW 84.6 (H) 96.2 - 95.2 %   Platelets 225 150 - 400 K/uL   nRBC 0.0 0.0 - 0.2 %    Comment: Performed at [facility], [address] [place], [place] [address]  APTT     Status: Abnormal   Collection Time: 01/04/22  5:50 AM  Result Value Ref Range   aPTT 40 (H) 24 - 36 seconds    Comment:        IF BASELINE aPTT IS ELEVATED, SUGGEST PATIENT RISK ASSESSMENT BE USED TO DETERMINE APPROPRIATE ANTICOAGULANT THERAPY. Performed at [facility], [address] [place], [place] [address]   Heparin level (unfractionated)     Status: Abnormal   Collection Time: 01/04/22  5:50 AM  Result Value Ref Range   Heparin Unfractionated >1.10 (H) 0.30 - 0.70 IU/mL    Comment: (NOTE) The clinical reportable range upper limit is being lowered to >1.10 to align with the FDA approved guidance for the current laboratory assay.  If heparin results are below expected values, and patient dosage has  been confirmed, suggest follow up testing of antithrombin III levels. Performed at [facility], [address] [place], [place] [address]   Lactic acid, plasma     Status: Abnormal   Collection Time: 01/04/22  5:50 AM  Result Value Ref Range   Lactic Acid, Venous 3.8 (HH) 0.5 - 1.9 mmol/L    Comment: CRITICAL RESULT CALLED TO, READ BACK BY AND VERIFIED WITH CRYSTAL BLACK  01/04/22 MJU Performed at [facility], [address] [place], [place] [address]   Protime-INR     Status: Abnormal   Collection Time: 01/04/22  5:50 AM  Result Value Ref Range   Prothrombin Time 15.3 (H) 11.4 - 15.2 seconds  INR 1.2 0.8 - 1.2     Comment: (NOTE) INR goal varies based on device and disease states. Performed at [facility], [address] [place], [place] [address]   Brain natriuretic peptide     Status: Abnormal   Collection Time: 01/04/22  5:50 AM  Result Value Ref Range   B Natriuretic Peptide 450.2 (H) 0.0 - 100.0 pg/mL    Comment: Performed at [facility], [address] [place], [place] [address]  Troponin I (High Sensitivity)     Status: Abnormal   Collection Time: 01/04/22  5:50 AM  Result Value Ref Range   Troponin I (High Sensitivity) 36 (H) <18 ng/L    Comment: (NOTE) Elevated high sensitivity troponin I (hsTnI) values and significant  changes across serial measurements may suggest ACS but many other  chronic and acute conditions are known to elevate hsTnI results.  Refer to the "Links" section for chest pain algorithms and additional  guidance. Performed at [facility], [address] [place], [place] [address]   Urinalysis, Complete w Microscopic     Status: Abnormal   Collection Time: 01/04/22  8:20 AM  Result Value Ref Range   Color, Urine AMBER (A) YELLOW    Comment: BIOCHEMICALS MAY BE AFFECTED BY COLOR   APPearance CLEAR (A) CLEAR   Specific Gravity, Urine 1.012 1.005 - 1.030   pH 5.0 5.0 - 8.0   Glucose, UA NEGATIVE NEGATIVE mg/dL   Hgb urine dipstick NEGATIVE NEGATIVE   Bilirubin Urine NEGATIVE NEGATIVE   Ketones, ur NEGATIVE NEGATIVE mg/dL   Protein, ur NEGATIVE NEGATIVE mg/dL   Nitrite NEGATIVE NEGATIVE   Leukocytes,Ua NEGATIVE NEGATIVE   RBC / HPF 0-5 0 - 5 RBC/hpf   WBC, UA 0-5 0 - 5 WBC/hpf   Bacteria, UA RARE (A) NONE SEEN   Squamous Epithelial / LPF 0-5 0 - 5   Mucus PRESENT    Hyaline Casts, UA PRESENT     Comment: Performed at [facility], [address] [place], [place] [address]  Urine Drug Screen, Qualitative ([facility] only)     Status: None   Collection Time: 01/04/22  8:20 AM  Result Value Ref Range   Tricyclic, Ur  Screen NONE DETECTED NONE DETECTED   Amphetamines, Ur Screen NONE DETECTED NONE DETECTED   MDMA (Ecstasy)Ur Screen NONE DETECTED NONE DETECTED   Cocaine Metabolite,Ur [place] NONE DETECTED NONE DETECTED   Opiate, Ur Screen NONE DETECTED NONE DETECTED   Phencyclidine (PCP) Ur S NONE DETECTED NONE DETECTED   Cannabinoid 50 Ng, Ur  NONE DETECTED NONE DETECTED   Barbiturates, Ur Screen NONE DETECTED NONE DETECTED   Benzodiazepine, Ur Scrn NONE DETECTED NONE DETECTED   Methadone Scn, Ur NONE DETECTED NONE DETECTED    Comment: (NOTE) Tricyclics + metabolites, urine    Cutoff 1000 ng/mL Amphetamines + metabolites, urine  Cutoff 1000 ng/mL MDMA (Ecstasy), urine              Cutoff 500 ng/mL Cocaine Metabolite, urine          Cutoff 300 ng/mL Opiate + metabolites, urine        Cutoff 300 ng/mL Phencyclidine (PCP), urine         Cutoff 25 ng/mL Cannabinoid, urine                 Cutoff 50 ng/mL Barbiturates + metabolites, urine  Cutoff 200 ng/mL Benzodiazepine, urine  Cutoff 200 ng/mL Methadone, urine                   Cutoff 300 ng/mL  The urine drug screen provides only a preliminary, unconfirmed analytical test result and should not be used for non-medical purposes. Clinical consideration and professional judgment should be applied to any positive drug screen result due to possible interfering substances. A more specific alternate chemical method must be used in order to obtain a confirmed analytical result. Gas chromatography / mass spectrometry (GC/MS) is the preferred confirm atory method. Performed at [facility], [address] [place], [place] [address]   APTT     Status: Abnormal   Collection Time: 01/04/22  1:30 PM  Result Value Ref Range   aPTT 48 (H) 24 - 36 seconds    Comment:        IF BASELINE aPTT IS ELEVATED, SUGGEST PATIENT RISK ASSESSMENT BE USED TO DETERMINE APPROPRIATE ANTICOAGULANT THERAPY. Performed at [facility], [address] [place], [place] [address]   APTT     Status: Abnormal   Collection Time: 01/05/22 12:56 AM  Result Value Ref Range   aPTT 52 (H) 24 - 36 seconds    Comment:        IF BASELINE aPTT IS ELEVATED, SUGGEST PATIENT RISK ASSESSMENT BE USED TO DETERMINE APPROPRIATE ANTICOAGULANT THERAPY. Performed at [facility], [address] [place], [place] [address]   Basic metabolic panel     Status: Abnormal   Collection Time: 01/05/22  6:04 AM  Result Value Ref Range   Sodium 136 135 - 145 mmol/L   Potassium 4.1 3.5 - 5.1 mmol/L   Chloride 106 98 - 111 mmol/L   CO2 22 22 - 32 mmol/L   Glucose, Bld 109 (H) 70 - 99 mg/dL    Comment: Glucose reference range applies only to samples taken after fasting for at least 8 hours.   BUN 17 6 - 20 mg/dL   Creatinine, Ser 9.14 (H) 0.61 - 1.24 mg/dL   Calcium 7.8 (L) 8.9 - 10.3 mg/dL   GFR, Estimated >78 >29 mL/min    Comment: (NOTE) Calculated using the CKD-EPI Creatinine Equation (2021)    Anion gap 8 5 - 15    Comment: Performed at [facility], [address] [place], [place] [address]  CBC     Status: Abnormal   Collection Time: 01/05/22  6:04 AM  Result Value Ref Range   WBC 20.5 (H) 4.0 - 10.5 K/uL   RBC 3.37 (L) 4.22 - 5.81 MIL/uL   Hemoglobin 8.3 (L) 13.0 - 17.0 g/dL    Comment: Reticulocyte Hemoglobin testing may be clinically indicated, consider ordering this additional test [id number]    HCT 24.1 (L) 39.0 - 52.0 %   MCV 71.5 (L) 80.0 - 100.0 fL   MCH 24.6 (L) 26.0 - 34.0 pg   MCHC 34.4 30.0 - 36.0 g/dL   RDW 96.2 (H) 95.2 - 84.1 %   Platelets 198 150 - 400 K/uL   nRBC 0.0 0.0 - 0.2 %    Comment: Performed at [facility], [address] [place], [place] [address]  Magnesium     Status: None   Collection Time: 01/05/22  6:04 AM  Result Value Ref Range   Magnesium 1.9 1.7 - 2.4 mg/dL    Comment: Performed at [facility], [address] [place], [place] [address]  Heparin level  (unfractionated)     Status:  None   Collection Time: 01/05/22  8:41 AM  Result Value Ref Range   Heparin Unfractionated 0.34 0.30 - 0.70 IU/mL    Comment: (NOTE) The clinical reportable range upper limit is being lowered to >1.10 to align with the FDA approved guidance for the current laboratory assay.  If heparin results are below expected values, and patient dosage has  been confirmed, suggest follow up testing of antithrombin III levels. Performed at [facility], [address] [place], [place] [address]   APTT     Status: Abnormal   Collection Time: 01/05/22  8:41 AM  Result Value Ref Range   aPTT 48 (H) 24 - 36 seconds    Comment:        IF BASELINE aPTT IS ELEVATED, SUGGEST PATIENT RISK ASSESSMENT BE USED TO DETERMINE APPROPRIATE ANTICOAGULANT THERAPY. Performed at [facility], [address] [place], [place] [address]   Hemoglobin     Status: Abnormal   Collection Time: 01/05/22 12:55 PM  Result Value Ref Range   Hemoglobin 8.9 (L) 13.0 - 17.0 g/dL    Comment: Performed at [facility], [address] [place], [place] [address]  Lactic acid, plasma     Status: Abnormal   Collection Time: 01/05/22 12:55 PM  Result Value Ref Range   Lactic Acid, Venous 2.7 (HH) 0.5 - 1.9 mmol/L    Comment: CRITICAL RESULT CALLED TO, READ BACK BY AND VERIFIED WITH NIKKI SOLOMON 01/05/22 @ 1324 BY SB Performed at [facility], [address] [place], [place] [address]   CBG monitoring, ED     Status: Abnormal   Collection Time: 01/05/22 12:56 PM  Result Value Ref Range   Glucose-Capillary 161 (H) 70 - 99 mg/dL    Comment: Glucose reference range applies only to samples taken after fasting for at least 8 hours.    CT ABDOMEN PELVIS W CONTRAST  Result Date: 01/05/2022 CLINICAL DATA:  History of portal vein thrombosis. EXAM: CT ABDOMEN AND PELVIS WITH CONTRAST TECHNIQUE: Multidetector CT imaging of the abdomen and pelvis was performed using the  standard protocol following bolus administration of intravenous contrast. CONTRAST:  OMNIPAQUE IOHEXOL 300 MG/ML  SOLN COMPARISON:  12/26/2021 FINDINGS: Lower chest: Basilar atelectasis bilaterally.  Heart is enlarged. Hepatobiliary: No suspicious focal abnormality within the liver parenchyma. Gallbladder wall is ill-defined with apparent mild pericholecystic edema. No intrahepatic or extrahepatic biliary dilation. Pancreas: No focal mass lesion. No dilatation of the main duct. No intraparenchymal cyst. No peripancreatic edema. Spleen: No splenomegaly. No focal mass lesion. Adrenals/Urinary Tract: No adrenal nodule or mass. Right kidney unremarkable. 4.7 cm cyst lower pole left kidney is similar to prior with 13 mm low-density lower interpolar lesion, also likely a cyst No evidence for hydroureter. The urinary bladder appears normal for the degree of distention. Stomach/Bowel: Stomach is unremarkable. No gastric wall thickening. No evidence of outlet obstruction. Duodenum is normally positioned as is the ligament of Treitz. No small bowel wall thickening. No small bowel dilatation. The terminal ileum is normal. The appendix is normal. No gross colonic mass. No colonic wall thickening. Diverticular changes are noted in the left colon without evidence of diverticulitis. Vascular/Lymphatic: There is mild atherosclerotic calcification of the abdominal aorta without aneurysm. Known thrombus again identified in the main portal vein. Thrombus is identified in the superior mesenteric vein, extending into the portal splenic confluence up the portal vein and into the left and right branches of the portal venous anatomy. The patient had gas  in the portal vein on the previous study but this is progressive in the interval with gas now seen in both the right and left portal vein, main portal vein, and SMV. There is some gas in small mesenteric venous anatomy of the left abdomen (51/2) although no small bowel wall thickening  or evidence for pneumatosis. Central mesentery in the left abdomen is mildly congested. Reproductive: The prostate gland and seminal vesicles are unremarkable. Other: No intraperitoneal free fluid. Musculoskeletal: No worrisome lytic or sclerotic osseous abnormality. IMPRESSION: 1. Thrombus again identified in the portal vein with involvement of the superior mesenteric vein and left/right intrahepatic portal venous anatomy. Small foci of peripheral mesenteric venous gas noted in the left abdomen with interval increase in volume of portal venous gas since prior study of 12/26/2021. Portal venous gas can be seen in the setting of bowel ischemia although no evidence for large or small bowel pneumatosis at this time and no small bowel wall thickening. Gas can also be seen in the setting of superinfection. No interloop mesenteric fluid. No intraperitoneal free fluid. 2. Ill-defined gallbladder wall with apparent mild pericholecystic edema. No biliary duct dilatation. If there is clinical concern for acute cholecystitis, right upper quadrant ultrasound could be used to further evaluate. 3. Left renal cysts. 4. Aortic Atherosclerosis (ICD10-I70.0). Electronically Signed   By: Kennith Center M.D.   On: 01/05/2022 10:47   [place] EKG SITE RITE  Result Date: 01/03/2022 If Site Rite image not attached, placement could not be confirmed due to current cardiac rhythm.   Review of Systems  Constitutional:  Negative for appetite change, chills and fever.  Gastrointestinal:  Negative for abdominal distention, abdominal pain, blood in stool, diarrhea, nausea and vomiting.  Endocrine: Negative for polydipsia and polyphagia.  Musculoskeletal:  Negative for arthralgias and myalgias.  All other systems reviewed and are negative. Blood pressure 98/64, pulse 79, temperature 98.3 F (36.8 C), resp. rate 18, height  (1.6 m), weight 106.1 kg, SpO2 94 %. Physical Exam Constitutional:      Appearance: He is obese.  HENT:      Head: Normocephalic and atraumatic.  Cardiovascular:     Rate and Rhythm: Regular rhythm.  Pulmonary:     Effort: Pulmonary effort is normal.  Abdominal:     General: There is no distension.     Palpations: Abdomen is soft. There is no mass.     Tenderness: There is no abdominal tenderness. There is no guarding or rebound.  Skin:    General: Skin is warm and dry.  Neurological:     Mental Status: He is alert.  Psychiatric:        Mood and Affect: Mood normal.    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  Suspect that his prior episode of presumed small bowel diverticulitis is the culprit.  He has been treated with appropriate anticoagulation with apixaban.  On today's scan it is unclear whether thrombosis has extended because prior scan was performed without contrast.  There clearly is a bit more portal venous air but he remains completely asymptomatic from an abdominal standpoint and there is no evidence of ongoing arterial or venous mesenteric insufficiency  Agree with hydration and continue general diet.  Given the inability to adequately monitor apixaban agree with changing to a parenteral anticoagulation at least in the short-term with unfractionated heparin drip as has been done.  This can be changed to a cutaneous low molecular weight heparin with anti Xa level monitoring  to assure therapeutic response.  I had a long discussion with the patient concerning findings.  Clinically he is doing quite well despite the CT scan findings.  We will continue to be available as necessary.  Verda Cumins 01/05/2022, 3:31 PM

## 2022-01-06 ENCOUNTER — Inpatient Hospital Stay (HOSPITAL_COMMUNITY)
Admit: 2022-01-06 | Discharge: 2022-01-06 | Disposition: A | Discharge status: Home / Self Care | Payer: BC Managed Care – PPO | Attending: Obstetrics and Gynecology | Admitting: Obstetrics and Gynecology

## 2022-01-06 DIAGNOSIS — R7881 Bacteremia: Secondary | ICD-10-CM | POA: Diagnosis not present

## 2022-01-06 LAB — CBC
HCT: 23 % — ABNORMAL LOW (ref 39.0–52.0)
Hemoglobin: 8.1 g/dL — ABNORMAL LOW (ref 13.0–17.0)
MCH: 24.8 pg — ABNORMAL LOW (ref 26.0–34.0)
MCHC: 35.2 g/dL (ref 30.0–36.0)
MCV: 70.6 fL — ABNORMAL LOW (ref 80.0–100.0)
Platelets: 216 10*3/uL (ref 150–400)
RBC: 3.26 MIL/uL — ABNORMAL LOW (ref 4.22–5.81)
RDW: 15.8 % — ABNORMAL HIGH (ref 11.5–15.5)
WBC: 19.1 10*3/uL — ABNORMAL HIGH (ref 4.0–10.5)
nRBC: 0 % (ref 0.0–0.2)

## 2022-01-06 LAB — ECHOCARDIOGRAM COMPLETE
AR max vel: 1.83 cm2
AV Peak grad: 9.1 mmHg
Ao pk vel: 1.51 m/s
Area-P 1/2: 4.33 cm2
Calc EF: 44.8 %
Height: 63 in
S' Lateral: 4.75 cm
Single Plane A2C EF: 42.1 %
Single Plane A4C EF: 49.2 %
Weight: 3744 oz

## 2022-01-06 LAB — BASIC METABOLIC PANEL
Anion gap: 7 (ref 5–15)
BUN: 15 mg/dL (ref 6–20)
CO2: 21 mmol/L — ABNORMAL LOW (ref 22–32)
Calcium: 7.7 mg/dL — ABNORMAL LOW (ref 8.9–10.3)
Chloride: 106 mmol/L (ref 98–111)
Creatinine, Ser: 1.37 mg/dL — ABNORMAL HIGH (ref 0.61–1.24)
GFR, Estimated: 59 mL/min — ABNORMAL LOW (ref >60)
Glucose, Bld: 117 mg/dL — ABNORMAL HIGH (ref 70–99)
Potassium: 4 mmol/L (ref 3.5–5.1)
Sodium: 134 mmol/L — ABNORMAL LOW (ref 135–145)

## 2022-01-06 LAB — HEPARIN LEVEL (UNFRACTIONATED)
Heparin Unfractionated: 0.3 IU/mL (ref 0.30–0.70)
Heparin Unfractionated: 0.23 IU/mL — ABNORMAL LOW (ref 0.30–0.70)
Heparin Unfractionated: 0.16 IU/mL — ABNORMAL LOW (ref 0.30–0.70)

## 2022-01-06 LAB — LACTIC ACID, PLASMA: Lactic Acid, Venous: 1.1 mmol/L (ref 0.5–1.9)

## 2022-01-06 LAB — HCV AB W REFLEX TO QUANT PCR: HCV Ab: <0.1 s/co ratio (ref 0.0–0.9)

## 2022-01-06 LAB — MAGNESIUM: Magnesium: 1.9 mg/dL (ref 1.7–2.4)

## 2022-01-06 LAB — HCV INTERPRETATION

## 2022-01-06 MED ORDER — ACETAMINOPHEN 325 MG PO TABS
650.0000 mg | ORAL_TABLET | ORAL | Status: DC | PRN
  Administered 2022-01-07 (×2): 650 mg via ORAL
  Filled 2022-01-06 (×2): qty 2

## 2022-01-06 MED ORDER — PIPERACILLIN-TAZOBACTAM 3.375 G IVPB
3.3750 g | Freq: Three times a day (TID) | INTRAVENOUS | Status: DC
  Administered 2022-01-06 – 2022-01-08 (×6): 3.375 g via INTRAVENOUS
  Filled 2022-01-06 (×2): qty 50

## 2022-01-06 MED ORDER — HEPARIN BOLUS VIA INFUSION
1200.0000 [IU] | Freq: Once | INTRAVENOUS | Status: AC
  Administered 2022-01-06: 1200 [IU] via INTRAVENOUS
  Filled 2022-01-06: qty 1200

## 2022-01-06 MED ORDER — PERFLUTREN LIPID MICROSPHERE
1.0000 mL | INTRAVENOUS | Status: AC | PRN
  Administered 2022-01-06: 4 mL via INTRAVENOUS
  Filled 2022-01-06: qty 10

## 2022-01-06 MED ORDER — HEPARIN BOLUS VIA INFUSION
2400.0000 [IU] | Freq: Once | INTRAVENOUS | Status: AC
  Administered 2022-01-06: 2400 [IU] via INTRAVENOUS
  Filled 2022-01-06: qty 2400

## 2022-01-06 NOTE — Progress Notes (Addendum)
ANTICOAGULATION CONSULT NOTE  Pharmacy Consult for heparin Indication: atrial fibrillation  No Known Allergies  Patient Measurements: Height: 5\' 3"  (160 cm) Weight: 106.1 kg (234 lb) IBW/kg (Calculated) : 56.9 Heparin Dosing Weight: 81 kg  Vital Signs: Temp: 100.4 F (38 C) (01/08 0433) Temp Source: Oral (01/07 2334) BP: 94/60 (01/08 0433) Pulse Rate: 88 (01/08 0433)  Labs: Recent Labs    01/03/22 1248 01/03/22 1414 01/04/22 0550 01/04/22 1330 01/05/22 0604 01/05/22 0841 01/05/22 1255 01/05/22 1500 01/05/22 2145 01/06/22 0436  HGB  --   --  11.0*  --  8.3*  --  8.9*  --   --  8.1*  HCT  --   --  32.4*  --  24.1*  --   --   --   --  23.0*  PLT  --   --  225  --  198  --   --   --   --  216  APTT  --   --  40*   < >  --  48*  --  57* 58*  --   LABPROT  --   --  15.3*  --   --   --   --   --   --   --   INR  --   --  1.2  --   --   --   --   --   --   --   HEPARINUNFRC   < >  --  >1.10*  --   --  0.34  --  0.33 0.37 0.30  CREATININE  --  1.53* 1.52*  --  1.34*  --   --   --   --  1.37*  TROPONINIHS  --  51* 36*  --   --   --   --   --   --   --    < > = values in this interval not displayed.     Estimated Creatinine Clearance: 62.1 mL/min (A) (by C-G formula based on SCr of 1.37 mg/dL (H)).   Medical History: Past Medical History:  Diagnosis Date   Arthritis    Cardiomyopathy (HCC)    a. 11/2021 Echo: EF 20-25%, glob HK. GrIII DD. Nl RV size/fxn. Mildly elev PASP. Sev dil LA, mildly dil RA. Mild to mod MR.   Gout    Hypertension    NSVT (nonsustained ventricular tachycardia) 11/2021   PSVT (paroxysmal supraventricular tachycardia) (HCC) 11/2021    Medications:  Eliquis PTA  Assessment: 61 year old male with new diagnosis of atrial fibrillation at Cardiology office this morning. He is on Eliquis for recent diagnosis of portal vein thrombosis during hospitalization in December. Pharmacy consulted for heparin management in anticipation for need of right and  left cardiac catheterization.  Baseline: aPPT/HL/INR never collected   01/04/22 0550 aPTT 40 sec, HL > 1.10  SUBtherapeutic  01/04/22 1330 aPTT 48 sec,  SUBtherapeutic 01/05/22 0056 aPTT 52 sec, SUBtherapeutic  01/05/22 0841 aPTT 48 sec, HL 0.34   01/05/22 1500 aPTT 57 sec, SUBtherapeutic 01/05/22 2145 aPTT 58 sec, HL 0.37 therapeutic  01/06/22 0436 HL 0.30 Hgb 11.0 >> 8.1  Goal of Therapy:  Heparin level 0.3-0.7 units/ml aPTT 66-102 seconds Monitor platelets by anticoagulation protocol: Yes   Plan:  Heparin level is therapeutic x 2, however on trending down to lower limit of goal Will slightly increase heparin infusion to 2550 units/hr. Recheck heparin level 6 hours  CBC daily while on heparin, Hbg  trending down, CTM    Sharen Hones, PharmD, BCPS Clinical Pharmacist   01/06/2022 6:05 AM

## 2022-01-06 NOTE — Progress Notes (Signed)
ANTICOAGULATION CONSULT NOTE  Pharmacy Consult for heparin Indication: atrial fibrillation  No Known Allergies  Patient Measurements: Height: 5\' 3"  (160 cm) Weight: 106.1 kg (234 lb) IBW/kg (Calculated) : 56.9 Heparin Dosing Weight: 81 kg  Vital Signs: Temp: 100.9 F (38.3 C) (01/08 2030) Temp Source: Oral (01/08 2030) BP: 98/57 (01/08 2030) Pulse Rate: 84 (01/08 2030)  Labs: Recent Labs    01/04/22 0550 01/04/22 1330 01/05/22 0604 01/05/22 0841 01/05/22 1255 01/05/22 1500 01/05/22 2145 01/06/22 0436 01/06/22 1334 01/06/22 2113  HGB 11.0*  --  8.3*  --  8.9*  --   --  8.1*  --   --   HCT 32.4*  --  24.1*  --   --   --   --  23.0*  --   --   PLT 225  --  198  --   --   --   --  216  --   --   APTT 40*   < >  --  48*  --  57* 58*  --   --   --   LABPROT 15.3*  --   --   --   --   --   --   --   --   --   INR 1.2  --   --   --   --   --   --   --   --   --   HEPARINUNFRC >1.10*  --   --  0.34  --  0.33 0.37 0.30 0.23* 0.16*  CREATININE 1.52*  --  1.34*  --   --   --   --  1.37*  --   --   TROPONINIHS 36*  --   --   --   --   --   --   --   --   --    < > = values in this interval not displayed.     Estimated Creatinine Clearance: 62.1 mL/min (A) (by C-G formula based on SCr of 1.37 mg/dL (H)).   Medical History: Past Medical History:  Diagnosis Date   Arthritis    Cardiomyopathy (HCC)    a. 11/2021 Echo: EF 20-25%, glob HK. GrIII DD. Nl RV size/fxn. Mildly elev PASP. Sev dil LA, mildly dil RA. Mild to mod MR.   Gout    Hypertension    NSVT (nonsustained ventricular tachycardia) 11/2021   PSVT (paroxysmal supraventricular tachycardia) (HCC) 11/2021    Medications:  Eliquis PTA  Assessment: 61 year old male with new diagnosis of atrial fibrillation at Cardiology office this morning. He is on Eliquis for recent diagnosis of portal vein thrombosis during hospitalization in December. Pharmacy consulted for heparin management in anticipation for need of right  and left cardiac catheterization.  Baseline: aPPT/HL/INR never collected   01/04/22 0550 aPTT 40 sec, HL > 1.10  SUBtherapeutic  01/04/22 1330 aPTT 48 sec,  SUBtherapeutic 01/05/22 0056 aPTT 52 sec, SUBtherapeutic  01/05/22 0841 aPTT 48 sec, HL 0.34   01/05/22 1500 aPTT 57 sec, SUBtherapeutic 01/05/22 2145 aPTT 58 sec, HL 0.37 therapeutic  01/06/22 0436 HL 0.30 Hgb 11.0 >> 8.1 01/06/22 1334 HL 0.23  01/06/22 2113 HL 0.16   Goal of Therapy:  Heparin level 0.3-0.7 units/ml aPTT 66-102 seconds Monitor platelets by anticoagulation protocol: Yes   Plan:  Heparin level is subtherapeutic. Will give  heparin bolus 2400 units x 1 and increase heparin infusion to 3000 units/hr. Recheck heparin level in 6 hours.  CBC daily while on heparin.    Sharen Hones, PharmD, BCPS Clinical Pharmacist   01/06/2022 10:30 PM

## 2022-01-06 NOTE — Progress Notes (Signed)
Progress Note  Patient Name: Steven Pham Date of Encounter: 01/06/2022  [facility] Cardiologist: Kathlyn Sacramento, MD   Subjective   Well today.  No chest pain or shortness of breath.  Ready for possible left heart catheterization tomorrow.  Inpatient Medications    Scheduled Meds:  amiodarone  400 mg Oral BID   Continuous Infusions:  cefTRIAXone (ROCEPHIN)  IV 2 g (01/05/22 2017)   heparin 2,550 Units/hr (01/06/22 0748)   PRN Meds: acetaminophen, magnesium hydroxide, ondansetron (ZOFRAN) IV, traZODone   Vital Signs    Vitals:   01/05/22 2334 01/06/22 0433 01/06/22 0754 01/06/22 1003  BP: 98/66 94/60 93/63  96/65  Pulse: 85 88 77 81  Resp: 19 18 18    Temp: 98.8 F (37.1 C) (!) 100.4 F (38 C) 99 F (37.2 C)   TempSrc: Oral  Oral   SpO2: 98% 97% 97% 98%  Weight:      Height:        Intake/Output Summary (Last 24 hours) at 01/06/2022 1127 Last data filed at 01/06/2022 1016 Gross per 24 hour  Intake 1678.2 ml  Output --  Net 1678.2 ml    Last 3 Weights 01/03/2022 01/03/2022 12/25/2021  Weight (lbs) 234 lb 243 lb 2 oz 245 lb  Weight (kg) 106.142 kg 110.281 kg 111.131 kg      Telemetry    Sinus rhythm-personally reviewed  ECG    No new  Physical Exam   GEN: Well nourished, well developed, in no acute distress  HEENT: normal  Neck: no JVD, carotid bruits, or masses Cardiac: RRR; no murmurs, rubs, or gallops,no edema  Respiratory:  clear to auscultation bilaterally, normal work of breathing GI: soft, nontender, nondistended, + BS MS: no deformity or atrophy  Skin: warm and dry Neuro:  Strength and sensation are intact Psych: euthymic mood, full affect   Labs    High Sensitivity Troponin:   Recent Labs  Lab 12/16/21 0658 12/25/21 2244 12/26/21 0808 01/03/22 1414 01/04/22 0550  TROPONINIHS 72* 15 13 51* 36*      Chemistry Recent Labs  Lab 01/04/22 0550 01/05/22 0604 01/06/22 0436  NA 136 136 134*  K 4.4 4.1 4.0  CL 105 106 106  CO2  21* 22 21*  GLUCOSE 107* 109* 117*  BUN 28* 17 15  CREATININE 1.52* 1.34* 1.37*  CALCIUM 8.2* 7.8* 7.7*  MG 2.2 1.9 1.9  GFRNONAA 52* >60 59*  ANIONGAP 10 8 7      Lipids  Recent Labs  Lab 01/04/22 0550  CHOL 226*  TRIG 305*  HDL 14*  LDLCALC 151*  CHOLHDL 16.1     Hematology Recent Labs  Lab 01/04/22 0550 01/05/22 0604 01/05/22 1255 01/06/22 0436  WBC 17.1* 20.5*  --  19.1*  RBC 4.39 3.37*  --  3.26*  HGB 11.0* 8.3* 8.9* 8.1*  HCT 32.4* 24.1*  --  23.0*  MCV 73.8* 71.5*  --  70.6*  MCH 25.1* 24.6*  --  24.8*  MCHC 34.0 34.4  --  35.2  RDW 16.1* 15.8*  --  15.8*  PLT 225 198  --  216    Thyroid No results for input(s): TSH, FREET4 in the last 168 hours.  BNP Recent Labs  Lab 01/04/22 0550  BNP 450.2*     DDimer No results for input(s): DDIMER in the last 168 hours.   Radiology    CT ABDOMEN PELVIS W CONTRAST  Result Date: 01/05/2022 CLINICAL DATA:  History of portal vein thrombosis. EXAM: CT  ABDOMEN AND PELVIS WITH CONTRAST TECHNIQUE: Multidetector CT imaging of the abdomen and pelvis was performed using the standard protocol following bolus administration of intravenous contrast. CONTRAST:  173mL OMNIPAQUE IOHEXOL 300 MG/ML  SOLN COMPARISON:  12/26/2021 FINDINGS: Lower chest: Basilar atelectasis bilaterally.  Heart is enlarged. Hepatobiliary: No suspicious focal abnormality within the liver parenchyma. Gallbladder wall is ill-defined with apparent mild pericholecystic edema. No intrahepatic or extrahepatic biliary dilation. Pancreas: No focal mass lesion. No dilatation of the main duct. No intraparenchymal cyst. No peripancreatic edema. Spleen: No splenomegaly. No focal mass lesion. Adrenals/Urinary Tract: No adrenal nodule or mass. Right kidney unremarkable. 4.7 cm cyst lower pole left kidney is similar to prior with 13 mm low-density lower interpolar lesion, also likely a cyst No evidence for hydroureter. The urinary bladder appears normal for the degree of  distention. Stomach/Bowel: Stomach is unremarkable. No gastric wall thickening. No evidence of outlet obstruction. Duodenum is normally positioned as is the ligament of Treitz. No small bowel wall thickening. No small bowel dilatation. The terminal ileum is normal. The appendix is normal. No gross colonic mass. No colonic wall thickening. Diverticular changes are noted in the left colon without evidence of diverticulitis. Vascular/Lymphatic: There is mild atherosclerotic calcification of the abdominal aorta without aneurysm. Known thrombus again identified in the main portal vein. Thrombus is identified in the superior mesenteric vein, extending into the portal splenic confluence up the portal vein and into the left and right branches of the portal venous anatomy. The patient had gas in the portal vein on the previous study but this is progressive in the interval with gas now seen in both the right and left portal vein, main portal vein, and SMV. There is some gas in small mesenteric venous anatomy of the left abdomen (51/2) although no small bowel wall thickening or evidence for pneumatosis. Central mesentery in the left abdomen is mildly congested. Reproductive: The prostate gland and seminal vesicles are unremarkable. Other: No intraperitoneal free fluid. Musculoskeletal: No worrisome lytic or sclerotic osseous abnormality. IMPRESSION: 1. Thrombus again identified in the portal vein with involvement of the superior mesenteric vein and left/right intrahepatic portal venous anatomy. Small foci of peripheral mesenteric venous gas noted in the left abdomen with interval increase in volume of portal venous gas since prior study of 12/26/2021. Portal venous gas can be seen in the setting of bowel ischemia although no evidence for large or small bowel pneumatosis at this time and no small bowel wall thickening. Gas can also be seen in the setting of superinfection. No interloop mesenteric fluid. No intraperitoneal free  fluid. 2. Ill-defined gallbladder wall with apparent mild pericholecystic edema. No biliary duct dilatation. If there is clinical concern for acute cholecystitis, right upper quadrant ultrasound could be used to further evaluate. 3. Left renal cysts. 4. Aortic Atherosclerosis (ICD10-I70.0). Electronically Signed   By: Misty Stanley M.D.   On: 01/05/2022 10:47    Cardiac Studies   2D echo 12/18/2021: 1. Left ventricular ejection fraction, by estimation, is 20 to 25%. The  left ventricle has severely decreased function. The left ventricle  demonstrates global hypokinesis. The left ventricular internal cavity size  was mildly dilated. There is mild left  ventricular hypertrophy. Left ventricular diastolic parameters are  consistent with Grade III diastolic dysfunction (restrictive).   2. Right ventricular systolic function is normal. The right ventricular  size is normal. There is mildly elevated pulmonary artery systolic  pressure.   3. Left atrial size was severely dilated.   4. Right  atrial size was mildly dilated.   5. The mitral valve is normal in structure. Mild to moderate mitral valve  regurgitation. No evidence of mitral stenosis.   6. The aortic valve is normal in structure. Aortic valve regurgitation is  not visualized. No aortic stenosis is present.   7. The inferior vena cava is normal in size with greater than 50%  respiratory variability, suggesting right atrial pressure of 3 mmHg.    Patient Profile     61 y.o. male with a hx of HFrEF, HTN, obesity, anemia, and OSA who is being seen today for the evaluation of Afib with RVR and HFrEF  Assessment & Plan    1.  New onset atrial fibrillation: Converted to sinus rhythm with IV amiodarone.  We Neftaly Inzunza continue oral amiodarone.  Currently on IV heparin.  CHA2DS2-VASc of 3.  Savhanna Sliva need NOAC at discharge.  2.  Systolic heart failure with reduced ejection fraction: Found in the last few months.  Ejection fraction 20 to 25%.  Currently  euvolemic on exam.  Lactate was elevated yesterday but it has resolved.  Likely due to heart failure in the setting of rapid atrial fibrillation.  Plan for potential right and left heart catheterization tomorrow.  We Brydon Spahr make n.p.o. after midnight tonight.  3.  Recent portal vein thrombosis in the setting of diverticulitis: Continue IV heparin.  Plan per primary team.   For questions or updates, please contact [place] Please consult [email or web address] for contact info under        Signed, Darrow Barreiro Meredith Leeds, MD  01/06/2022, 11:27 AM

## 2022-01-06 NOTE — Progress Notes (Signed)
ANTICOAGULATION CONSULT NOTE  Pharmacy Consult for heparin Indication: atrial fibrillation  No Known Allergies  Patient Measurements: Height: 5\' 3"  (160 cm) Weight: 106.1 kg (234 lb) IBW/kg (Calculated) : 56.9 Heparin Dosing Weight: 81 kg  Vital Signs: Temp: 100.9 F (38.3 C) (01/08 1139) Temp Source: Oral (01/08 1139) BP: 122/76 (01/08 1139) Pulse Rate: 93 (01/08 1139)  Labs: Recent Labs    01/03/22 1414 01/03/22 1414 01/04/22 0550 01/04/22 1330 01/05/22 0604 01/05/22 0841 01/05/22 1255 01/05/22 1500 01/05/22 2145 01/06/22 0436 01/06/22 1334  HGB  --    < > 11.0*  --  8.3*  --  8.9*  --   --  8.1*  --   HCT  --   --  32.4*  --  24.1*  --   --   --   --  23.0*  --   PLT  --   --  225  --  198  --   --   --   --  216  --   APTT  --   --  40*   < >  --  48*  --  57* 58*  --   --   LABPROT  --   --  15.3*  --   --   --   --   --   --   --   --   INR  --   --  1.2  --   --   --   --   --   --   --   --   HEPARINUNFRC  --    < > >1.10*  --   --  0.34  --  0.33 0.37 0.30 0.23*  CREATININE 1.53*  --  1.52*  --  1.34*  --   --   --   --  1.37*  --   TROPONINIHS 51*  --  36*  --   --   --   --   --   --   --   --    < > = values in this interval not displayed.     Estimated Creatinine Clearance: 62.1 mL/min (A) (by C-G formula based on SCr of 1.37 mg/dL (H)).   Medical History: Past Medical History:  Diagnosis Date   Arthritis    Cardiomyopathy ([place])    a. 11/2021 Echo: EF 20-25%, glob HK. GrIII DD. Nl RV size/fxn. Mildly elev PASP. Sev dil LA, mildly dil RA. Mild to mod MR.   Gout    Hypertension    NSVT (nonsustained ventricular tachycardia) 11/2021   PSVT (paroxysmal supraventricular tachycardia) ([place]) 11/2021    Medications:  Eliquis PTA  Assessment: 61 year old male with new diagnosis of atrial fibrillation at Cardiology office this morning. He is on Eliquis for recent diagnosis of portal vein thrombosis during hospitalization in December. Pharmacy  consulted for heparin management in anticipation for need of right and left cardiac catheterization.  Baseline: aPPT/HL/INR never collected   01/04/22 0550 aPTT 40 sec, HL > 1.10  SUBtherapeutic  01/04/22 1330 aPTT 48 sec,  SUBtherapeutic 01/05/22 0056 aPTT 52 sec, SUBtherapeutic  01/05/22 0841 aPTT 48 sec, HL 0.34   01/05/22 1500 aPTT 57 sec, SUBtherapeutic 01/05/22 2145 aPTT 58 sec, HL 0.37 therapeutic  01/06/22 0436 HL 0.30 Hgb 11.0 >> 8.1 01/06/22 1334 HL 0.23   Goal of Therapy:  Heparin level 0.3-0.7 units/ml aPTT 66-102 seconds Monitor platelets by anticoagulation protocol: Yes   Plan:  Heparin level  is subtherapeutic. Will give  heparin bolus 1200 units x 1 and increase heparin infusion to 2700 units/hr. Recheck heparin level in 6 hours. CBC daily while on heparin.    Oswald Hillock, PharmD, BCPS Clinical Pharmacist   01/06/2022 1:57 PM

## 2022-01-06 NOTE — Progress Notes (Signed)
PROGRESS NOTE    Steven Pham  [id number] DOB: June 06, 1961 DOA: 01/03/2022 PCP: [facility]  Outpatient Specialists: none    Brief Narrative:   82m hx htn, osa not on cpap, two recent hospital admissions first for diverticulitis where found to have systolic heart failure and again for gout flare where found to have portal vein thrombosis, sent in 1/5 from cardiology clinic for a fib with rvr.   Assessment & Plan:   Principal Problem:   Atrial fibrillation with rapid ventricular response (HCC) Active Problems:   Acute combined systolic and diastolic congestive heart failure (HCC)   Portal vein thrombosis   HTN (hypertension)   Long term current use of anticoagulant therapy  # Atrial fibrillation with rapid ventricular response Sinus rhythm restored w/ amio gtt now transitioned to oral. Hypotensive after gtt, that has improved - cardiology following, tentative plan for cath on Monday though now potentially on hold 2/2 bacteremia - cont amio - heparin gtt - further evidence-based therapy per cardiology  # Combined systolic/diastolic heart failure Recent echo with ef 20-25%, grade 3 dd, mild/mod mitral regurg. Here appears mildly volume overload, breathing comfortably on room air - diuresis and therapeutics per cardiology  # Bacteremia Strep and e coli growing in blood culture, could be cause of portal vein thrombosis. New fever this morning - ID consulted - per ID switch abx from ceftriaxone (started 1/7) to zosyn (started 1/8) - f/u cultures - f/u TTE  # OSA Not on cpap - treatment would be helpful  # Portal vein thrombosis Recent diagnosis. Positive blood culture as above. 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 No answer when nephew called 1/8  Level of care: progressive Status is: Inpatient  Remains inpatient appropriate because: severity of illness    Consultants:  cardiology  Procedures: none  Antimicrobials:  none    Subjective: No complaints, no chest pain or palpitations, no abd pain, no n/v/d, no dysuria  Objective: Vitals:   01/06/22 0433 01/06/22 0754 01/06/22 1003 01/06/22 1139  BP: 94/60 93/63 96/65  122/76  Pulse: 88 77 81 93  Resp: 18 18  18   Temp: (!) 100.4 F (38 C) 99 F (37.2 C)  (!) 100.9 F (38.3 C)  TempSrc:  Oral  Oral  SpO2: 97% 97% 98% 100%  Weight:      Height:        Intake/Output Summary (Last 24 hours) at 01/06/2022 1301 Last data filed at 01/06/2022 1016 Gross per 24 hour  Intake 1678.2 ml  Output --  Net 1678.2 ml   Filed Weights   01/03/22 1200  Weight: 106.1 kg    Examination:  General exam: Appears calm and comfortable  Respiratory system: Clear to auscultation. Respiratory effort normal. Cardiovascular system: S1 & S2 heard, tachycardic, mild systolic murmur Gastrointestinal system: Abdomen is distended, soft and nontender. No organomegaly or masses  felt. Normal bowel sounds heard. Central nervous system: Alert and oriented. No focal neurological deficits. Extremities: Symmetric 5 x 5 power. Skin: No rashes, lesions or ulcers. Pitting edema knees Psychiatry: Judgement and insight appear normal. Mood & affect appropriate.     Data Reviewed: I have personally reviewed following labs and imaging studies  CBC: Recent Labs  Lab  01/03/22 1248 01/04/22 0550 01/05/22 0604 01/05/22 1255 01/06/22 0436  WBC 19.7* 17.1* 20.5*  --  19.1*  HGB 12.1* 11.0* 8.3* 8.9* 8.1*  HCT 34.6* 32.4* 24.1*  --  23.0*  MCV 70.0* 73.8* 71.5*  --  70.6*  PLT 253 225 198  --  [id number]   Basic Metabolic Panel: Recent Labs  Lab 01/03/22 1414 01/04/22 0550 01/05/22 0604 01/06/22 0436  NA 136 136 136 134*  K 4.5 4.4 4.1 4.0  CL 103 105 106 106  CO2 23 21* 22 21*  GLUCOSE 99 107* 109* 117*  BUN 37* 28* 17 15  CREATININE 1.53* 1.52* 1.34* 1.37*  CALCIUM 8.3* 8.2* 7.8* 7.7*  MG 2.5* 2.2 1.9 1.9   GFR: Estimated Creatinine Clearance: 62.1 mL/min (A) (by C-G formula based on SCr of 1.37 mg/dL (H)). Liver Function Tests: No results for input(s): AST, ALT, ALKPHOS, BILITOT, PROT, ALBUMIN in the last 168 hours. No results for input(s): LIPASE, AMYLASE in the last 168 hours. No results for input(s): AMMONIA in the last 168 hours. Coagulation Profile: Recent Labs  Lab 01/04/22 0550  INR 1.2   Cardiac Enzymes: No results for input(s): CKTOTAL, CKMB, CKMBINDEX, TROPONINI in the last 168 hours. BNP (last 3 results) No results for input(s): PROBNP in the last 8760 hours. HbA1C: Recent Labs    01/05/22 0604  HGBA1C 6.6*   CBG: Recent Labs  Lab 01/05/22 1256  GLUCAP 161*   Lipid Profile: Recent Labs    01/04/22 0550  CHOL 226*  HDL 14*  LDLCALC 151*  TRIG 305*  CHOLHDL 16.1   Thyroid Function Tests: No results for input(s): TSH, T4TOTAL, FREET4, T3FREE, THYROIDAB in the last 72 hours. Anemia Panel: No results for input(s): VITAMINB12, FOLATE, FERRITIN, TIBC, IRON, RETICCTPCT in the last 72 hours. Urine analysis:    Component Value Date/Time   COLORURINE AMBER (A) 01/04/2022 0820   APPEARANCEUR CLEAR (A) 01/04/2022 0820   LABSPEC 1.012 01/04/2022 0820   PHURINE 5.0 01/04/2022 0820   GLUCOSEU NEGATIVE 01/04/2022 0820   HGBUR NEGATIVE 01/04/2022 0820   BILIRUBINUR NEGATIVE 01/04/2022 0820   KETONESUR NEGATIVE  01/04/2022 0820   PROTEINUR NEGATIVE 01/04/2022 0820   NITRITE NEGATIVE 01/04/2022 0820   LEUKOCYTESUR NEGATIVE 01/04/2022 0820   Sepsis Labs: @LABRCNTIP (procalcitonin:4,lacticidven:4)  ) Recent Results (from the past 240 hour(s))  Resp Panel by RT-PCR (Flu A&B, Covid) Nasopharyngeal Swab     Status: None   Collection Time: 01/03/22  9:25 AM   Specimen: Nasopharyngeal Swab; Nasopharyngeal(NP) swabs in vial transport medium  Result Value Ref Range Status   SARS Coronavirus 2 by RT PCR NEGATIVE NEGATIVE Final    Comment: (NOTE) SARS-CoV-2 target nucleic acids are NOT DETECTED.  The SARS-CoV-2 RNA is generally detectable in upper respiratory specimens during the acute phase of infection. The lowest concentration of SARS-CoV-2 viral copies this assay can detect is 138 copies/mL. A negative result does not preclude SARS-Cov-2 infection and should not be used as the sole basis for treatment or other patient management decisions. A negative result may occur with  improper specimen collection/handling, submission of specimen other than nasopharyngeal swab, presence of  viral mutation(s) within the areas targeted by this assay, and inadequate number of viral copies(<138 copies/mL). A negative result must be combined with clinical observations, patient history, and epidemiological information. The expected result is Negative.  Fact Sheet for Patients:  EntrepreneurPulse.com.au  Fact Sheet for Healthcare Providers:  IncredibleEmployment.be  This test is no t yet approved or cleared by the [place] FDA and  has been authorized for detection and/or diagnosis of SARS-CoV-2 by FDA under an Emergency Use Authorization (EUA). This EUA will remain  in effect (meaning this test can be used) for the duration of the COVID-19 declaration under Section 564(b)(1) of the Act, 21 U.S.C.section 360bbb-3(b)(1), unless the authorization is terminated  or revoked  sooner.       Influenza A by PCR NEGATIVE NEGATIVE Final   Influenza B by PCR NEGATIVE NEGATIVE Final    Comment: (NOTE) The Xpert Xpress SARS-CoV-2/FLU/RSV plus assay is intended as an aid in the diagnosis of influenza from Nasopharyngeal swab specimens and should not be used as a sole basis for treatment. Nasal washings and aspirates are unacceptable for Xpert Xpress SARS-CoV-2/FLU/RSV testing.  Fact Sheet for Patients: EntrepreneurPulse.com.au  Fact Sheet for Healthcare Providers: IncredibleEmployment.be  This test is not yet approved or cleared by the [place] FDA and has been authorized for detection and/or diagnosis of SARS-CoV-2 by FDA under an Emergency Use Authorization (EUA). This EUA will remain in effect (meaning this test can be used) for the duration of the COVID-19 declaration under Section 564(b)(1) of the Act, 21 U.S.C. section 360bbb-3(b)(1), unless the authorization is terminated or revoked.  Performed at [facility], [address] [place], [place] [address]   Culture, blood (Routine X 2) w Reflex to ID Panel     Status: None (Preliminary result)   Collection Time: 01/05/22  6:05 AM   Specimen: BLOOD  Result Value Ref Range Status   Specimen Description   Final    BLOOD BLOOD RIGHT HAND Performed at [facility], [address] [place], [place] [address]    Special Requests   Final    BOTTLES DRAWN AEROBIC AND ANAEROBIC Blood Culture adequate volume Performed at [facility], [address] [place], [place] [address]    Culture  Setup Time   Final    Organism ID to follow GRAM NEGATIVE RODS IN BOTH AEROBIC AND ANAEROBIC BOTTLES CRITICAL RESULT CALLED TO, READ BACK BY AND VERIFIED WITH: ABBY ELLINGTON ON 01/05/22 AT 81 QSD    Culture   Final    GRAM NEGATIVE RODS TOO YOUNG TO READ Performed at [facility], [place] [address], [place], [place] [address]    Report Status  PENDING  Incomplete  Blood Culture ID Panel (Reflexed)     Status: Abnormal   Collection Time: 01/05/22  6:05 AM  Result Value Ref Range Status   Enterococcus faecalis NOT DETECTED NOT DETECTED Final   Enterococcus Faecium NOT DETECTED NOT DETECTED Final   Listeria monocytogenes NOT DETECTED NOT DETECTED Final   Staphylococcus species NOT DETECTED NOT DETECTED Final   Staphylococcus aureus (BCID) NOT DETECTED NOT DETECTED Final   Staphylococcus epidermidis NOT DETECTED NOT DETECTED Final   Staphylococcus lugdunensis NOT DETECTED NOT DETECTED Final   Streptococcus species DETECTED (A) NOT DETECTED Final    Comment: Not Enterococcus species, Streptococcus agalactiae, Streptococcus pyogenes, or Streptococcus pneumoniae. CRITICAL RESULT CALLED TO, READ BACK BY AND VERIFIED WITH: ABBY ELLINGTON ON 01/05/22 AT 1736 QSD    Streptococcus agalactiae NOT  DETECTED NOT DETECTED Final   Streptococcus pneumoniae NOT DETECTED NOT DETECTED Final   Streptococcus pyogenes NOT DETECTED NOT DETECTED Final   A.calcoaceticus-baumannii NOT DETECTED NOT DETECTED Final   Bacteroides fragilis NOT DETECTED NOT DETECTED Final   Enterobacterales DETECTED (A) NOT DETECTED Final    Comment: Enterobacterales represent a large order of gram negative bacteria, not a single organism. CRITICAL RESULT CALLED TO, READ BACK BY AND VERIFIED WITH: ABBY ELLINGTON ON 01/05/22 AT 1736 QSD    Enterobacter cloacae complex NOT DETECTED NOT DETECTED Final   Escherichia coli DETECTED (A) NOT DETECTED Final    Comment: CRITICAL RESULT CALLED TO, READ BACK BY AND VERIFIED WITH: ABBY ELLINGTON ON 01/05/22 AT 1736 QSD    Klebsiella aerogenes NOT DETECTED NOT DETECTED Final   Klebsiella oxytoca NOT DETECTED NOT DETECTED Final   Klebsiella pneumoniae NOT DETECTED NOT DETECTED Final   Proteus species NOT DETECTED NOT DETECTED Final   Salmonella species NOT DETECTED NOT DETECTED Final   Serratia marcescens NOT DETECTED NOT DETECTED Final    Haemophilus influenzae NOT DETECTED NOT DETECTED Final   Neisseria meningitidis NOT DETECTED NOT DETECTED Final   Pseudomonas aeruginosa NOT DETECTED NOT DETECTED Final   Stenotrophomonas maltophilia NOT DETECTED NOT DETECTED Final   Candida albicans NOT DETECTED NOT DETECTED Final   Candida auris NOT DETECTED NOT DETECTED Final   Candida glabrata NOT DETECTED NOT DETECTED Final   Candida krusei NOT DETECTED NOT DETECTED Final   Candida parapsilosis NOT DETECTED NOT DETECTED Final   Candida tropicalis NOT DETECTED NOT DETECTED Final   Cryptococcus neoformans/gattii NOT DETECTED NOT DETECTED Final   CTX-M ESBL NOT DETECTED NOT DETECTED Final   Carbapenem resistance IMP NOT DETECTED NOT DETECTED Final   Carbapenem resistance KPC NOT DETECTED NOT DETECTED Final   Carbapenem resistance NDM NOT DETECTED NOT DETECTED Final   Carbapenem resist OXA 48 LIKE NOT DETECTED NOT DETECTED Final   Carbapenem resistance VIM NOT DETECTED NOT DETECTED Final    Comment: Performed at [facility], [address] [place], [place] [address]         Radiology Studies: CT ABDOMEN PELVIS W CONTRAST  Result Date: 01/05/2022 CLINICAL DATA:  History of portal vein thrombosis. EXAM: CT ABDOMEN AND PELVIS WITH CONTRAST TECHNIQUE: Multidetector CT imaging of the abdomen and pelvis was performed using the standard protocol following bolus administration of intravenous contrast. CONTRAST:  127mL OMNIPAQUE IOHEXOL 300 MG/ML  SOLN COMPARISON:  12/26/2021 FINDINGS: Lower chest: Basilar atelectasis bilaterally.  Heart is enlarged. Hepatobiliary: No suspicious focal abnormality within the liver parenchyma. Gallbladder wall is ill-defined with apparent mild pericholecystic edema. No intrahepatic or extrahepatic biliary dilation. Pancreas: No focal mass lesion. No dilatation of the main duct. No intraparenchymal cyst. No peripancreatic edema. Spleen: No splenomegaly. No focal mass lesion. Adrenals/Urinary Tract: No  adrenal nodule or mass. Right kidney unremarkable. 4.7 cm cyst lower pole left kidney is similar to prior with 13 mm low-density lower interpolar lesion, also likely a cyst No evidence for hydroureter. The urinary bladder appears normal for the degree of distention. Stomach/Bowel: Stomach is unremarkable. No gastric wall thickening. No evidence of outlet obstruction. Duodenum is normally positioned as is the ligament of Treitz. No small bowel wall thickening. No small bowel dilatation. The terminal ileum is normal. The appendix is normal. No gross colonic mass. No colonic wall thickening. Diverticular changes are noted in the left colon without evidence of diverticulitis. Vascular/Lymphatic: There is mild atherosclerotic calcification of the abdominal aorta without aneurysm. Known  thrombus again identified in the main portal vein. Thrombus is identified in the superior mesenteric vein, extending into the portal splenic confluence up the portal vein and into the left and right branches of the portal venous anatomy. The patient had gas in the portal vein on the previous study but this is progressive in the interval with gas now seen in both the right and left portal vein, main portal vein, and SMV. There is some gas in small mesenteric venous anatomy of the left abdomen (51/2) although no small bowel wall thickening or evidence for pneumatosis. Central mesentery in the left abdomen is mildly congested. Reproductive: The prostate gland and seminal vesicles are unremarkable. Other: No intraperitoneal free fluid. Musculoskeletal: No worrisome lytic or sclerotic osseous abnormality. IMPRESSION: 1. Thrombus again identified in the portal vein with involvement of the superior mesenteric vein and left/right intrahepatic portal venous anatomy. Small foci of peripheral mesenteric venous gas noted in the left abdomen with interval increase in volume of portal venous gas since prior study of 12/26/2021. Portal venous gas can be  seen in the setting of bowel ischemia although no evidence for large or small bowel pneumatosis at this time and no small bowel wall thickening. Gas can also be seen in the setting of superinfection. No interloop mesenteric fluid. No intraperitoneal free fluid. 2. Ill-defined gallbladder wall with apparent mild pericholecystic edema. No biliary duct dilatation. If there is clinical concern for acute cholecystitis, right upper quadrant ultrasound could be used to further evaluate. 3. Left renal cysts. 4. Aortic Atherosclerosis (ICD10-I70.0). Electronically Signed   By: Misty Stanley M.D.   On: 01/05/2022 10:47        Scheduled Meds:  amiodarone  400 mg Oral BID   Continuous Infusions:  heparin 2,550 Units/hr (01/06/22 1149)     LOS: 3 days    Time spent: 84  min    Desma Maxim, MD [facility]   If 7PM-7AM, please contact night-coverage [email or web address] Password [email or web address] 01/06/2022, 1:01 PM

## 2022-01-06 NOTE — Progress Notes (Signed)
Pharmacy Antibiotic Note  Steven Pham is a 61 y.o. male admitted on 01/03/2022. Pharmacy has been consulted for Zosyn dosing for bacteremia per ID physician Dr. Rivka Safer.  Plan: Zosyn 3.375 g IV q8h extended infusion  Height: 5\' 3"  (160 cm) Weight: 106.1 kg (234 lb) IBW/kg (Calculated) : 56.9  Temp (24hrs), Avg:99.4 F (37.4 C), Min:98.3 F (36.8 C), Max:100.9 F (38.3 C)  Recent Labs  Lab 01/03/22 1248 01/03/22 1414 01/04/22 0550 01/05/22 0604 01/05/22 1255 01/06/22 0436  WBC 19.7*  --  17.1* 20.5*  --  19.1*  CREATININE  --  1.53* 1.52* 1.34*  --  1.37*  LATICACIDVEN  --   --  3.8*  --  2.7* 1.1    Estimated Creatinine Clearance: 62.1 mL/min (A) (by C-G formula based on SCr of 1.37 mg/dL (H)).    No Known Allergies  Antimicrobials this admission: Zosyn 1/8 >> Ceftriaxone 1/7 >> 1/8   Microbiology results: 1/7 BCx: strep species, E.coli   Thank you for allowing pharmacy to be a part of this patients care.  3/7, PharmD, BCPS Clinical Pharmacist 01/06/2022 1:06 PM

## 2022-01-06 NOTE — Progress Notes (Signed)
Patient ID: Steven Pham, male   DOB: 06-Dec-1961, 61 y.o.   MRN: [id number]  Interviewed and examined on rounds this morning.  He has no new complaints.  Vital signs are satisfactory and he remains essentially afebrile with 1 recorded low grade temp greater than 100.  On examination his abdomen remains soft nontender and without guarding.  Review of pharmacy notes reveals that his anticoagulation has remained a bit suboptimal with goal PTT greater than twice normal not yet achieved.  Continue antibiotic therapy and anticoagulation for now.  Transition to oral DOAC upon discharge.  We will remain available as needed.

## 2022-01-06 NOTE — Progress Notes (Signed)
°   01/06/22 1829  Assess: MEWS Score  Temp (!) 100.8 F (38.2 C)  BP 96/63  Pulse Rate 92  Resp (!) 24  Level of Consciousness Alert  SpO2 97 %  O2 Device Room Air  Assess: MEWS Score  MEWS Temp 1  MEWS Systolic 1  MEWS Pulse 0  MEWS RR 1  MEWS LOC 0  MEWS Score 3  MEWS Score Color Yellow  Assess: if the MEWS score is Yellow or Red  Were vital signs taken at a resting state? Yes  Focused Assessment No change from prior assessment  Does the patient meet 2 or more of the SIRS criteria? No  Does the patient have a confirmed or suspected source of infection? Yes  Provider and Rapid Response Notified? Yes  MEWS guidelines implemented *See Row Information* Yes  Treat  MEWS Interventions Administered prn meds/treatments  Pain Scale 0-10  Pain Score 0  Take Vital Signs  Increase Vital Sign Frequency  Yellow: Q 2hr X 2 then Q 4hr X 2, if remains yellow, continue Q 4hrs  Escalate  MEWS: Escalate Yellow: discuss with charge nurse/RN and consider discussing with provider and RRT  Notify: Charge Nurse/RN  Name of Charge Nurse/RN Notified [place]  Date Charge Nurse/RN Notified 01/06/22  Time Charge Nurse/RN Notified 1835  Notify: Provider  Provider Name/Title [facility]  Date Provider Notified 01/06/22  Time Provider Notified 1836  Notification Type Page  Notification Reason Other (Comment) (MEWS score yellow)  Provider response See new orders  Date of Provider Response 01/06/22  Time of Provider Response 1846  Notify: Rapid Response  Name of Rapid Response RN Notified [place] [facility]/gallery curator nurse)  Date Rapid Response Notified 01/06/22  Time Rapid Response Notified 1838  Assess: SIRS CRITERIA  SIRS Temperature  0  SIRS Pulse 1  SIRS Respirations  1  SIRS WBC 0  SIRS Score Sum  2

## 2022-01-06 NOTE — Progress Notes (Signed)
*  PRELIMINARY RESULTS* Echocardiogram 2D Echocardiogram has been performed. Definity IV Contrast was used on this study.  Steven Pham 01/06/2022, 2:53 PM

## 2022-01-06 NOTE — Progress Notes (Signed)
Discussed with Dr. Ashok Pall. In light of positive blood cultures with mixed enteric species (strep, enterobacter and e. 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 We discussed ID consultation and broadening coverage for anaerobic as well. Continue full anticoagulation for the long term. There is no suitable vascular intervention for this disease entity, so care remains directed at suppressing infection and limiting thrombus e xtension.

## 2022-01-07 DIAGNOSIS — R7881 Bacteremia: Secondary | ICD-10-CM

## 2022-01-07 DIAGNOSIS — B9620 Unspecified Escherichia coli [E. coli] as the cause of diseases classified elsewhere: Secondary | ICD-10-CM | POA: Diagnosis not present

## 2022-01-07 DIAGNOSIS — K751 Phlebitis of portal vein: Secondary | ICD-10-CM | POA: Diagnosis not present

## 2022-01-07 DIAGNOSIS — I81 Portal vein thrombosis: Secondary | ICD-10-CM

## 2022-01-07 LAB — BASIC METABOLIC PANEL
Anion gap: 5 (ref 5–15)
BUN: 13 mg/dL (ref 6–20)
CO2: 24 mmol/L (ref 22–32)
Calcium: 7.7 mg/dL — ABNORMAL LOW (ref 8.9–10.3)
Chloride: 105 mmol/L (ref 98–111)
Creatinine, Ser: 1.33 mg/dL — ABNORMAL HIGH (ref 0.61–1.24)
GFR, Estimated: >60 mL/min (ref >60)
Glucose, Bld: 114 mg/dL — ABNORMAL HIGH (ref 70–99)
Potassium: 4 mmol/L (ref 3.5–5.1)
Sodium: 134 mmol/L — ABNORMAL LOW (ref 135–145)

## 2022-01-07 LAB — CBC
HCT: 22.1 % — ABNORMAL LOW (ref 39.0–52.0)
Hemoglobin: 7.7 g/dL — ABNORMAL LOW (ref 13.0–17.0)
MCH: 25 pg — ABNORMAL LOW (ref 26.0–34.0)
MCHC: 34.8 g/dL (ref 30.0–36.0)
MCV: 71.8 fL — ABNORMAL LOW (ref 80.0–100.0)
Platelets: 248 10*3/uL (ref 150–400)
RBC: 3.08 MIL/uL — ABNORMAL LOW (ref 4.22–5.81)
RDW: 16.2 % — ABNORMAL HIGH (ref 11.5–15.5)
WBC: 15.8 10*3/uL — ABNORMAL HIGH (ref 4.0–10.5)
nRBC: 0 % (ref 0.0–0.2)

## 2022-01-07 LAB — DIC (DISSEMINATED INTRAVASCULAR COAGULATION)PANEL
D-Dimer, Quant: 3.13 ug/mL-FEU — ABNORMAL HIGH (ref 0.00–0.50)
Fibrinogen: 742 mg/dL — ABNORMAL HIGH (ref 210–475)
INR: 1.3 — ABNORMAL HIGH (ref 0.8–1.2)
Platelets: 237 10*3/uL (ref 150–400)
Prothrombin Time: 16.3 seconds — ABNORMAL HIGH (ref 11.4–15.2)
Smear Review: NONE SEEN
aPTT: 97 seconds — ABNORMAL HIGH (ref 24–36)

## 2022-01-07 LAB — HEPATIC FUNCTION PANEL
ALT: 29 U/L (ref 0–44)
AST: 23 U/L (ref 15–41)
Albumin: 1.7 g/dL — ABNORMAL LOW (ref 3.5–5.0)
Alkaline Phosphatase: 156 U/L — ABNORMAL HIGH (ref 38–126)
Bilirubin, Direct: 1.1 mg/dL — ABNORMAL HIGH (ref 0.0–0.2)
Indirect Bilirubin: 1.3 mg/dL — ABNORMAL HIGH (ref 0.3–0.9)
Total Bilirubin: 2.4 mg/dL — ABNORMAL HIGH (ref 0.3–1.2)
Total Protein: 6.3 g/dL — ABNORMAL LOW (ref 6.5–8.1)

## 2022-01-07 LAB — MAGNESIUM: Magnesium: 2 mg/dL (ref 1.7–2.4)

## 2022-01-07 LAB — HEPARIN LEVEL (UNFRACTIONATED)
Heparin Unfractionated: 0.31 IU/mL (ref 0.30–0.70)
Heparin Unfractionated: 0.24 IU/mL — ABNORMAL LOW (ref 0.30–0.70)

## 2022-01-07 LAB — SARS CORONAVIRUS 2 (TAT 6-24 HRS): SARS Coronavirus 2: NEGATIVE

## 2022-01-07 LAB — LACTIC ACID, PLASMA: Lactic Acid, Venous: 0.7 mmol/L (ref 0.5–1.9)

## 2022-01-07 MED ORDER — SODIUM CHLORIDE 0.9 % IV SOLN: INTRAVENOUS | Status: DC | PRN

## 2022-01-07 MED ORDER — ACETAMINOPHEN 325 MG PO TABS
650.0000 mg | ORAL_TABLET | ORAL | Status: DC | PRN
  Administered 2022-01-10: 650 mg via ORAL
  Filled 2022-01-07: qty 2

## 2022-01-07 MED ORDER — SODIUM CHLORIDE 0.9 % IV BOLUS
500.0000 mL | Freq: Once | INTRAVENOUS | Status: AC
  Administered 2022-01-07: 500 mL via INTRAVENOUS

## 2022-01-07 MED ORDER — ENOXAPARIN SODIUM 120 MG/0.8ML IJ SOSY
1.0000 mg/kg | PREFILLED_SYRINGE | Freq: Two times a day (BID) | INTRAMUSCULAR | Status: DC
  Administered 2022-01-07 – 2022-01-10 (×6): 105 mg via SUBCUTANEOUS
  Filled 2022-01-07 (×7): qty 0.7

## 2022-01-07 MED ORDER — HEPARIN BOLUS VIA INFUSION
1200.0000 [IU] | Freq: Once | INTRAVENOUS | Status: AC
  Administered 2022-01-07: 1200 [IU] via INTRAVENOUS
  Filled 2022-01-07: qty 1200

## 2022-01-07 NOTE — Progress Notes (Addendum)
PROGRESS NOTE    Steven Pham  [id number] DOB: 05-Jun-1961 DOA: 01/03/2022 PCP: [facility]  Outpatient Specialists: none    Brief Narrative:   61m hx htn, osa not on cpap, two recent hospital admissions first for diverticulitis where found to have systolic heart failure and again for gout flare where found to have portal vein thrombosis, sent in 1/5 from cardiology clinic for a fib with rvr.   Assessment & Plan:   Principal Problem:   Atrial fibrillation with rapid ventricular response (HCC) Active Problems:   Acute combined systolic and diastolic congestive heart failure (HCC)   Portal vein thrombosis   HTN (hypertension)   Long term current use of anticoagulant therapy  # Atrial fibrillation with rapid ventricular response Sinus rhythm restored w/ amio gtt now transitioned to oral. Hypotensive after gtt, that has improved - cardiology following, tentative plan for cath on Monday though now potentially on hold 2/2 bacteremia - cont amio - heparin gtt transition to lovenox today - further evidence-based therapy per cardiology  # Combined systolic/diastolic heart failure Recent echo with ef 20-25%, grade 3 dd, mild/mod mitral regurg. Here appears mildly volume overload, breathing comfortably on room air - diuresis and therapeutics per cardiology  # Bacteremia # Sepsis Strep and e coli growing in blood culture, could be cause of portal vein thrombosis. Febrile. Sepsis by fever, hypotension. Lactate wnl. TTE w/o vegetations - ID consulted - per ID switch abx from ceftriaxone (started 1/7) to zosyn (started 1/8) - f/u sensitivities - 500 cc bolus this morning  # OSA Not on cpap - treatment would be helpful  # Portal vein thrombosis Recent diagnosis. Positive blood culture as above. Gen surg evaluated does not think sig bowel ischemia. Septic etiology? - vascular also following - heparin transition to lovenox today - outpt vascular surg f/u  #  Anemia Hgb 7.7 from 11 on admission, no bleeding, benign abdomen, CT abd/pelvis w/o signs bleeding. 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 No answer when nephew called 1/8  Level of care: progressive Status is: Inpatient  Remains inpatient appropriate because: severity of illness    Consultants:  cardiology  Procedures: none  Antimicrobials:  none    Subjective: No complaints, no chest pain or palpitations, no abd pain, no n/v/d, no dysuria  Objective: Vitals:   01/07/22 0030 01/07/22 0400 01/07/22 0849 01/07/22 1229  BP: 100/63 (!) 98/54 109/70 (!) 83/53  Pulse: 94 74 87 95  Resp: (!) 21 (!) 25 (!) 27 (!) 30  Temp: (!) 102.1 F (38.9 C) 97.9 F (36.6 C) 99.5 F (37.5 C) (!) 101.4 F (38.6 C)  TempSrc: Oral Oral Oral Oral  SpO2: 97% 95% 96% 98%  Weight:      Height:        Intake/Output Summary (Last 24 hours) at 01/07/2022 1301 Last data filed at 01/07/2022 0647 Gross per 24 hour  Intake 1766.74 ml  Output --  Net 1766.74 ml   Filed Weights   01/03/22 1200  Weight: 106.1 kg    Examination:  General exam: Appears calm and comfortable  Respiratory system: Clear to auscultation. Respiratory effort  normal. Cardiovascular system: S1 & S2 heard, tachycardic, mild systolic murmur Gastrointestinal system: Abdomen is distended, soft and nontender. No organomegaly or masses felt. Normal bowel sounds heard. Central nervous system: Alert and oriented. No focal neurological deficits. Extremities: Symmetric 5 x 5 power. Skin: No rashes, lesions or ulcers. Pitting edema knees Psychiatry: Judgement and insight  appear normal. Mood & affect appropriate.     Data Reviewed: I have personally reviewed following labs and imaging studies  CBC: Recent Labs  Lab 01/03/22 1248 01/04/22 0550 01/05/22 0604 01/05/22 1255 01/06/22 0436 01/07/22 0542  WBC 19.7* 17.1* 20.5*  --  19.1* 15.8*  HGB 12.1* 11.0* 8.3* 8.9* 8.1* 7.7*  HCT 34.6* 32.4* 24.1*  --  23.0* 22.1*  MCV 70.0* 73.8* 71.5*  --  70.6* 71.8*  PLT 253 225 198  --  216 [id number]   Basic Metabolic Panel: Recent Labs  Lab 01/03/22 1414 01/04/22 0550 01/05/22 0604 01/06/22 0436 01/07/22 0542  NA 136 136 136 134* 134*  K 4.5 4.4 4.1 4.0 4.0  CL 103 105 106 106 105  CO2 23 21* 22 21* 24  GLUCOSE 99 107* 109* 117* 114*  BUN 37* 28* 17 15 13   CREATININE 1.53* 1.52* 1.34* 1.37* 1.33*  CALCIUM 8.3* 8.2* 7.8* 7.7* 7.7*  MG 2.5* 2.2 1.9 1.9 2.0   GFR: Estimated Creatinine Clearance: 64 mL/min (A) (by C-G formula based on SCr of 1.33 mg/dL (H)). Liver Function Tests: No results for input(s): AST, ALT, ALKPHOS, BILITOT, PROT, ALBUMIN in the last 168 hours. No results for input(s): LIPASE, AMYLASE in the last 168 hours. No results for input(s): AMMONIA in the last 168 hours. Coagulation Profile: Recent Labs  Lab 01/04/22 0550  INR 1.2   Cardiac Enzymes: No results for input(s): CKTOTAL, CKMB, CKMBINDEX, TROPONINI in the last 168 hours. BNP (last 3 results) No results for input(s): PROBNP in the last 8760 hours. HbA1C: Recent Labs    01/05/22 0604  HGBA1C 6.6*   CBG: Recent Labs  Lab 01/05/22 1256  GLUCAP 161*   Lipid Profile: No results for input(s): CHOL, HDL, LDLCALC, TRIG, CHOLHDL, LDLDIRECT in the last 72 hours.  Thyroid Function Tests: No results for input(s): TSH, T4TOTAL, FREET4, T3FREE, THYROIDAB in the last 72 hours. Anemia Panel: No results for input(s): VITAMINB12, FOLATE, FERRITIN, TIBC, IRON, RETICCTPCT in the last 72 hours. Urine analysis:    Component Value Date/Time   COLORURINE AMBER (A) 01/04/2022 0820    APPEARANCEUR CLEAR (A) 01/04/2022 0820   LABSPEC 1.012 01/04/2022 0820   PHURINE 5.0 01/04/2022 0820   GLUCOSEU NEGATIVE 01/04/2022 0820   HGBUR NEGATIVE 01/04/2022 0820   BILIRUBINUR NEGATIVE 01/04/2022 0820   KETONESUR NEGATIVE 01/04/2022 0820   PROTEINUR NEGATIVE 01/04/2022 0820   NITRITE NEGATIVE 01/04/2022 0820   LEUKOCYTESUR NEGATIVE 01/04/2022 0820   Sepsis Labs: @LABRCNTIP (procalcitonin:4,lacticidven:4)  ) Recent Results (from the past 240 hour(s))  Resp Panel by RT-PCR (Flu A&B, Covid) Nasopharyngeal Swab     Status: None   Collection Time: 01/03/22  9:25 AM   Specimen: Nasopharyngeal Swab; Nasopharyngeal(NP) swabs in vial transport medium  Result Value Ref Range Status   SARS Coronavirus 2 by RT PCR NEGATIVE NEGATIVE Final    Comment: (NOTE) SARS-CoV-2 target nucleic acids are NOT DETECTED.  The SARS-CoV-2 RNA is generally detectable in upper respiratory specimens during the acute phase of infection. The lowest concentration of SARS-CoV-2 viral copies this assay can detect is 138 copies/mL. A negative result does not preclude SARS-Cov-2 infection and should  not be used as the sole basis for treatment or other patient management decisions. A negative result may occur with  improper specimen collection/handling, submission of specimen other than nasopharyngeal swab, presence of viral mutation(s) within the areas targeted by this assay, and inadequate number of viral copies(<138 copies/mL). A negative result must be combined with clinical observations, patient history, and epidemiological information. The expected result is Negative.  Fact Sheet for Patients:  EntrepreneurPulse.com.au  Fact Sheet for Healthcare Providers:  IncredibleEmployment.be  This test is no t yet approved or cleared by the [place] FDA and  has been authorized for detection and/or diagnosis of SARS-CoV-2 by FDA under an Emergency Use Authorization  (EUA). This EUA will remain  in effect (meaning this test can be used) for the duration of the COVID-19 declaration under Section 564(b)(1) of the Act, 21 U.S.C.section 360bbb-3(b)(1), unless the authorization is terminated  or revoked sooner.       Influenza A by PCR NEGATIVE NEGATIVE Final   Influenza B by PCR NEGATIVE NEGATIVE Final    Comment: (NOTE) The Xpert Xpress SARS-CoV-2/FLU/RSV plus assay is intended as an aid in the diagnosis of influenza from Nasopharyngeal swab specimens and should not be used as a sole basis for treatment. Nasal washings and aspirates are unacceptable for Xpert Xpress SARS-CoV-2/FLU/RSV testing.  Fact Sheet for Patients: EntrepreneurPulse.com.au  Fact Sheet for Healthcare Providers: IncredibleEmployment.be  This test is not yet approved or cleared by the [place] FDA and has been authorized for detection and/or diagnosis of SARS-CoV-2 by FDA under an Emergency Use Authorization (EUA). This EUA will remain in effect (meaning this test can be used) for the duration of the COVID-19 declaration under Section 564(b)(1) of the Act, 21 U.S.C. section 360bbb-3(b)(1), unless the authorization is terminated or revoked.  Performed at [facility], [address] [place], [place] [address]   Culture, blood (Routine X 2) w Reflex to ID Panel     Status: Abnormal (Preliminary result)   Collection Time: 01/05/22  6:05 AM   Specimen: BLOOD  Result Value Ref Range Status   Specimen Description   Final    BLOOD BLOOD RIGHT HAND Performed at [facility], [address], [place], [place] [address]    Special Requests   Final    BOTTLES DRAWN AEROBIC AND ANAEROBIC Blood Culture adequate volume Performed at [facility], [address] [place], [place] [address]    Culture  Setup Time   Final    Organism ID to follow GRAM NEGATIVE RODS IN BOTH AEROBIC AND ANAEROBIC BOTTLES CRITICAL  RESULT CALLED TO, READ BACK BY AND VERIFIED WITH: ABBY ELLINGTON ON 01/05/22 AT 1736 QSD    Culture (A)  Final    ESCHERICHIA COLI SUSCEPTIBILITIES TO FOLLOW CULTURE REINCUBATED FOR BETTER GROWTH Performed at [facility], [place] [address] [place], [place] [address]    Report Status PENDING  Incomplete  Blood Culture ID Panel (Reflexed)     Status: Abnormal   Collection Time: 01/05/22  6:05 AM  Result Value Ref Range Status   Enterococcus faecalis NOT DETECTED NOT DETECTED Final   Enterococcus Faecium NOT DETECTED NOT DETECTED Final   Listeria monocytogenes NOT DETECTED NOT DETECTED Final   Staphylococcus species NOT DETECTED NOT DETECTED Final   Staphylococcus aureus (BCID) NOT DETECTED NOT DETECTED Final   Staphylococcus epidermidis NOT DETECTED NOT DETECTED Final   Staphylococcus lugdunensis NOT DETECTED NOT DETECTED Final   Streptococcus species DETECTED (A) NOT DETECTED Final  Comment: Not Enterococcus species, Streptococcus agalactiae, Streptococcus pyogenes, or Streptococcus pneumoniae. CRITICAL RESULT CALLED TO, READ BACK BY AND VERIFIED WITH: ABBY ELLINGTON ON 01/05/22 AT 1736 QSD    Streptococcus agalactiae NOT DETECTED NOT DETECTED Final   Streptococcus pneumoniae NOT DETECTED NOT DETECTED Final   Streptococcus pyogenes NOT DETECTED NOT DETECTED Final   A.calcoaceticus-baumannii NOT DETECTED NOT DETECTED Final   Bacteroides fragilis NOT DETECTED NOT DETECTED Final   Enterobacterales DETECTED (A) NOT DETECTED Final    Comment: Enterobacterales represent a large order of gram negative bacteria, not a single organism. CRITICAL RESULT CALLED TO, READ BACK BY AND VERIFIED WITH: ABBY ELLINGTON ON 01/05/22 AT 1736 QSD    Enterobacter cloacae complex NOT DETECTED NOT DETECTED Final   Escherichia coli DETECTED (A) NOT DETECTED Final    Comment: CRITICAL RESULT CALLED TO, READ BACK BY AND VERIFIED WITH: ABBY ELLINGTON ON 01/05/22 AT 1736 QSD    Klebsiella aerogenes NOT DETECTED  NOT DETECTED Final   Klebsiella oxytoca NOT DETECTED NOT DETECTED Final   Klebsiella pneumoniae NOT DETECTED NOT DETECTED Final   Proteus species NOT DETECTED NOT DETECTED Final   Salmonella species NOT DETECTED NOT DETECTED Final   Serratia marcescens NOT DETECTED NOT DETECTED Final   Haemophilus influenzae NOT DETECTED NOT DETECTED Final   Neisseria meningitidis NOT DETECTED NOT DETECTED Final   Pseudomonas aeruginosa NOT DETECTED NOT DETECTED Final   Stenotrophomonas maltophilia NOT DETECTED NOT DETECTED Final   Candida albicans NOT DETECTED NOT DETECTED Final   Candida auris NOT DETECTED NOT DETECTED Final   Candida glabrata NOT DETECTED NOT DETECTED Final   Candida krusei NOT DETECTED NOT DETECTED Final   Candida parapsilosis NOT DETECTED NOT DETECTED Final   Candida tropicalis NOT DETECTED NOT DETECTED Final   Cryptococcus neoformans/gattii NOT DETECTED NOT DETECTED Final   CTX-M ESBL NOT DETECTED NOT DETECTED Final   Carbapenem resistance IMP NOT DETECTED NOT DETECTED Final   Carbapenem resistance KPC NOT DETECTED NOT DETECTED Final   Carbapenem resistance NDM NOT DETECTED NOT DETECTED Final   Carbapenem resist OXA 48 LIKE NOT DETECTED NOT DETECTED Final   Carbapenem resistance VIM NOT DETECTED NOT DETECTED Final    Comment: Performed at [facility], [address] [place], [place] [address]  SARS CORONAVIRUS 2 (TAT 6-24 HRS) Nasopharyngeal Nasopharyngeal Swab     Status: None   Collection Time: 01/07/22  1:10 AM   Specimen: Nasopharyngeal Swab  Result Value Ref Range Status   SARS Coronavirus 2 NEGATIVE NEGATIVE Final    Comment: (NOTE) SARS-CoV-2 target nucleic acids are NOT DETECTED.  The SARS-CoV-2 RNA is generally detectable in upper and lower respiratory specimens during the acute phase of infection. Negative results do not preclude SARS-CoV-2 infection, do not rule out co-infections with other pathogens, and should not be used as the sole basis for  treatment or other patient management decisions. Negative results must be combined with clinical observations, patient history, and epidemiological information. The expected result is Negative.  Fact Sheet for Patients: SugarRoll.be  Fact Sheet for Healthcare Providers: https://www.woods-mathews.com/  This test is not yet approved or cleared by the [place] FDA and  has been authorized for detection and/or diagnosis of SARS-CoV-2 by FDA under an Emergency Use Authorization (EUA). This EUA will remain  in effect (meaning this test can be used) for the duration of the COVID-19 declaration under Se ction 564(b)(1) of the Act, 21 U.S.C. section 360bbb-3(b)(1), unless the authorization is terminated or revoked sooner.  Performed at  [facility], [place] [address] [place],  [address]          Radiology Studies: ECHOCARDIOGRAM COMPLETE  Result Date: 01/06/2022    ECHOCARDIOGRAM REPORT   Patient Name:   Steven Pham Date of Exam: 01/06/2022 Medical Rec #:  [id number]    Height:       63.0 in Accession #:    [id number]   Weight:       234.0 lb Date of Birth:  12-May-1961    BSA:          2.067 m Patient Age:    61 years     BP:           122/76 mmHg Patient Gender: M            HR:           90 bpm. Exam Location:  [facility] Procedure: 2D Echo and Intracardiac Opacification Agent Indications:     Bacteremia R78.81  History:         Patient has prior history of Echocardiogram examinations, most                  recent 12/18/2021.  Sonographer:     Kathlen Brunswick RDCS Referring Phys:  [place] [id number] Diagnosing Phys: Ida Rogue MD  Sonographer Comments: Technically difficult study due to poor echo windows and suboptimal apical window. Image acquisition challenging due to patient body habitus. IMPRESSIONS  1. No valve vegetation noted.  2. Left ventricular ejection fraction, by estimation, is 30 to 35%. The left ventricle has moderately  decreased function. The left ventricle demonstrates global hypokinesis. The left ventricular internal cavity size was mildly to moderately dilated. Left ventricular diastolic parameters are consistent with Grade II diastolic dysfunction (pseudonormalization).  3. Right ventricular systolic function is normal. The right ventricular size is normal. There is normal pulmonary artery systolic pressure. The estimated right ventricular systolic pressure is [id number] mmHg.  4. Left atrial size was moderately dilated.  5. The mitral valve is normal in structure. No evidence of mitral valve regurgitation. No evidence of mitral stenosis.  6. The aortic valve is normal in structure. Aortic valve regurgitation is not visualized. No aortic stenosis is present.  7. The inferior vena cava is normal in size with greater than 50% respiratory variability, suggesting right atrial pressure of 3 mmHg. FINDINGS  Left Ventricle: Left ventricular ejection fraction, by estimation, is 30 to 35%. The left ventricle has moderately decreased function. The left ventricle demonstrates global hypokinesis. Definity contrast agent was given IV to delineate the left ventricular endocardial borders. The left ventricular internal cavity size was mildly to moderately dilated. There is no left ventricular hypertrophy. Left ventricular diastolic parameters are consistent with Grade II diastolic dysfunction (pseudonormalization). Right Ventricle: The right ventricular size is normal. No increase in right ventricular wall thickness. Right ventricular systolic function is normal. There is normal pulmonary artery systolic pressure. The tricuspid regurgitant velocity is 2.46 m/s, and  with an assumed right atrial pressure of 5 mmHg, the estimated right ventricular systolic pressure is [id number] mmHg. Left Atrium: Left atrial size was moderately dilated. Right Atrium: Right atrial size was normal in size. Pericardium: There is no evidence of pericardial effusion. Mitral  Valve: The mitral valve is normal in structure. No evidence of mitral valve regurgitation. No evidence of mitral valve stenosis. Tricuspid Valve: The tricuspid valve is normal in structure. Tricuspid valve regurgitation is mild . No evidence of tricuspid stenosis. Aortic Valve: The  aortic valve is normal in structure. Aortic valve regurgitation is not visualized. No aortic stenosis is present. Aortic valve peak gradient measures 9.1 mmHg. Pulmonic Valve: The pulmonic valve was normal in structure. Pulmonic valve regurgitation is not visualized. No evidence of pulmonic stenosis. Aorta: The aortic root is normal in size and structure. Venous: The inferior vena cava is normal in size with greater than 50% respiratory variability, suggesting right atrial pressure of 3 mmHg. IAS/Shunts: No atrial level shunt detected by color flow Doppler.  LEFT VENTRICLE PLAX 2D LVIDd:         6.20 cm      Diastology LVIDs:         4.75 cm      LV e' medial:    7.72 cm/s LV PW:         1.20 cm      LV E/e' medial:  11.8 LV IVS:        1.00 cm      LV e' lateral:   11.30 cm/s LVOT diam:     2.10 cm      LV E/e' lateral: 8.1 LV SV:         43 LV SV Index:   21 LVOT Area:     3.46 cm  LV Volumes (MOD) LV vol d, MOD A2C: 171.0 ml LV vol d, MOD A4C: 150.0 ml LV vol s, MOD A2C: 99.0 ml LV vol s, MOD A4C: 76.2 ml LV SV MOD A2C:     72.0 ml LV SV MOD A4C:     150.0 ml LV SV MOD BP:      72.1 ml RIGHT VENTRICLE RV Basal diam:  4.10 cm RV S prime:     17.00 cm/s TAPSE (M-mode): 2.3 cm LEFT ATRIUM           Index        RIGHT ATRIUM           Index LA diam:      5.30 cm 2.56 cm/m   RA Area:     23.40 cm LA Vol (A2C): 40.7 ml 19.69 ml/m  RA Volume:   72.70 ml  35.17 ml/m LA Vol (A4C): 56.4 ml 27.28 ml/m  AORTIC VALVE                 PULMONIC VALVE AV Area (Vmax): 1.83 cm     PV Vmax:       1.42 m/s AV Vmax:        151.00 cm/s  PV Peak grad:  8.1 mmHg AV Peak Grad:   9.1 mmHg LVOT Vmax:      79.60 cm/s LVOT Vmean:     54.600 cm/s LVOT VTI:        0.125 m  AORTA Ao Root diam: 3.80 cm Ao Asc diam:  3.70 cm MITRAL VALVE               TRICUSPID VALVE MV Area (PHT): 4.33 cm    TV Peak grad:   24.2 mmHg MV Decel Time: 175 msec    TV Vmax:        2.46 m/s MV E velocity: 91.30 cm/s  TR Peak grad:   24.2 mmHg MV A velocity: 53.10 cm/s  TR Vmax:        246.00 cm/s MV E/A ratio:  1.72  SHUNTS                            Systemic VTI:  0.12 m                            Systemic Diam: 2.10 cm Ida Rogue MD Electronically signed by Ida Rogue MD Signature Date/Time: 01/06/2022/3:30:27 PM    Final         Scheduled Meds:  amiodarone  400 mg Oral BID   heparin  1,200 Units Intravenous Once   Continuous Infusions:  heparin 3,000 Units/hr (01/07/22 [id number])   piperacillin-tazobactam (ZOSYN)  IV 3.375 g (01/07/22 0647)   sodium chloride       LOS: 4 days    Time spent: 25 min    Desma Maxim, MD [facility]   If 7PM-7AM, please contact night-coverage [email or web address] Password [email or web address] 01/07/2022, 1:01 PM

## 2022-01-07 NOTE — Consult Note (Signed)
NAME: Steven Pham  DOB: Feb 23, 1961  MRN: [id number]  Date/Time: 01/07/2022 10:30 AM  REQUESTING PROVIDER: Ashok Pall Subjective:  REASON FOR CONSULT: E.coli bacteremia and septic portal vein thrombosis ? Steven Pham is a 61 y.o. with a history of Htn, GOUT, OA Is admitted on 01/03/22 with palpitations and EKG showing Afib  Multiple hospitalization in the past 3 weeks Initally was in the hospital between 12/16-12/21 for left sided abdominal pain and CT abdomen showed There are multiple fluid-filled outpouchings in the mid jejunum consistent with small bowel diverticular change. Some associated inflammatory changes are noted in the adjacent mesentery with findings suggestive of early venous air related to the inflammatory change. Was seen by surgery. HE also had AKI . He was noted to have intermittent tachycardia and thus had an echocardiogram done which showed an EF of 20 to 25%, grade 3 diastolic dysfunction and mild to moderate mitral regurgitation.was seen by cardiology.  Was treated with Iv ceftriaxone + flagyl and sent home on augmentin for 2 weeks He returned to the ED on 12/27-12/29 with gout flare up and found to be hypotensive. CT abdomen showed Increase in the amount of gas within the SMV and main portal vein with findings concerning for thrombus extending from the main portal vein into the left portal vein.vascular consult no intervention-  Eliquis was started. He was treated for gout flare up with steroids and discharged- augmentin was stopped.  He saw Dr.Arida on 01/03/22 in his office and was noted to be in new Afib and asked to go to ED  In the ED  BP 88/73, HR 64, temp 97.7 sats 98% Wbc 19.7, Hb 12.1, PLT 253, cr 1.53 Ct abdomen Known thrombus again identified in the main portal vein. Thrombus is identified in the superior mesenteric vein, extending into the portal splenic confluence up the portal vein and into the left and right branches of the portal venous anatomy. The patient had gas in  the portal vein on the previous study but this is progressive in the interval with gas now seen in both the right and left portal vein, main portal vein, and SMV. There is some gas in small mesenteric venous anatomy of the left abdomen (51/2) although no small bowel wall thickening or evidence for pneumatosis. Central mesentery in the left abdomen is mildly congested.  Pt developed fever on 01/05/22 and blood culture sent and started on IV ceftriaxone- Blood culture BCID  was e.coli and streptococcus I am asked to see the patient for the same      Past Medical History:  Diagnosis Date   Arthritis    Cardiomyopathy (HCC)    a. 11/2021 Echo: EF 20-25%, glob HK. GrIII DD. Nl RV size/fxn. Mildly elev PASP. Sev dil LA, mildly dil RA. Mild to mod MR.   Gout    Hypertension    NSVT (nonsustained ventricular tachycardia) 11/2021   PSVT (paroxysmal supraventricular tachycardia) (HCC) 11/2021    History reviewed. No pertinent surgical history.  Social History   Socioeconomic History   Marital status: Single    Spouse name: Not on file   Number of children: Not on file   Years of education: Not on file   Highest education level: Not on file  Occupational History   Not on file  Tobacco Use   Smoking status: Never   Smokeless tobacco: Never  Vaping Use   Vaping Use: Never used  Substance and Sexual Activity   Alcohol use: No   Drug use: No  Sexual activity: Not on file  Other Topics Concern   Not on file  Social History Narrative   Lives locally by himself.  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  Does not routinely exercise.   Social Determinants of Health   Financial Resource Strain: Not on file  Food Insecurity: Not on file  Transportation Needs: Not on file  Physical Activity: Not on file  Stress: Not on file  Social Connections: Not on file  Intimate Partner Violence: Not on file    Family History  Family history unknown: Yes   No Known Allergies I? Current  Facility-Administered Medications  Medication Dose Route Frequency Provider Last Rate Last Admin   acetaminophen (TYLENOL) tablet 650 mg  650 mg Oral Q4H PRN Gwynne Edinger, MD   650 mg at 01/07/22 [id number]   amiodarone (PACERONE) tablet 400 mg  400 mg Oral BID Christell Faith M, PA-C   400 mg at 01/07/22 [id number]   heparin ADULT infusion 100 units/mL (25000 units/236mL)  3,000 Units/hr Intravenous Continuous Dorothe Pea, RPH 30 mL/hr at 01/07/22 [id number] 3,000 Units/hr at 01/07/22 [id number]   magnesium hydroxide (MILK OF MAGNESIA) suspension 30 mL  30 mL Oral Daily PRN Mansy, Jan A, MD   30 mL at 01/06/22 1725   ondansetron (ZOFRAN) injection 4 mg  4 mg Intravenous Q6H PRN Mansy, Jan A, MD       piperacillin-tazobactam (ZOSYN) IVPB 3.375 g  3.375 g Intravenous Q8H Tawnya Crook, RPH 12.5 mL/hr at 01/07/22 0647 3.375 g at 01/07/22 [id number]   traZODone (DESYREL) tablet 25 mg  25 mg Oral QHS PRN Mansy, Jan A, MD         Abtx:  Anti-infectives (From admission, onward)    Start     Dose/Rate Route Frequency Ordered Stop   01/06/22 1400  piperacillin-tazobactam (ZOSYN) IVPB 3.375 g        3.375 g 12.5 mL/hr over 240 Minutes Intravenous Every 8 hours 01/06/22 1303     01/05/22 1900  cefTRIAXone (ROCEPHIN) 2 g in sodium chloride 0.9 % 100 mL IVPB  Status:  Discontinued        2 g 200 mL/hr over 30 Minutes Intravenous Every 24 hours 01/05/22 1810 01/06/22 1300       REVIEW OF SYSTEMS:  Const:  fever,  chills, negative weight loss Eyes: negative diplopia or visual changes, negative eye pain ENT: negative coryza, negative sore throat Resp: negative cough, hemoptysis, has dyspnea Cards:  chest pain, palpitations, no lower extremity edema GU: negative for frequency, dysuria and hematuria GI: Negative for abdominal pain, diarrhea, bleeding, constipation Skin: negative for rash and pruritus Heme: negative for easy bruising and gum/nose bleeding MS: general weakness Neurolo:negative for headaches, dizziness,  vertigo, memory problems  Psych: negative for feelings of anxiety, depression  Endocrine: negative for thyroid, diabetes Allergy/Immunology- negative for any medication or food allergies ? Objective:  VITALS:  BP 109/70 (BP Location: Right Arm)    Pulse 87    Temp 99.5 F (37.5 C) (Oral)    Resp (!) 27    Ht 5\' 3"  (1.6 m)    Wt 106.1 kg    SpO2 96%    BMI 41.45 kg/m  PHYSICAL EXAM:  General: Alert, cooperative, no distress, appears stated age.  Head: Normocephalic, without obvious abnormality, atraumatic. Eyes: Conjunctivae clear, anicteric sclerae. Pupils are equal ENT Nares normal. No drainage or sinus tenderness. Lips, mucosa, and tongue normal. No Thrush Neck: Supple, symmetrical, no adenopathy, thyroid: non  tender no carotid bruit and no JVD. Back: No CVA tenderness. Lungs: Clear to auscultation bilaterally. No Wheezing or Rhonchi. No rales. Heart: irregular well controlled Abdomen: Soft, obese. Bowel sounds normal. No masses Extremities: atraumatic, no cyanosis. No edema. No clubbing Skin: No rashes or lesions. Or bruising Lymph: Cervical, supraclavicular normal. Neurologic: Grossly non-focal Pertinent Labs Lab Results CBC    Component Value Date/Time   WBC 15.8 (H) 01/07/2022 0542   RBC 3.08 (L) 01/07/2022 0542   HGB 7.7 (L) 01/07/2022 0542   HCT 22.1 (L) 01/07/2022 0542   PLT 248 01/07/2022 0542   MCV 71.8 (L) 01/07/2022 0542   MCH 25.0 (L) 01/07/2022 0542   MCHC 34.8 01/07/2022 0542   RDW 16.2 (H) 01/07/2022 0542   LYMPHSABS 0.4 (L) 12/14/2021 2010   MONOABS 0.2 12/14/2021 2010   EOSABS 0.0 12/14/2021 2010   BASOSABS 0.0 12/14/2021 2010    CMP Latest Ref Rng & Units 01/07/2022 01/06/2022 01/05/2022  Glucose 70 - 99 mg/dL 114(H) 117(H) 109(H)  BUN 6 - 20 mg/dL 13 15 17   Creatinine 0.61 - 1.24 mg/dL 1.33(H) 1.37(H) 1.34(H)  Sodium 135 - 145 mmol/L 134(L) 134(L) 136  Potassium 3.5 - 5.1 mmol/L 4.0 4.0 4.1  Chloride 98 - 111 mmol/L 105 106 106  CO2 22 - 32 mmol/L  24 21(L) 22  Calcium 8.9 - 10.3 mg/dL 7.7(L) 7.7(L) 7.8(L)  Total Protein 6.5 - 8.1 g/dL - - -  Total Bilirubin 0.3 - 1.2 mg/dL - - -  Alkaline Phos 38 - 126 U/L - - -  AST 15 - 41 U/L - - -  ALT 0 - 44 U/L - - -      Microbiology: Recent Results (from the past 240 hour(s))  Resp Panel by RT-PCR (Flu A&B, Covid) Nasopharyngeal Swab     Status: None   Collection Time: 01/03/22  9:25 AM   Specimen: Nasopharyngeal Swab; Nasopharyngeal(NP) swabs in vial transport medium  Result Value Ref Range Status   SARS Coronavirus 2 by RT PCR NEGATIVE NEGATIVE Final    Comment: (NOTE) SARS-CoV-2 target nucleic acids are NOT DETECTED.  The SARS-CoV-2 RNA is generally detectable in upper respiratory specimens during the acute phase of infection. The lowest concentration of SARS-CoV-2 viral copies this assay can detect is 138 copies/mL. A negative result does not preclude SARS-Cov-2 infection and should not be used as the sole basis for treatment or other patient management decisions. A negative result may occur with  improper specimen collection/handling, submission of specimen other than nasopharyngeal swab, presence of viral mutation(s) within the areas targeted by this assay, and inadequate number of viral copies(<138 copies/mL). A negative result must be combined with clinical observations, patient history, and epidemiological information. The expected result is Negative.  Fact Sheet for Patients:  EntrepreneurPulse.com.au  Fact Sheet for Healthcare Providers:  IncredibleEmployment.be  This test is no t yet approved or cleared by the [place] FDA and  has been authorized for detection and/or diagnosis of SARS-CoV-2 by FDA under an Emergency Use Authorization (EUA). This EUA will remain  in effect (meaning this test can be used) for the duration of the COVID-19 declaration under Section 564(b)(1) of the Act, 21 U.S.C.section 360bbb-3(b)(1), unless  the authorization is terminated  or revoked sooner.       Influenza A by PCR NEGATIVE NEGATIVE Final   Influenza B by PCR NEGATIVE NEGATIVE Final    Comment: (NOTE) The Xpert Xpress SARS-CoV-2/FLU/RSV plus assay is intended as an aid in the  diagnosis of influenza from Nasopharyngeal swab specimens and should not be used as a sole basis for treatment. Nasal washings and aspirates are unacceptable for Xpert Xpress SARS-CoV-2/FLU/RSV testing.  Fact Sheet for Patients: EntrepreneurPulse.com.au  Fact Sheet for Healthcare Providers: IncredibleEmployment.be  This test is not yet approved or cleared by the [place] FDA and has been authorized for detection and/or diagnosis of SARS-CoV-2 by FDA under an Emergency Use Authorization (EUA). This EUA will remain in effect (meaning this test can be used) for the duration of the COVID-19 declaration under Section 564(b)(1) of the Act, 21 U.S.C. section 360bbb-3(b)(1), unless the authorization is terminated or revoked.  Performed at [facility], [address] [place], [place] [address]   Culture, blood (Routine X 2) w Reflex to ID Panel     Status: Abnormal (Preliminary result)   Collection Time: 01/05/22  6:05 AM   Specimen: BLOOD  Result Value Ref Range Status   Specimen Description   Final    BLOOD BLOOD RIGHT HAND Performed at [facility], [address] [place], [place] [address]    Special Requests   Final    BOTTLES DRAWN AEROBIC AND ANAEROBIC Blood Culture adequate volume Performed at [facility], [address] [place], [place] [address]    Culture  Setup Time   Final    Organism ID to follow GRAM NEGATIVE RODS IN BOTH AEROBIC AND ANAEROBIC BOTTLES CRITICAL RESULT CALLED TO, READ BACK BY AND VERIFIED WITH: ABBY ELLINGTON ON 01/05/22 AT 1736 QSD    Culture (A)  Final    ESCHERICHIA COLI SUSCEPTIBILITIES TO FOLLOW CULTURE REINCUBATED FOR BETTER  GROWTH Performed at [facility], [place] [address], [place],  [address]    Report Status PENDING  Incomplete  Blood Culture ID Panel (Reflexed)     Status: Abnormal   Collection Time: 01/05/22  6:05 AM  Result Value Ref Range Status   Enterococcus faecalis NOT DETECTED NOT DETECTED Final   Enterococcus Faecium NOT DETECTED NOT DETECTED Final   Listeria monocytogenes NOT DETECTED NOT DETECTED Final   Staphylococcus species NOT DETECTED NOT DETECTED Final   Staphylococcus aureus (BCID) NOT DETECTED NOT DETECTED Final   Staphylococcus epidermidis NOT DETECTED NOT DETECTED Final   Staphylococcus lugdunensis NOT DETECTED NOT DETECTED Final   Streptococcus species DETECTED (A) NOT DETECTED Final    Comment: Not Enterococcus species, Streptococcus agalactiae, Streptococcus pyogenes, or Streptococcus pneumoniae. CRITICAL RESULT CALLED TO, READ BACK BY AND VERIFIED WITH: ABBY ELLINGTON ON 01/05/22 AT 1736 QSD    Streptococcus agalactiae NOT DETECTED NOT DETECTED Final   Streptococcus pneumoniae NOT DETECTED NOT DETECTED Final   Streptococcus pyogenes NOT DETECTED NOT DETECTED Final   A.calcoaceticus-baumannii NOT DETECTED NOT DETECTED Final   Bacteroides fragilis NOT DETECTED NOT DETECTED Final   Enterobacterales DETECTED (A) NOT DETECTED Final    Comment: Enterobacterales represent a large order of gram negative bacteria, not a single organism. CRITICAL RESULT CALLED TO, READ BACK BY AND VERIFIED WITH: ABBY ELLINGTON ON 01/05/22 AT 1736 QSD    Enterobacter cloacae complex NOT DETECTED NOT DETECTED Final   Escherichia coli DETECTED (A) NOT DETECTED Final    Comment: CRITICAL RESULT CALLED TO, READ BACK BY AND VERIFIED WITH: ABBY ELLINGTON ON 01/05/22 AT 1736 QSD    Klebsiella aerogenes NOT DETECTED NOT DETECTED Final   Klebsiella oxytoca NOT DETECTED NOT DETECTED Final   Klebsiella pneumoniae NOT DETECTED NOT DETECTED Final   Proteus species NOT DETECTED NOT DETECTED Final  Salmonella species NOT DETECTED NOT DETECTED Final   Serratia marcescens NOT DETECTED NOT DETECTED Final   Haemophilus influenzae NOT DETECTED NOT DETECTED Final   Neisseria meningitidis NOT DETECTED NOT DETECTED Final   Pseudomonas aeruginosa NOT DETECTED NOT DETECTED Final   Stenotrophomonas maltophilia NOT DETECTED NOT DETECTED Final   Candida albicans NOT DETECTED NOT DETECTED Final   Candida auris NOT DETECTED NOT DETECTED Final   Candida glabrata NOT DETECTED NOT DETECTED Final   Candida krusei NOT DETECTED NOT DETECTED Final   Candida parapsilosis NOT DETECTED NOT DETECTED Final   Candida tropicalis NOT DETECTED NOT DETECTED Final   Cryptococcus neoformans/gattii NOT DETECTED NOT DETECTED Final   CTX-M ESBL NOT DETECTED NOT DETECTED Final   Carbapenem resistance IMP NOT DETECTED NOT DETECTED Final   Carbapenem resistance KPC NOT DETECTED NOT DETECTED Final   Carbapenem resistance NDM NOT DETECTED NOT DETECTED Final   Carbapenem resist OXA 48 LIKE NOT DETECTED NOT DETECTED Final   Carbapenem resistance VIM NOT DETECTED NOT DETECTED Final    Comment: Performed at [facility], [address] [place], [place] [address]    IMAGING RESULTS: CXR- N   Thrombus again identified in the portal vein with involvement of the superior mesenteric vein and left/right intrahepatic portal venous anatomy. Small foci of peripheral mesenteric venous gas noted in the left abdomen with interval increase in volume of portal venous gas since prior study of 12/26/2021. Portal venous gas can be seen in the setting of bowel ischemia although no evidence for large or small bowel pneumatosis at this time and no small bowel wall thickening. Gas can also be seen in the setting of superinfection. No interloop mesenteric fluid. No intraperitoneal free fluid. I have personally reviewed the films ? Impression/Recommendation ? Polymicrobial bacteremia source GI tract E.coli and streptococcus in  blood culture Jejunal diverticulitis with portal vein thrombosis and gas in the portal vein due to the above infection Septic Pylephlebitis of portal vein, splenic, superior mesenteric vein Watch for mesenteric ischemia/SB ischemia Currently on Iv ceftriaxone- change to zosyn to treat for anerobes and enterococcus If e.coli susceptible to ampicillin then can switch to unasyn Will need IV antibiotics for 4 weeks  Cardiomyopathy with EF 30-35%  Afib- new onset rate controlled- on heparin, amiodarone  ?Anemia  CKD  H/o GOUT-  ? ___________________________________________________ Discussed with patient, requesting provider Note:  This document was prepared using Dragon voice recognition software and may include unintentional dictation errors.

## 2022-01-07 NOTE — Progress Notes (Signed)
Patient refusing to submit COVID sample stating that he has had all shots including the booster. Will encourage to comply.

## 2022-01-07 NOTE — Progress Notes (Signed)
ANTICOAGULATION CONSULT NOTE  Pharmacy Consult for heparin Indication: atrial fibrillation  No Known Allergies  Patient Measurements: Height: 5\' 3"  (160 cm) Weight: 106.1 kg (234 lb) IBW/kg (Calculated) : 56.9 Heparin Dosing Weight: 81 kg  Vital Signs: Temp: 97.9 F (36.6 C) (01/09 0400) Temp Source: Oral (01/09 0400) BP: 98/54 (01/09 0400) Pulse Rate: 74 (01/09 0400)  Labs: Recent Labs    01/05/22 0056 01/05/22 0604 01/05/22 0841 01/05/22 1255 01/05/22 1500 01/05/22 2145 01/06/22 0436 01/06/22 1334 01/06/22 2113 01/07/22 0542  HGB  --  8.3*  --    < >  --   --  8.1*  --   --  7.7*  HCT  --  24.1*  --   --   --   --  23.0*  --   --  22.1*  PLT  --  198  --   --   --   --  216  --   --  248  APTT  --   --  48*  --  57* 58*  --   --   --   --   HEPARINUNFRC   < >  --  0.34  --  0.33 0.37 0.30 0.23* 0.16* 0.31  CREATININE  --  1.34*  --   --   --   --  1.37*  --   --  1.33*   < > = values in this interval not displayed.     Estimated Creatinine Clearance: 64 mL/min (A) (by C-G formula based on SCr of 1.33 mg/dL (H)).   Medical History: Past Medical History:  Diagnosis Date   Arthritis    Cardiomyopathy ([place])    a. 11/2021 Echo: EF 20-25%, glob HK. GrIII DD. Nl RV size/fxn. Mildly elev PASP. Sev dil LA, mildly dil RA. Mild to mod MR.   Gout    Hypertension    NSVT (nonsustained ventricular tachycardia) 11/2021   PSVT (paroxysmal supraventricular tachycardia) ([place]) 11/2021    Medications:  Eliquis PTA  Assessment: 61 year old male with new diagnosis of atrial fibrillation at Cardiology office this morning. He is on Eliquis for recent diagnosis of portal vein thrombosis during hospitalization in December. Pharmacy consulted for heparin management in anticipation for need of right and left cardiac catheterization.  Baseline: aPPT/HL/INR never collected   01/04/22 0550 aPTT 40 sec, HL > 1.10  SUBtherapeutic  01/04/22 1330 aPTT 48 sec,  SUBtherapeutic 01/05/22  0056 aPTT 52 sec, SUBtherapeutic  01/05/22 0841 aPTT 48 sec, HL 0.34   01/05/22 1500 aPTT 57 sec, SUBtherapeutic 01/05/22 2145 aPTT 58 sec, HL 0.37 therapeutic  01/06/22 0436 HL 0.30 Hgb 11.0 >> 8.1 01/06/22 1334 HL 0.23  01/06/22 2113 HL 0.16  01/07/22 0542 HL 0.31, Hgb 11.0 >> 7.7  Goal of Therapy:  Heparin level 0.3-0.7 units/ml aPTT 66-102 seconds Monitor platelets by anticoagulation protocol: Yes   Plan:  Heparin level is therapeutic. Continue heparin infusion at 3000 units/hr. Recheck heparin level in 6 hours. Hemoglobin continues to trend down. Monitor for signs/symptoms of bleeding. CBC daily while on heparin.    Dorothe Pea, PharmD, BCPS Clinical Pharmacist   01/07/2022 6:14 AM

## 2022-01-07 NOTE — Progress Notes (Signed)
Progress Note  Patient Name: Steven Pham Date of Encounter: 01/07/2022  Primary Cardiologist: Fletcher Anon  Subjective   No chest pain, dyspnea, palpitations, dizziness, presyncope, or syncope.   Inpatient Medications    Scheduled Meds:  amiodarone  400 mg Oral BID   Continuous Infusions:  heparin 3,000 Units/hr (01/07/22 [id number])   piperacillin-tazobactam (ZOSYN)  IV 3.375 g (01/07/22 0647)   PRN Meds: acetaminophen, magnesium hydroxide, ondansetron (ZOFRAN) IV, traZODone   Vital Signs    Vitals:   01/06/22 1829 01/06/22 2030 01/07/22 0030 01/07/22 0400  BP: 96/63 (!) 98/57 100/63 (!) 98/54  Pulse: 92 84 94 74  Resp: (!) 24 20 (!) 21 (!) 25  Temp: (!) 100.8 F (38.2 C) (!) 100.9 F (38.3 C) (!) 102.1 F (38.9 C) 97.9 F (36.6 C)  TempSrc: Oral Oral Oral Oral  SpO2: 97% 97% 97% 95%  Weight:      Height:        Intake/Output Summary (Last 24 hours) at 01/07/2022 0826 Last data filed at 01/07/2022 [id number] Gross per 24 hour  Intake 2006.74 ml  Output --  Net 2006.74 ml   Filed Weights   01/03/22 1200  Weight: 106.1 kg    Telemetry    SR with short episode of Afib with RVR overnight - Personally Reviewed  ECG    No new tracings - Personally Reviewed  Physical Exam   GEN: No acute distress.   Neck: No JVD. Cardiac: RRR, no murmurs, rubs, or gallops.  Respiratory: Clear to auscultation bilaterally.  GI: Soft, nontender, non-distended.   MS: No edema; No deformity. Neuro:  Alert and oriented x 3; Nonfocal.  Psych: Normal affect.  Labs    Chemistry Recent Labs  Lab 01/05/22 0604 01/06/22 0436 01/07/22 0542  NA 136 134* 134*  K 4.1 4.0 4.0  CL 106 106 105  CO2 22 21* 24  GLUCOSE 109* 117* 114*  BUN 17 15 13   CREATININE 1.34* 1.37* 1.33*  CALCIUM 7.8* 7.7* 7.7*  GFRNONAA >60 59* >60  ANIONGAP 8 7 5      Hematology Recent Labs  Lab 01/05/22 0604 01/05/22 1255 01/06/22 0436 01/07/22 0542  WBC 20.5*  --  19.1* 15.8*  RBC 3.37*  --  3.26* 3.08*   HGB 8.3* 8.9* 8.1* 7.7*  HCT 24.1*  --  23.0* 22.1*  MCV 71.5*  --  70.6* 71.8*  MCH 24.6*  --  24.8* 25.0*  MCHC 34.4  --  35.2 34.8  RDW 15.8*  --  15.8* 16.2*  PLT 198  --  216 248    Cardiac EnzymesNo results for input(s): TROPONINI in the last 168 hours. No results for input(s): TROPIPOC in the last 168 hours.   BNP Recent Labs  Lab 01/04/22 0550  BNP 450.2*     DDimer No results for input(s): DDIMER in the last 168 hours.   Radiology    CT ABDOMEN PELVIS W CONTRAST  Result Date: 01/05/2022 IMPRESSION: 1. Thrombus again identified in the portal vein with involvement of the superior mesenteric vein and left/right intrahepatic portal venous anatomy. Small foci of peripheral mesenteric venous gas noted in the left abdomen with interval increase in volume of portal venous gas since prior study of 12/26/2021. Portal venous gas can be seen in the setting of bowel ischemia although no evidence for large or small bowel pneumatosis at this time and no small bowel wall thickening. Gas can also be seen in the setting of superinfection. No interloop mesenteric fluid.  No intraperitoneal free fluid. 2. Ill-defined gallbladder wall with apparent mild pericholecystic edema. No biliary duct dilatation. If there is clinical concern for acute cholecystitis, right upper quadrant ultrasound could be used to further evaluate. 3. Left renal cysts. 4. Aortic Atherosclerosis (ICD10-I70.0). Electronically Signed   By: Misty Stanley M.D.   On: 01/05/2022 10:47   Cardiac Studies   2D echo 01/06/2022: 1. No valve vegetation noted.   2. Left ventricular ejection fraction, by estimation, is 30 to 35%. The  left ventricle has moderately decreased function. The left ventricle  demonstrates global hypokinesis. The left ventricular internal cavity size  was mildly to moderately dilated.  Left ventricular diastolic parameters are consistent with Grade II  diastolic dysfunction (pseudonormalization).   3. Right  ventricular systolic function is normal. The right ventricular  size is normal. There is normal pulmonary artery systolic pressure. The  estimated right ventricular systolic pressure is [id number] mmHg.   4. Left atrial size was moderately dilated.   5. The mitral valve is normal in structure. No evidence of mitral valve  regurgitation. No evidence of mitral stenosis.   6. The aortic valve is normal in structure. Aortic valve regurgitation is  not visualized. No aortic stenosis is present.   7. The inferior vena cava is normal in size with greater than 50%  respiratory variability, suggesting right atrial pressure of 3 mmHg. ___________  2D echo 12/18/2021: 1. Left ventricular ejection fraction, by estimation, is 20 to 25%. The  left ventricle has severely decreased function. The left ventricle  demonstrates global hypokinesis. The left ventricular internal cavity size  was mildly dilated. There is mild left  ventricular hypertrophy. Left ventricular diastolic parameters are  consistent with Grade III diastolic dysfunction (restrictive).   2. Right ventricular systolic function is normal. The right ventricular  size is normal. There is mildly elevated pulmonary artery systolic  pressure.   3. Left atrial size was severely dilated.   4. Right atrial size was mildly dilated.   5. The mitral valve is normal in structure. Mild to moderate mitral valve  regurgitation. No evidence of mitral stenosis.   6. The aortic valve is normal in structure. Aortic valve regurgitation is  not visualized. No aortic stenosis is present.   7. The inferior vena cava is normal in size with greater than 50%  respiratory variability, suggesting right atrial pressure of 3 mmHg.  Patient Profile     61 y.o. male with history of HFrEF, HTN, obesity, anemia, and OSA who is being seen today for the evaluation of Afib with RVR and HFrEF.  Assessment & Plan    1. Newly diagnosed Afib/flutter with RVR: -Maintaining  sinus rhythm with oral amiodarone -Relative hypotension precludes beta blocker at this time -CHADS2VASc at least 3 -IV heparin for now -PTA DOAC at discharge   2. HFrEF: -Appears euvolemic and well compensated  -Relative hypotension precludes escalation of GDMT at this time, start as able -R/LHC initially scheduled for today, though in light of the patient developing bacteremia, this will need to be postponed  -Revisit R/LHC once his infection is cleared up  3. 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 Bacteremia: -Blood cultures growing Streptococcus and Enterobacterales species  -Management per IM/ID -Surface echo this admission without definitive valvular vegetation       For questions or updates, please contact  [facility] Please consult [email or web address] for contact info under Cardiology/STEMI.    Signed, Christell Faith, PA-C [place] Pager: [phone] 01/07/2022, 8:26 AM

## 2022-01-07 NOTE — Progress Notes (Signed)
ANTICOAGULATION CONSULT NOTE  Pharmacy Consult for heparin transition to enoxaparin Indication: atrial fibrillation  No Known Allergies  Patient Measurements: Height: 5\' 3"  (160 cm) Weight: 106.1 kg (234 lb) IBW/kg (Calculated) : 56.9 Heparin Dosing Weight: 81 kg  Vital Signs: Temp: 101.4 F (38.6 C) (01/09 1229) Temp Source: Oral (01/09 1229) BP: 83/53 (01/09 1229) Pulse Rate: 95 (01/09 1229)  Labs: Recent Labs    01/05/22 0604 01/05/22 0841 01/05/22 1500 01/05/22 2145 01/06/22 0436 01/06/22 1334 01/06/22 2113 01/07/22 0542 01/07/22 1146 01/07/22 1529  HGB 8.3*   < >  --   --  8.1*  --   --  7.7*  --   --   HCT 24.1*  --   --   --  23.0*  --   --  22.1*  --   --   PLT 198  --   --   --  216  --   --  248  --  237  APTT  --    < > 57* 58*  --   --   --   --   --  PENDING  LABPROT  --   --   --   --   --   --   --   --   --  PENDING  INR  --   --   --   --   --   --   --   --   --  PENDING  HEPARINUNFRC  --    < > 0.33 0.37 0.30   < > 0.16* 0.31 0.24*  --   CREATININE 1.34*  --   --   --  1.37*  --   --  1.33*  --   --    < > = values in this interval not displayed.     Estimated Creatinine Clearance: 64 mL/min (A) (by C-G formula based on SCr of 1.33 mg/dL (H)).   Medical History: Past Medical History:  Diagnosis Date   Arthritis    Cardiomyopathy (HCC)    a. 11/2021 Echo: EF 20-25%, glob HK. GrIII DD. Nl RV size/fxn. Mildly elev PASP. Sev dil LA, mildly dil RA. Mild to mod MR.   Gout    Hypertension    NSVT (nonsustained ventricular tachycardia) 11/2021   PSVT (paroxysmal supraventricular tachycardia) (HCC) 11/2021    Medications: (Heparin Dosing Weight: 81 kg)  Eliquis PTA  Assessment: 61 year old male with new diagnosis of atrial fibrillation at Cardiology office this morning. He is on Eliquis for recent diagnosis of portal vein thrombosis during hospitalization in December. Pharmacy consulted for heparin management initially in anticipation for need  of right and left cardiac catheterization. Now changing to enoxaparin per vascular surgery consultation.  Baseline: aPPT/HL/INR never collected  Date Time aPTT/HL Rate/Comment 01/04/22 0550 40s / >1.10 SUBtherapeutic  01/04/22 1330 48s / --  SUBtherapeutic 01/05/22 0056 52s / --  SUBtherapeutic  01/05/22 0841 48s / 0.34   01/05/22 1500  57s  SUBtherapeutic 01/05/22 2145 58s / 0.37 Therapeutic; 2450 un/hr 01/06/22 0436      -- / 0.30  Therapeutic; 2450 > 2550 un/hr 01/06/22 1334   --  / 0.23  Subthera; 2550 > 2700 un/hr 01/06/22 2113   --  / 0.16  Subthera; 2700 > 3000 un/hr 01/07/22 0542   --  / 0.31 Therapeutic; 3000 un/hr 01/07/22 1146   --  / 0.24 Subthera; 3000>3150 un/hr ________________________________ 01/07/22 1550     -- / --  Started lovenox subQ 1mg /kg q12 dosing  Hgb 12.1>11.0 >8.9>8.1> 7.7 (continues to downtrend)  Goal of Therapy:  Goal peak anti-xa level: 0.6-1 units/mL (q12 hour regimen) Monitor platelets by anticoagulation protocol: Yes   Plan:  Heparin infusion was stopped at 1534 on 01/07/22. Enoxaparin ordered to start at 1630 Enoxaparin 1mg /kg (105 mg) subQ q12h dosing Plan to check steady-state level drawn 4 hours after the 3rd dose  Notably, hemoglobin continues to trend down. Monitor for signs/symptoms of bleeding. CBC daily while on heparin.    NOTE: Vasc Sx consulted recommending LMWH for ext'd phase anticoagulation at discharge to allow for monitoring of levels.  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  03/07/22, PharmD, MS PGPM Clinical Pharmacist 01/07/2022 3:58 PM,

## 2022-01-07 NOTE — Progress Notes (Signed)
COVID test obtained and sent to Lab.

## 2022-01-07 NOTE — Progress Notes (Addendum)
ANTICOAGULATION CONSULT NOTE  Pharmacy Consult for heparin Indication: atrial fibrillation  No Known Allergies  Patient Measurements: Height: 5\' 3"  (160 cm) Weight: 106.1 kg (234 lb) IBW/kg (Calculated) : 56.9 Heparin Dosing Weight: 81 kg  Vital Signs: Temp: 99.5 F (37.5 C) (01/09 0849) Temp Source: Oral (01/09 0849) BP: 109/70 (01/09 0849) Pulse Rate: 87 (01/09 0849)  Labs: Recent Labs    01/05/22 0056 01/05/22 0604 01/05/22 0841 01/05/22 1255 01/05/22 1500 01/05/22 2145 01/06/22 0436 01/06/22 1334 01/06/22 2113 01/07/22 0542 01/07/22 1146  HGB  --  8.3*  --    < >  --   --  8.1*  --   --  7.7*  --   HCT  --  24.1*  --   --   --   --  23.0*  --   --  22.1*  --   PLT  --  198  --   --   --   --  216  --   --  248  --   APTT  --   --  48*  --  57* 58*  --   --   --   --   --   HEPARINUNFRC   < >  --  0.34  --  0.33 0.37 0.30   < > 0.16* 0.31 0.24*  CREATININE  --  1.34*  --   --   --   --  1.37*  --   --  1.33*  --    < > = values in this interval not displayed.     Estimated Creatinine Clearance: 64 mL/min (A) (by C-G formula based on SCr of 1.33 mg/dL (H)).   Medical History: Past Medical History:  Diagnosis Date   Arthritis    Cardiomyopathy (HCC)    a. 11/2021 Echo: EF 20-25%, glob HK. GrIII DD. Nl RV size/fxn. Mildly elev PASP. Sev dil LA, mildly dil RA. Mild to mod MR.   Gout    Hypertension    NSVT (nonsustained ventricular tachycardia) 11/2021   PSVT (paroxysmal supraventricular tachycardia) (HCC) 11/2021    Medications: (Heparin Dosing Weight: 81 kg)  Eliquis PTA  Assessment: 61 year old male with new diagnosis of atrial fibrillation at Cardiology office this morning. He is on Eliquis for recent diagnosis of portal vein thrombosis during hospitalization in December. Pharmacy consulted for heparin management in anticipation for need of right and left cardiac catheterization.  Baseline: aPPT/HL/INR never collected   Date Time aPTT/HL Rate/Comment 01/04/22 0550 40s / >1.10 SUBtherapeutic  01/04/22 1330 48s / --  SUBtherapeutic 01/05/22 0056 52s / --  SUBtherapeutic  01/05/22 0841 48s / 0.34   01/05/22 1500  57s  SUBtherapeutic 01/05/22 2145 58s / 0.37 Therapeutic; 2450 un/hr 01/06/22 0436      -- / 0.30  Therapeutic; 2450 > 2550 un/hr 01/06/22 1334   --  / 0.23  Subthera; 2550 > 2700 un/hr 01/06/22 2113   --  / 0.16  Subthera; 2700 > 3000 un/hr 01/07/22 0542   --  / 0.31 Therapeutic; 3000 un/hr 01/07/22 1146   --  / 0.24 Subthera; 3000>3150 un/hr  Hgb 12.1>11.0 >8.9>8.1> 7.7 (continues to downtrend)  Goal of Therapy:  Heparin level 0.3-0.7 units/ml aPTT 66-102 seconds Monitor platelets by anticoagulation protocol: Yes   Plan:  Heparin level is subtherapeutic. Bolus 1200 units x1; increase heparin infusion to 3150 un/hr.  Recheck heparin level in 6 hours.  Notably, hemoglobin continues to trend down. Monitor for signs/symptoms of  bleeding. CBC daily while on heparin.    NOTE: Although still responsive to boluses pt appears heparin resistant with daily >>35,000 un/24hrs and borderline therapeutic despite escalating doses. Will discuss alternative anticoagulation strategies with team today. CARDs consulted planning R/LHC then mgmt with a DOAC at discharge.  Vasc Sx consulted recommending LMWH for ext'd phase anticoagulation at discharge to allow for monitoring of levels.  TRH to clarify [facility] plan and d/w Cards. Currently Hep gtt in place of home Eliquis for chronic PVT and NOAF this admit.  Martyn Malay, PharmD, [facility] Clinical Pharmacist 01/07/2022 12:28 PM

## 2022-01-08 DIAGNOSIS — I5020 Unspecified systolic (congestive) heart failure: Secondary | ICD-10-CM

## 2022-01-08 DIAGNOSIS — K751 Phlebitis of portal vein: Secondary | ICD-10-CM | POA: Diagnosis not present

## 2022-01-08 LAB — ABO/RH: ABO/RH(D): B POS

## 2022-01-08 LAB — PREPARE RBC (CROSSMATCH)

## 2022-01-08 LAB — CBC
HCT: 20.2 % — ABNORMAL LOW (ref 39.0–52.0)
Hemoglobin: 6.9 g/dL — ABNORMAL LOW (ref 13.0–17.0)
MCH: 24.4 pg — ABNORMAL LOW (ref 26.0–34.0)
MCHC: 34.2 g/dL (ref 30.0–36.0)
MCV: 71.4 fL — ABNORMAL LOW (ref 80.0–100.0)
Platelets: 264 10*3/uL (ref 150–400)
RBC: 2.83 MIL/uL — ABNORMAL LOW (ref 4.22–5.81)
RDW: 16.1 % — ABNORMAL HIGH (ref 11.5–15.5)
WBC: 18.3 10*3/uL — ABNORMAL HIGH (ref 4.0–10.5)
nRBC: 0 % (ref 0.0–0.2)

## 2022-01-08 LAB — HEPARIN ANTI-XA: Heparin LMW: 0.78 IU/mL

## 2022-01-08 LAB — MAGNESIUM: Magnesium: 1.9 mg/dL (ref 1.7–2.4)

## 2022-01-08 LAB — BASIC METABOLIC PANEL
Anion gap: 7 (ref 5–15)
BUN: 15 mg/dL (ref 6–20)
CO2: 23 mmol/L (ref 22–32)
Calcium: 7.6 mg/dL — ABNORMAL LOW (ref 8.9–10.3)
Chloride: 104 mmol/L (ref 98–111)
Creatinine, Ser: 1.51 mg/dL — ABNORMAL HIGH (ref 0.61–1.24)
GFR, Estimated: 53 mL/min — ABNORMAL LOW (ref >60)
Glucose, Bld: 89 mg/dL (ref 70–99)
Potassium: 3.7 mmol/L (ref 3.5–5.1)
Sodium: 134 mmol/L — ABNORMAL LOW (ref 135–145)

## 2022-01-08 LAB — PHOSPHORUS: Phosphorus: 3.7 mg/dL (ref 2.5–4.6)

## 2022-01-08 MED ORDER — SODIUM CHLORIDE 0.9% IV SOLUTION: Freq: Once | INTRAVENOUS | Status: AC

## 2022-01-08 MED ORDER — SODIUM CHLORIDE 0.9 % IV SOLN
3.0000 g | Freq: Four times a day (QID) | INTRAVENOUS | Status: DC
  Administered 2022-01-08 – 2022-01-11 (×11): 3 g via INTRAVENOUS
  Filled 2022-01-08 (×2): qty 8
  Filled 2022-01-08: qty 3
  Filled 2022-01-08: qty 8
  Filled 2022-01-08 (×2): qty 3
  Filled 2022-01-08 (×3): qty 8
  Filled 2022-01-08: qty 3
  Filled 2022-01-08 (×2): qty 8
  Filled 2022-01-08 (×2): qty 3

## 2022-01-08 MED ORDER — POLYETHYLENE GLYCOL 3350 17 G PO PACK
17.0000 g | PACK | Freq: Every day | ORAL | Status: DC
  Administered 2022-01-08 – 2022-01-10 (×3): 17 g via ORAL
  Filled 2022-01-08 (×6): qty 1

## 2022-01-08 NOTE — Progress Notes (Signed)
ID Pt says he is feeling okay Still has some upper abdomen discomfort but not much pain BP 99/69 (BP Location: Right Arm)    Pulse 75    Temp 98.5 F (36.9 C)    Resp 16    Ht 5\' 3"  (1.6 m)    Wt 106.1 kg    SpO2 97%    BMI 41.45 kg/m   Awake and alert Getting PRBC Chest b/l air entry Hss1s2 Abd soft Epigastric tenderness CNS non focal  Labs CBC Latest Ref Rng & Units 01/08/2022 01/07/2022 01/07/2022  WBC 4.0 - 10.5 K/uL 18.3(H) - 15.8(H)  Hemoglobin 13.0 - 17.0 g/dL 6.9(L) - 7.7(L)  Hematocrit 39.0 - 52.0 % 20.2(L) - 22.1(L)  Platelets 150 - 400 K/uL 264 237 248    CMP Latest Ref Rng & Units 01/08/2022 01/07/2022 01/06/2022  Glucose 70 - 99 mg/dL 89 114(H) 117(H)  BUN 6 - 20 mg/dL 15 13 15   Creatinine 0.61 - 1.24 mg/dL 1.51(H) 1.33(H) 1.37(H)  Sodium 135 - 145 mmol/L 134(L) 134(L) 134(L)  Potassium 3.5 - 5.1 mmol/L 3.7 4.0 4.0  Chloride 98 - 111 mmol/L 104 105 106  CO2 22 - 32 mmol/L 23 24 21(L)  Calcium 8.9 - 10.3 mg/dL 7.6(L) 7.7(L) 7.7(L)  Total Protein 6.5 - 8.1 g/dL - 6.3(L) -  Total Bilirubin 0.3 - 1.2 mg/dL - 2.4(H) -  Alkaline Phos 38 - 126 U/L - 156(H) -  AST 15 - 41 U/L - 23 -  ALT 0 - 44 U/L - 29 -     Impression/recommendation Impression/Recommendation ? Polymicrobial bacteremia source GI tract E.coli and streptococcus in blood culture Jejunal diverticulitis with portal vein thrombosis and gas in the portal vein due to the above infection Septic Pylephlebitis of portal vein, splenic, superior mesenteric vein Watch for mesenteric ischemia/SB ischemia Currently on  zosyn to treat for anerobes and enterococcus As e.coli susceptible to ampicillin  can switch to unasyn Will need IV antibiotics for 4 weeks- would do ertapenem on discharge   Cardiomyopathy with EF 30-35%   Afib- new onset rate controlled- on heparin, amiodarone   ?Anemia worsening- r/o Gi occult bleeding mesenteric ischemia   CKD   H/o GOUT-   Discussed the management with patient

## 2022-01-08 NOTE — Progress Notes (Signed)
Progress Note  Patient Name: Steven Pham Date of Encounter: 01/08/2022  Primary Cardiologist: Fletcher Anon  Subjective   No chest pain, dyspnea, palpitations, dizziness, presyncope, or syncope. HGB now 6.9 (admission 11). Denies hematochezia or melena with BM. Has been changed from heparin gtt to full dose Lovenox per vascular. Maintaining sinus rhythm. BP remains soft.   Inpatient Medications    Scheduled Meds:  amiodarone  400 mg Oral BID   enoxaparin (LOVENOX) injection  1 mg/kg Subcutaneous Q12H   polyethylene glycol  17 g Oral Daily   Continuous Infusions:  sodium chloride 10 mL/hr at 01/08/22 1009   piperacillin-tazobactam (ZOSYN)  IV Stopped (01/08/22 0948)   PRN Meds: sodium chloride, acetaminophen, magnesium hydroxide, ondansetron (ZOFRAN) IV, traZODone   Vital Signs    Vitals:   01/08/22 0416 01/08/22 0417 01/08/22 0550 01/08/22 0757  BP: 93/61 93/61 95/62  (!) 89/55  Pulse: 69 69 74 72  Resp: 18 20 18    Temp: 98.1 F (36.7 C) 98.1 F (36.7 C) 98 F (36.7 C) 97.9 F (36.6 C)  TempSrc: Oral  Oral   SpO2: 96% 97% 96% 96%  Weight:      Height:        Intake/Output Summary (Last 24 hours) at 01/08/2022 1041 Last data filed at 01/08/2022 1009 Gross per 24 hour  Intake 2197.45 ml  Output 1 ml  Net 2196.45 ml    Filed Weights   01/03/22 1200  Weight: 106.1 kg    Telemetry    SR - Personally Reviewed  ECG    No new tracings - Personally Reviewed  Physical Exam   GEN: No acute distress.   Neck: No JVD. Cardiac: RRR, no murmurs, rubs, or gallops.  Respiratory: Clear to auscultation bilaterally.  GI: Soft, mild tenderness along the epigastric region, non-distended.   MS: No edema; No deformity. Neuro:  Alert and oriented x 3; Nonfocal.  Psych: Normal affect.  Labs    Chemistry Recent Labs  Lab 01/06/22 0436 01/07/22 0542 01/08/22 0605  NA 134* 134* 134*  K 4.0 4.0 3.7  CL 106 105 104  CO2 21* 24 23  GLUCOSE 117* 114* 89  BUN 15 13 15    CREATININE 1.37* 1.33* 1.51*  CALCIUM 7.7* 7.7* 7.6*  PROT  --  6.3*  --   ALBUMIN  --  1.7*  --   AST  --  23  --   ALT  --  29  --   ALKPHOS  --  156*  --   BILITOT  --  2.4*  --   GFRNONAA 59* >60 53*  ANIONGAP 7 5 7       Hematology Recent Labs  Lab 01/06/22 0436 01/07/22 0542 01/07/22 1529 01/08/22 0605  WBC 19.1* 15.8*  --  18.3*  RBC 3.26* 3.08*  --  2.83*  HGB 8.1* 7.7*  --  6.9*  HCT 23.0* 22.1*  --  20.2*  MCV 70.6* 71.8*  --  71.4*  MCH 24.8* 25.0*  --  24.4*  MCHC 35.2 34.8  --  34.2  RDW 15.8* 16.2*  --  16.1*  PLT 216 248 237 264     Cardiac EnzymesNo results for input(s): TROPONINI in the last 168 hours. No results for input(s): TROPIPOC in the last 168 hours.   BNP Recent Labs  Lab 01/04/22 0550  BNP 450.2*      DDimer  Recent Labs  Lab 01/07/22 1529  DDIMER 3.13*      Radiology  CT ABDOMEN PELVIS W CONTRAST  Result Date: 01/05/2022 IMPRESSION: 1. Thrombus again identified in the portal vein with involvement of the superior mesenteric vein and left/right intrahepatic portal venous anatomy. Small foci of peripheral mesenteric venous gas noted in the left abdomen with interval increase in volume of portal venous gas since prior study of 12/26/2021. Portal venous gas can be seen in the setting of bowel ischemia although no evidence for large or small bowel pneumatosis at this time and no small bowel wall thickening. Gas can also be seen in the setting of superinfection. No interloop mesenteric fluid. No intraperitoneal free fluid. 2. Ill-defined gallbladder wall with apparent mild pericholecystic edema. No biliary duct dilatation. If there is clinical concern for acute cholecystitis, right upper quadrant ultrasound could be used to further evaluate. 3. Left renal cysts. 4. Aortic Atherosclerosis (ICD10-I70.0). Electronically Signed   By: Misty Stanley M.D.   On: 01/05/2022 10:47   Cardiac Studies   2D echo 01/06/2022: 1. No valve vegetation  noted.   2. Left ventricular ejection fraction, by estimation, is 30 to 35%. The  left ventricle has moderately decreased function. The left ventricle  demonstrates global hypokinesis. The left ventricular internal cavity size  was mildly to moderately dilated.  Left ventricular diastolic parameters are consistent with Grade II  diastolic dysfunction (pseudonormalization).   3. Right ventricular systolic function is normal. The right ventricular  size is normal. There is normal pulmonary artery systolic pressure. The  estimated right ventricular systolic pressure is [id number] mmHg.   4. Left atrial size was moderately dilated.   5. The mitral valve is normal in structure. No evidence of mitral valve  regurgitation. No evidence of mitral stenosis.   6. The aortic valve is normal in structure. Aortic valve regurgitation is  not visualized. No aortic stenosis is present.   7. The inferior vena cava is normal in size with greater than 50%  respiratory variability, suggesting right atrial pressure of 3 mmHg. ___________  2D echo 12/18/2021: 1. Left ventricular ejection fraction, by estimation, is 20 to 25%. The  left ventricle has severely decreased function. The left ventricle  demonstrates global hypokinesis. The left ventricular internal cavity size  was mildly dilated. There is mild left  ventricular hypertrophy. Left ventricular diastolic parameters are  consistent with Grade III diastolic dysfunction (restrictive).   2. Right ventricular systolic function is normal. The right ventricular  size is normal. There is mildly elevated pulmonary artery systolic  pressure.   3. Left atrial size was severely dilated.   4. Right atrial size was mildly dilated.   5. The mitral valve is normal in structure. Mild to moderate mitral valve  regurgitation. No evidence of mitral stenosis.   6. The aortic valve is normal in structure. Aortic valve regurgitation is  not visualized. No aortic stenosis is  present.   7. The inferior vena cava is normal in size with greater than 50%  respiratory variability, suggesting right atrial pressure of 3 mmHg.  Patient Profile     61 y.o. male with history of HFrEF, HTN, obesity, anemia, and OSA who is being seen today for the evaluation of Afib with RVR and HFrEF.  Assessment & Plan    1. Newly diagnosed Afib/flutter with RVR: -Maintaining sinus rhythm with oral amiodarone -Relative hypotension precludes beta blocker at this time -CHADS2VASc at least 3 -Has been changed from IV heparin to Lovenox per vascular surgery, see recs regarding anemia below  2. HFrEF: -Appears euvolemic and well compensated  -  Relative hypotension precludes escalation of GDMT at this time, start as able -R/LHC initially scheduled for 1/9, though in light of the patient developing bacteremia and progressive anemia, this will need to be postponed  -Revisit R/LHC once his infection and anemia are treated  3. 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 Polymicrobial bacteremia: -Blood cultures growing Streptococcus and E coli  -GI in etiology  -Management per IM/ID  5. Anemia: -HGB has trended from 11 to 6.9 -May need pRBC -Defer continuation of full dose Lovenox to vascular surgery, given portal vein thrombus     For questions or updates, please contact [facility] Please consult [email or web address] for contact info under Cardiology/STEMI.    Signed, Christell Faith, PA-C [facility] Pager: [phone] 01/08/2022, 10:41 AM

## 2022-01-08 NOTE — Progress Notes (Addendum)
PROGRESS NOTE    Steven Pham  [id number] DOB: 18-Jul-1961 DOA: 01/03/2022 PCP: [facility] [place]  Outpatient Specialists: none    Brief Narrative:   35m hx htn, osa not on cpap, two recent hospital admissions first for diverticulitis where found to have systolic heart failure and again for gout flare where found to have portal vein thrombosis, sent in 1/5 from cardiology clinic for a fib with rvr.   Assessment & Plan:   Principal Problem:   Atrial fibrillation with rapid ventricular response (HCC) Active Problems:   Acute combined systolic and diastolic congestive heart failure (HCC)   Portal vein thrombosis   HTN (hypertension)   Long term current use of anticoagulant therapy  # Atrial fibrillation with rapid ventricular response Sinus rhythm restored w/ amio gtt now transitioned to oral. Hypotensive after gtt, that has improved - cardiology following, plan for cath though now potentially on hold 2/2 bacteremia - cont amio - heparin gtt transitioned to lovenox  - further evidence-based therapy per cardiology  # Combined systolic/diastolic heart failure Recent echo with ef 20-25%, grade 3 dd, mild/mod mitral regurg. Here appears mildly volume overload, breathing comfortably on room air - diuresis and therapeutics per cardiology  # Bacteremia # Sepsis Strep and e coli growing in blood culture, could be cause of portal vein thrombosis. Febrile. Sepsis by fever, hypotension. Lactate wnl. TTE w/o vegetations. E coli is pan sensitive - ID consulted - per ID switched abx from ceftriaxone (started 1/7) to zosyn (started 1/8) and now will start unasyn given pan-sensitive e coli (started 1/10)  # OSA Not on cpap - treatment would be helpful  # Portal vein thrombosis, septic Recent diagnosis. Positive blood culture as above. Gen surg evaluated does not think sig bowel ischemia. Etiology likely recent diverticulitis - vascular also following - heparin transition  to lovenox  - outpt vascular surg f/u  # Anemia Hgb 6.9 from 11 on admission, no bleeding, benign abdomen, CT abd/pelvis w/o signs bleeding. Plts stable. DIC panel not strongly suggestive of DIC. 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 No answer when nephew called 1/10  Level of care: progressive Status is: Inpatient  Remains inpatient appropriate because: severity of illness    Consultants:  cardiology  Procedures: none  Antimicrobials:  See above    Subjective: No complaints, no chest pain or palpitations, no abd pain, no n/v/d, no dysuria. One normal BM earlier today  Objective: Vitals:   01/08/22 0417 01/08/22 0550 01/08/22 0757 01/08/22 1154  BP: 93/61 95/62 (!) 89/55 (!) 86/57  Pulse: 69 74 72 72  Resp: 20 18    Temp: 98.1 F (36.7 C) 98 F (36.7 C) 97.9 F (36.6 C) 97.6 F (36.4 C)  TempSrc:  Oral  Oral  SpO2: 97% 96% 96% 98%  Weight:      Height:        Intake/Output Summary (Last 24 hours) at 01/08/2022 1307 Last data filed at 01/08/2022 1009 Gross per 24 hour  Intake 1958.07 ml  Output 1 ml  Net 1957.07 ml   Filed Weights   01/03/22 1200  Weight: 106.1 kg    Examination:  General exam: Appears calm and comfortable  Respiratory system:  Clear to auscultation. Respiratory effort normal. Cardiovascular system: S1 & S2 heard, tachycardic, mild systolic murmur Gastrointestinal system: Abdomen is distended, soft and nontender. No organomegaly or masses felt. Normal bowel sounds heard. Central nervous system: Alert and oriented. No focal neurological deficits. Extremities: Symmetric 5 x 5 power. Skin: No rashes, lesions or ulcers. Pitting edema  knees Psychiatry: Judgement and insight appear normal. Mood & affect appropriate.     Data Reviewed: I have personally reviewed following labs and imaging studies  CBC: Recent Labs  Lab 01/04/22 0550 01/05/22 0604 01/05/22 1255 01/06/22 0436 01/07/22 0542 01/07/22 1529 01/08/22 0605  WBC 17.1* 20.5*  --  19.1* 15.8*  --  18.3*  HGB 11.0* 8.3* 8.9* 8.1* 7.7*  --  6.9*  HCT 32.4* 24.1*  --  23.0* 22.1*  --  20.2*  MCV 73.8* 71.5*  --  70.6* 71.8*  --  71.4*  PLT 225 198  --  216 248 237 [id number]   Basic Metabolic Panel: Recent Labs  Lab 01/04/22 0550 01/05/22 0604 01/06/22 0436 01/07/22 0542 01/08/22 0605  NA 136 136 134* 134* 134*  K 4.4 4.1 4.0 4.0 3.7  CL 105 106 106 105 104  CO2 21* 22 21* 24 23  GLUCOSE 107* 109* 117* 114* 89  BUN 28* 17 15 13 15   CREATININE 1.52* 1.34* 1.37* 1.33* 1.51*  CALCIUM 8.2* 7.8* 7.7* 7.7* 7.6*  MG 2.2 1.9 1.9 2.0 1.9  PHOS  --   --   --   --  3.7   GFR: Estimated Creatinine Clearance: 56.4 mL/min (A) (by C-G formula based on SCr of 1.51 mg/dL (H)). Liver Function Tests: Recent Labs  Lab 01/07/22 0542  AST 23  ALT 29  ALKPHOS 156*  BILITOT 2.4*  PROT 6.3*  ALBUMIN 1.7*   No results for input(s): LIPASE, AMYLASE in the last 168 hours. No results for input(s): AMMONIA in the last 168 hours. Coagulation Profile: Recent Labs  Lab 01/04/22 0550 01/07/22 1529  INR 1.2 1.3*   Cardiac Enzymes: No results for input(s): CKTOTAL, CKMB, CKMBINDEX, TROPONINI in the last 168 hours. BNP (last 3 results) No results for input(s): PROBNP in the last 8760 hours. HbA1C: No results for input(s): HGBA1C in the last 72 hours.  CBG: Recent Labs  Lab 01/05/22 1256  GLUCAP 161*   Lipid Profile: No results for input(s): CHOL, HDL, LDLCALC, TRIG, CHOLHDL, LDLDIRECT in the last 72 hours.  Thyroid Function Tests: No results for input(s): TSH, T4TOTAL, FREET4, T3FREE, THYROIDAB in the last 72 hours. Anemia Panel: No results for input(s):  VITAMINB12, FOLATE, FERRITIN, TIBC, IRON, RETICCTPCT in the last 72 hours. Urine analysis:    Component Value Date/Time   COLORURINE AMBER (A) 01/04/2022 0820   APPEARANCEUR CLEAR (A) 01/04/2022 0820   LABSPEC 1.012 01/04/2022 0820   PHURINE 5.0 01/04/2022 0820   GLUCOSEU NEGATIVE 01/04/2022 0820   HGBUR NEGATIVE 01/04/2022 0820   BILIRUBINUR NEGATIVE 01/04/2022 0820   KETONESUR NEGATIVE 01/04/2022 0820   PROTEINUR NEGATIVE 01/04/2022 0820   NITRITE NEGATIVE 01/04/2022 0820   LEUKOCYTESUR NEGATIVE 01/04/2022 0820   Sepsis Labs: @LABRCNTIP (procalcitonin:4,lacticidven:4)  ) Recent Results (from the past 240 hour(s))  Resp Panel by RT-PCR (Flu A&B, Covid) Nasopharyngeal Swab     Status: None   Collection Time: 01/03/22  9:25 AM   Specimen: Nasopharyngeal Swab; Nasopharyngeal(NP) swabs in vial transport medium  Result Value Ref Range Status   SARS Coronavirus 2 by RT PCR NEGATIVE NEGATIVE Final    Comment: (NOTE)  SARS-CoV-2 target nucleic acids are NOT DETECTED.  The SARS-CoV-2 RNA is generally detectable in upper respiratory specimens during the acute phase of infection. The lowest concentration of SARS-CoV-2 viral copies this assay can detect is 138 copies/mL. A negative result does not preclude SARS-Cov-2 infection and should not be used as the sole basis for treatment or other patient management decisions. A negative result may occur with  improper specimen collection/handling, submission of specimen other than nasopharyngeal swab, presence of viral mutation(s) within the areas targeted by this assay, and inadequate number of viral copies(<138 copies/mL). A negative result must be combined with clinical observations, patient history, and epidemiological information. The expected result is Negative.  Fact Sheet for Patients:  EntrepreneurPulse.com.au  Fact Sheet for Healthcare Providers:  IncredibleEmployment.be  This test is no t yet  approved or cleared by the [place] FDA and  has been authorized for detection and/or diagnosis of SARS-CoV-2 by FDA under an Emergency Use Authorization (EUA). This EUA will remain  in effect (meaning this test can be used) for the duration of the COVID-19 declaration under Section 564(b)(1) of the Act, 21 U.S.C.section 360bbb-3(b)(1), unless the authorization is terminated  or revoked sooner.       Influenza A by PCR NEGATIVE NEGATIVE Final   Influenza B by PCR NEGATIVE NEGATIVE Final    Comment: (NOTE) The Xpert Xpress SARS-CoV-2/FLU/RSV plus assay is intended as an aid in the diagnosis of influenza from Nasopharyngeal swab specimens and should not be used as a sole basis for treatment. Nasal washings and aspirates are unacceptable for Xpert Xpress SARS-CoV-2/FLU/RSV testing.  Fact Sheet for Patients: EntrepreneurPulse.com.au  Fact Sheet for Healthcare Providers: IncredibleEmployment.be  This test is not yet approved or cleared by the [place] FDA and has been authorized for detection and/or diagnosis of SARS-CoV-2 by FDA under an Emergency Use Authorization (EUA). This EUA will remain in effect (meaning this test can be used) for the duration of the COVID-19 declaration under Section 564(b)(1) of the Act, 21 U.S.C. section 360bbb-3(b)(1), unless the authorization is terminated or revoked.  Performed at [facility], [address] [place], [place] [address]   Culture, blood (Routine X 2) w Reflex to ID Panel     Status: Abnormal (Preliminary result)   Collection Time: 01/05/22  6:05 AM   Specimen: BLOOD  Result Value Ref Range Status   Specimen Description   Final    BLOOD BLOOD RIGHT HAND Performed at [facility], [address] [place], [place] [address]    Special Requests   Final    BOTTLES DRAWN AEROBIC AND ANAEROBIC Blood Culture adequate volume Performed at [facility], [address] [place], [place] [address]    Culture  Setup Time   Final    Organism ID to follow GRAM NEGATIVE RODS GRAM POSITIVE COCCI IN PAIRS IN BOTH AEROBIC AND ANAEROBIC BOTTLES CRITICAL RESULT CALLED TO, READ BACK BY AND VERIFIED WITH: ABBY ELLINGTON ON 01/05/22 AT 1736 QSD CRITICAL RESULT CALLED TO, READ BACK BY AND VERIFIED WITH: PHARM D L.KLUTZ ON [id number] AT 0948 BY E.PARRISH    Culture (A)  Final    ESCHERICHIA COLI GRAM POSITIVE COCCI CULTURE REINCUBATED FOR BETTER GROWTH Performed at [facility], [address] [place], [place] [address]    Report Status PENDING  Incomplete   Organism ID, Bacteria ESCHERICHIA COLI  Final      Susceptibility   Escherichia coli - MIC*    AMPICILLIN <=2 SENSITIVE Sensitive  CEFAZOLIN <=4 SENSITIVE Sensitive     CEFEPIME <=0.12 SENSITIVE Sensitive     CEFTAZIDIME <=1 SENSITIVE Sensitive     CEFTRIAXONE <=0.25 SENSITIVE Sensitive     CIPROFLOXACIN <=0.25 SENSITIVE Sensitive     GENTAMICIN <=1 SENSITIVE Sensitive     IMIPENEM <=0.25 SENSITIVE Sensitive     TRIMETH/SULFA <=20 SENSITIVE Sensitive     AMPICILLIN/SULBACTAM <=2 SENSITIVE Sensitive     PIP/TAZO <=4 SENSITIVE Sensitive     * ESCHERICHIA COLI  Blood Culture ID Panel (Reflexed)     Status: Abnormal   Collection Time: 01/05/22  6:05 AM  Result Value Ref Range Status   Enterococcus faecalis NOT DETECTED NOT DETECTED Final   Enterococcus Faecium NOT DETECTED NOT DETECTED Final   Listeria monocytogenes NOT DETECTED NOT DETECTED Final   Staphylococcus species NOT DETECTED NOT DETECTED Final   Staphylococcus aureus (BCID) NOT DETECTED NOT DETECTED Final   Staphylococcus epidermidis NOT DETECTED NOT DETECTED Final   Staphylococcus lugdunensis NOT DETECTED NOT DETECTED Final   Streptococcus species DETECTED (A) NOT DETECTED Final    Comment: Not Enterococcus species, Streptococcus agalactiae, Streptococcus pyogenes, or Streptococcus pneumoniae. CRITICAL RESULT CALLED TO, READ BACK BY  AND VERIFIED WITH: ABBY ELLINGTON ON 01/05/22 AT 1736 QSD    Streptococcus agalactiae NOT DETECTED NOT DETECTED Final   Streptococcus pneumoniae NOT DETECTED NOT DETECTED Final   Streptococcus pyogenes NOT DETECTED NOT DETECTED Final   A.calcoaceticus-baumannii NOT DETECTED NOT DETECTED Final   Bacteroides fragilis NOT DETECTED NOT DETECTED Final   Enterobacterales DETECTED (A) NOT DETECTED Final    Comment: Enterobacterales represent a large order of gram negative bacteria, not a single organism. CRITICAL RESULT CALLED TO, READ BACK BY AND VERIFIED WITH: ABBY ELLINGTON ON 01/05/22 AT 1736 QSD    Enterobacter cloacae complex NOT DETECTED NOT DETECTED Final   Escherichia coli DETECTED (A) NOT DETECTED Final    Comment: CRITICAL RESULT CALLED TO, READ BACK BY AND VERIFIED WITH: ABBY ELLINGTON ON 01/05/22 AT 1736 QSD    Klebsiella aerogenes NOT DETECTED NOT DETECTED Final   Klebsiella oxytoca NOT DETECTED NOT DETECTED Final   Klebsiella pneumoniae NOT DETECTED NOT DETECTED Final   Proteus species NOT DETECTED NOT DETECTED Final   Salmonella species NOT DETECTED NOT DETECTED Final   Serratia marcescens NOT DETECTED NOT DETECTED Final   Haemophilus influenzae NOT DETECTED NOT DETECTED Final   Neisseria meningitidis NOT DETECTED NOT DETECTED Final   Pseudomonas aeruginosa NOT DETECTED NOT DETECTED Final   Stenotrophomonas maltophilia NOT DETECTED NOT DETECTED Final   Candida albicans NOT DETECTED NOT DETECTED Final   Candida auris NOT DETECTED NOT DETECTED Final   Candida glabrata NOT DETECTED NOT DETECTED Final   Candida krusei NOT DETECTED NOT DETECTED Final   Candida parapsilosis NOT DETECTED NOT DETECTED Final   Candida tropicalis NOT DETECTED NOT DETECTED Final   Cryptococcus neoformans/gattii NOT DETECTED NOT DETECTED Final   CTX-M ESBL NOT DETECTED NOT DETECTED Final   Carbapenem resistance IMP NOT DETECTED NOT DETECTED Final   Carbapenem resistance KPC NOT DETECTED NOT DETECTED Final    Carbapenem resistance NDM NOT DETECTED NOT DETECTED Final   Carbapenem resist OXA 48 LIKE NOT DETECTED NOT DETECTED Final   Carbapenem resistance VIM NOT DETECTED NOT DETECTED Final    Comment: Performed at [facility], [address] [place], [place] [address]  SARS CORONAVIRUS 2 (TAT 6-24 HRS) Nasopharyngeal Nasopharyngeal Swab     Status: None   Collection Time: 01/07/22  1:10 AM  Specimen: Nasopharyngeal Swab  Result Value Ref Range Status   SARS Coronavirus 2 NEGATIVE NEGATIVE Final    Comment: (NOTE) SARS-CoV-2 target nucleic acids are NOT DETECTED.  The SARS-CoV-2 RNA is generally detectable in upper and lower respiratory specimens during the acute phase of infection. Negative results do not preclude SARS-CoV-2 infection, do not rule out co-infections with other pathogens, and should not be used as the sole basis for treatment or other patient management decisions. Negative results must be combined with clinical observations, patient history, and epidemiological information. The expected result is Negative.  Fact Sheet for Patients: HairSlick.no  Fact Sheet for Healthcare Providers: quierodirigir.com  This test is not yet approved or cleared by the [place] FDA and  has been authorized for detection and/or diagnosis of SARS-CoV-2 by FDA under an Emergency Use Authorization (EUA). This EUA will remain  in effect (meaning this test can be used) for the duration of the COVID-19 declaration under Se ction 564(b)(1) of the Act, 21 U.S.C. section 360bbb-3(b)(1), unless the authorization is terminated or revoked sooner.  Performed at [facility], [address], [place], [place] [address]          Radiology Studies: ECHOCARDIOGRAM COMPLETE  Result Date: 01/06/2022    ECHOCARDIOGRAM REPORT   Patient Name:   Steven Pham Date of Exam: 01/06/2022 Medical Rec #:  [id number]    Height:       63.0 in  Accession #:    [id number]   Weight:       234.0 lb Date of Birth:  [birth year]    BSA:          2.067 m Patient Age:    60 years     BP:           122/76 mmHg Patient Gender: M            HR:           90 bpm. Exam Location:  [facility] Procedure: 2D Echo and Intracardiac Opacification Agent Indications:     Bacteremia R78.81  History:         Patient has prior history of Echocardiogram examinations, most                  recent 12/18/2021.  Sonographer:     Overton Mam RDCS Referring Phys:  [id number] Wilfred Curtis QZRA Diagnosing Phys: Julien Nordmann MD  Sonographer Comments: Technically difficult study due to poor echo windows and suboptimal apical window. Image acquisition challenging due to patient body habitus. IMPRESSIONS  1. No valve vegetation noted.  2. Left ventricular ejection fraction, by estimation, is 30 to 35%. The left ventricle has moderately decreased function. The left ventricle demonstrates global hypokinesis. The left ventricular internal cavity size was mildly to moderately dilated. Left ventricular diastolic parameters are consistent with Grade II diastolic dysfunction (pseudonormalization).  3. Right ventricular systolic function is normal. The right ventricular size is normal. There is normal pulmonary artery systolic pressure. The estimated right ventricular systolic pressure is 29.2 mmHg.  4. Left atrial size was moderately dilated.  5. The mitral valve is normal in structure. No evidence of mitral valve regurgitation. No evidence of mitral stenosis.  6. The aortic valve is normal in structure. Aortic valve regurgitation is not visualized. No aortic stenosis is present.  7. The inferior vena cava is normal in size with greater than 50% respiratory variability, suggesting right atrial pressure of 3 mmHg. FINDINGS  Left Ventricle: Left ventricular ejection fraction, by  estimation, is 30 to 35%. The left ventricle has moderately decreased function. The left ventricle demonstrates global  hypokinesis. Definity contrast agent was given IV to delineate the left ventricular endocardial borders. The left ventricular internal cavity size was mildly to moderately dilated. There is no left ventricular hypertrophy. Left ventricular diastolic parameters are consistent with Grade II diastolic dysfunction (pseudonormalization). Right Ventricle: The right ventricular size is normal. No increase in right ventricular wall thickness. Right ventricular systolic function is normal. There is normal pulmonary artery systolic pressure. The tricuspid regurgitant velocity is 2.46 m/s, and  with an assumed right atrial pressure of 5 mmHg, the estimated right ventricular systolic pressure is [id number] mmHg. Left Atrium: Left atrial size was moderately dilated. Right Atrium: Right atrial size was normal in size. Pericardium: There is no evidence of pericardial effusion. Mitral Valve: The mitral valve is normal in structure. No evidence of mitral valve regurgitation. No evidence of mitral valve stenosis. Tricuspid Valve: The tricuspid valve is normal in structure. Tricuspid valve regurgitation is mild . No evidence of tricuspid stenosis. Aortic Valve: The aortic valve is normal in structure. Aortic valve regurgitation is not visualized. No aortic stenosis is present. Aortic valve peak gradient measures 9.1 mmHg. Pulmonic Valve: The pulmonic valve was normal in structure. Pulmonic valve regurgitation is not visualized. No evidence of pulmonic stenosis. Aorta: The aortic root is normal in size and structure. Venous: The inferior vena cava is normal in size with greater than 50% respiratory variability, suggesting right atrial pressure of 3 mmHg. IAS/Shunts: No atrial level shunt detected by color flow Doppler.  LEFT VENTRICLE PLAX 2D LVIDd:         6.20 cm      Diastology LVIDs:         4.75 cm      LV e' medial:    7.72 cm/s LV PW:         1.20 cm      LV E/e' medial:  11.8 LV IVS:        1.00 cm      LV e' lateral:   11.30 cm/s  LVOT diam:     2.10 cm      LV E/e' lateral: 8.1 LV SV:         43 LV SV Index:   21 LVOT Area:     3.46 cm  LV Volumes (MOD) LV vol d, MOD A2C: 171.0 ml LV vol d, MOD A4C: 150.0 ml LV vol s, MOD A2C: 99.0 ml LV vol s, MOD A4C: 76.2 ml LV SV MOD A2C:     72.0 ml LV SV MOD A4C:     150.0 ml LV SV MOD BP:      72.1 ml RIGHT VENTRICLE RV Basal diam:  4.10 cm RV S prime:     17.00 cm/s TAPSE (M-mode): 2.3 cm LEFT ATRIUM           Index        RIGHT ATRIUM           Index LA diam:      5.30 cm 2.56 cm/m   RA Area:     23.40 cm LA Vol (A2C): 40.7 ml 19.69 ml/m  RA Volume:   72.70 ml  35.17 ml/m LA Vol (A4C): 56.4 ml 27.28 ml/m  AORTIC VALVE                 PULMONIC VALVE AV Area (Vmax): 1.83 cm     PV Vmax:  1.42 m/s AV Vmax:        151.00 cm/s  PV Peak grad:  8.1 mmHg AV Peak Grad:   9.1 mmHg LVOT Vmax:      79.60 cm/s LVOT Vmean:     54.600 cm/s LVOT VTI:       0.125 m  AORTA Ao Root diam: 3.80 cm Ao Asc diam:  3.70 cm MITRAL VALVE               TRICUSPID VALVE MV Area (PHT): 4.33 cm    TV Peak grad:   24.2 mmHg MV Decel Time: 175 msec    TV Vmax:        2.46 m/s MV E velocity: 91.30 cm/s  TR Peak grad:   24.2 mmHg MV A velocity: 53.10 cm/s  TR Vmax:        246.00 cm/s MV E/A ratio:  1.72                            SHUNTS                            Systemic VTI:  0.12 m                            Systemic Diam: 2.10 cm Ida Rogue MD Electronically signed by Ida Rogue MD Signature Date/Time: 01/06/2022/3:30:27 PM    Final         Scheduled Meds:  sodium chloride   Intravenous Once   amiodarone  400 mg Oral BID   enoxaparin (LOVENOX) injection  1 mg/kg Subcutaneous Q12H   polyethylene glycol  17 g Oral Daily   Continuous Infusions:  sodium chloride 10 mL/hr at 01/08/22 1009   piperacillin-tazobactam (ZOSYN)  IV Stopped (01/08/22 0948)     LOS: 5 days    Time spent: 25 min    Desma Maxim, MD [facility]   If 7PM-7AM, please contact  night-coverage [email or web address] Password [email or web address] 01/08/2022, 1:07 PM

## 2022-01-08 NOTE — Progress Notes (Signed)
ANTICOAGULATION CONSULT NOTE  Pharmacy Consult for heparin transition to enoxaparin Indication: atrial fibrillation  No Known Allergies  Patient Measurements: Height: 5\' 3"  (160 cm) Weight: 106.1 kg (234 lb) IBW/kg (Calculated) : 56.9 Heparin Dosing Weight: 81 kg  Vital Signs: Temp: 98.4 F (36.9 C) (01/10 2011) Temp Source: Oral (01/10 2011) BP: 101/64 (01/10 2011) Pulse Rate: 76 (01/10 2011)  Labs: Recent Labs    01/06/22 0436 01/06/22 1334 01/06/22 2113 01/07/22 0542 01/07/22 1146 01/07/22 1529 01/08/22 0605 01/08/22 2145  HGB 8.1*  --   --  7.7*  --   --  6.9*  --   HCT 23.0*  --   --  22.1*  --   --  20.2*  --   PLT 216  --   --  248  --  237 264  --   APTT  --   --   --   --   --  97*  --   --   LABPROT  --   --   --   --   --  16.3*  --   --   INR  --   --   --   --   --  1.3*  --   --   HEPARINUNFRC 0.30   < > 0.16* 0.31 0.24*  --   --   --   HEPRLOWMOCWT  --   --   --   --   --   --   --  0.78  CREATININE 1.37*  --   --  1.33*  --   --  1.51*  --    < > = values in this interval not displayed.     Estimated Creatinine Clearance: 56.4 mL/min (A) (by C-G formula based on SCr of 1.51 mg/dL (H)).   Medical History: Past Medical History:  Diagnosis Date   Arthritis    Cardiomyopathy (HCC)    a. 11/2021 Echo: EF 20-25%, glob HK. GrIII DD. Nl RV size/fxn. Mildly elev PASP. Sev dil LA, mildly dil RA. Mild to mod MR.   Gout    Hypertension    NSVT (nonsustained ventricular tachycardia) 11/2021   PSVT (paroxysmal supraventricular tachycardia) (HCC) 11/2021    Medications: (Heparin Dosing Weight: 81 kg)  Eliquis PTA  Assessment: 61 year old male with new diagnosis of atrial fibrillation at Cardiology office this morning. He is on Eliquis for recent diagnosis of portal vein thrombosis during hospitalization in December. Pharmacy consulted for heparin management initially in anticipation for need of right and left cardiac catheterization. Now changing to  enoxaparin per vascular surgery consultation.  Baseline: aPPT/HL/INR never collected  Date Time aPTT/HL Rate/Comment 01/04/22 0550 40s / >1.10 SUBtherapeutic  01/04/22 1330 48s / --  SUBtherapeutic 01/05/22 0056 52s / --  SUBtherapeutic  01/05/22 0841 48s / 0.34   01/05/22 1500  57s  SUBtherapeutic 01/05/22 2145 58s / 0.37 Therapeutic; 2450 un/hr 01/06/22 0436      -- / 0.30  Therapeutic; 2450 > 2550 un/hr 01/06/22 1334   --  / 0.23  Subthera; 2550 > 2700 un/hr 01/06/22 2113   --  / 0.16  Subthera; 2700 > 3000 un/hr 01/07/22 0542   --  / 0.31 Therapeutic; 3000 un/hr 01/07/22 1146   --  / 0.24 Subthera; 3000>3150 un/hr ________________________________ 01/07/22 1550     -- / --  Started lovenox subQ 1mg /kg q12 dosing  Hgb 12.1>11.0 >8.9>8.1> 7.7 (continues to downtrend)  Goal of Therapy:  Goal peak  anti-xa level: 0.6-1 units/mL (q12 hour regimen) Monitor platelets by anticoagulation protocol: Yes  1/10 2145 LMWH lvl 0.78, therapeutic   Plan:  Continue Enoxaparin 1mg /kg (105 mg) subQ q12h dosing Plan to check steady-state level drawn 4 hours after the 3rd dose  Notably, hemoglobin continues to trend down. Monitor for signs/symptoms of bleeding. CBC at least every 3 days while on enoxaparin. SCr and LMWH at least weekly while on enoxaparin.   NOTE: Vasc Sx consulted recommending LMWH for ext'd phase anticoagulation at discharge to allow for monitoring of levels.  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  , PharmD, [facility] 01/08/2022 10:57 PM

## 2022-01-09 DIAGNOSIS — K5712 Diverticulitis of small intestine without perforation or abscess without bleeding: Secondary | ICD-10-CM | POA: Diagnosis not present

## 2022-01-09 DIAGNOSIS — I81 Portal vein thrombosis: Secondary | ICD-10-CM | POA: Diagnosis not present

## 2022-01-09 DIAGNOSIS — R7881 Bacteremia: Secondary | ICD-10-CM | POA: Diagnosis not present

## 2022-01-09 DIAGNOSIS — I4891 Unspecified atrial fibrillation: Principal | ICD-10-CM

## 2022-01-09 DIAGNOSIS — K751 Phlebitis of portal vein: Secondary | ICD-10-CM | POA: Diagnosis not present

## 2022-01-09 DIAGNOSIS — I5041 Acute combined systolic (congestive) and diastolic (congestive) heart failure: Secondary | ICD-10-CM

## 2022-01-09 LAB — BPAM RBC
Blood Product Expiration Date: 202301212359
ISSUE DATE / TIME: 202301101626
Unit Type and Rh: 7300

## 2022-01-09 LAB — TYPE AND SCREEN
ABO/RH(D): B POS
Antibody Screen: NEGATIVE
Unit division: 0

## 2022-01-09 LAB — CBC
HCT: 23 % — ABNORMAL LOW (ref 39.0–52.0)
Hemoglobin: 7.9 g/dL — ABNORMAL LOW (ref 13.0–17.0)
MCH: 24.8 pg — ABNORMAL LOW (ref 26.0–34.0)
MCHC: 34.3 g/dL (ref 30.0–36.0)
MCV: 72.1 fL — ABNORMAL LOW (ref 80.0–100.0)
Platelets: 329 10*3/uL (ref 150–400)
RBC: 3.19 MIL/uL — ABNORMAL LOW (ref 4.22–5.81)
RDW: 17.2 % — ABNORMAL HIGH (ref 11.5–15.5)
WBC: 15.6 10*3/uL — ABNORMAL HIGH (ref 4.0–10.5)
nRBC: 0 % (ref 0.0–0.2)

## 2022-01-09 NOTE — Progress Notes (Signed)
ID Pt says he is feeling okay No pain abdomen Poor appetite No melena  BP 110/71 (BP Location: Right Arm)    Pulse 71    Temp 99.1 F (37.3 C) (Oral)    Resp 20    Ht 5\' 3"  (1.6 m)    Wt 106.1 kg    SpO2 96%    BMI 41.45 kg/m   Awake and alert Chest b/l air entry [id number] Abd soft Epigastric tenderness CNS non focal  Labs CBC Latest Ref Rng & Units 01/09/2022 01/08/2022 01/07/2022  WBC 4.0 - 10.5 K/uL 15.6(H) 18.3(H) -  Hemoglobin 13.0 - 17.0 g/dL 7.9(L) 6.9(L) -  Hematocrit 39.0 - 52.0 % 23.0(L) 20.2(L) -  Platelets 150 - 400 K/uL 329 264 237    CMP Latest Ref Rng & Units 01/08/2022 01/07/2022 01/06/2022  Glucose 70 - 99 mg/dL 89 114(H) 117(H)  BUN 6 - 20 mg/dL 15 13 15   Creatinine 0.61 - 1.24 mg/dL 1.51(H) 1.33(H) 1.37(H)  Sodium 135 - 145 mmol/L 134(L) 134(L) 134(L)  Potassium 3.5 - 5.1 mmol/L 3.7 4.0 4.0  Chloride 98 - 111 mmol/L 104 105 106  CO2 22 - 32 mmol/L 23 24 21(L)  Calcium 8.9 - 10.3 mg/dL 7.6(L) 7.7(L) 7.7(L)  Total Protein 6.5 - 8.1 g/dL - 6.3(L) -  Total Bilirubin 0.3 - 1.2 mg/dL - 2.4(H) -  Alkaline Phos 38 - 126 U/L - 156(H) -  AST 15 - 41 U/L - 23 -  ALT 0 - 44 U/L - 29 -     Impression/Recommendation ? Polymicrobial bacteremia source GI tract E.coli and streptococcus in blood culture Jejunal diverticulitis with portal vein thrombosis and gas in the portal vein due to the above infection Septic Pylephlebitis of portal vein, splenic, superior mesenteric vein Watch for mesenteric ischemia/SB ischemia Currently on  zosyn to treat for anerobes and enterococcus As e.coli susceptible to ampicillin  can switch to unasyn Will need IV antibiotics for 4 weeks END date  -02/02/22  would do ertapenem  1 gram IV on discharge   Cardiomyopathy with EF 30-35%   Afib- new onset rate controlled- on heparin, amiodarone   ?Anemia worsening- r/o Gi occult bleeding mesenteric ischemia   CKD   H/o GOUT-   Discussed the management with patient

## 2022-01-09 NOTE — Progress Notes (Signed)
PROGRESS NOTE    Steven Pham  [id number] DOB: 1961/08/04 DOA: 01/03/2022 PCP: [facility] [place]     Brief Narrative:  Steven Pham is a 61 y.o. male with medical history significant for  gout, HTN, combined CHF recently diagnosed with EF 20 to 25% and grade 3 DD, who presented to the emergency room with chest pain and a feeling of her heart fluttering.  He was seen by Dr. Fletcher Anon who sent him to the emergency room for atrial fibrillation with rapid ventricular response.  Patient was initially treated with IV amiodarone drip, IV heparin drip.  Cardiology consulted.  Patient was also diagnosed with portal vein thrombosis, vascular surgery consulted.  Also with polymicrobial bacteremia, infectious disease consulted.  New events last 24 hours / Subjective: Patient sitting at side of the bed, eating breakfast.  He has no new complaints today.  Overall feeling well and denies any chest pain, shortness of breath.  Patient denies any hematochezia, melanotic stools, no overt blood loss  Assessment & Plan:   Principal Problem:   Atrial fibrillation with rapid ventricular response (HCC) Active Problems:   Acute combined systolic and diastolic congestive heart failure (HCC)   Portal vein thrombosis   HTN (hypertension)   Long term current use of anticoagulant therapy   HFrEF (heart failure with reduced ejection fraction) (HCC)   A. fib RVR -CHA2DS2-VASc score 3 -Initially treated with amiodarone drip, now on p.o. amiodarone -Remains on Lovenox -Cardiology following  Combined systolic, diastolic heart failure -Echocardiogram with EF 20 to [id number], grade 3 diastolic dysfunction -He will eventually need heart cath  Sepsis with strep and E. coli bacteremia -ID following, remains on Unasyn.  Recommended for 4-week course of ertapenem on discharge -Repeat blood cultures ordered today   Septic portal vein thrombosis -Vascular surgery following -Remains on Lovenox, he will need  outpatient vascular surgery follow-up  Anemia -No source of overt blood loss seen  -Status post 1 unit packed red blood cell -Continue to monitor closely, will need to address outpatient anticoagulation plan -FOBT ordered   CKD stage IIIa -Baseline Cr 1.5   DVT prophylaxis: Lovenox  Code Status: Full code Family Communication: No family at bedside Disposition Plan:  Status is: Inpatient  Remains inpatient appropriate because: Patient on IV antibiotics, continue to monitor trend of hemoglobin, plan for home health on discharge   Antimicrobials:  Anti-infectives (From admission, onward)    Start     Dose/Rate Route Frequency Ordered Stop   01/08/22 1400  Ampicillin-Sulbactam (UNASYN) 3 g in sodium chloride 0.9 % 100 mL IVPB        3 g 200 mL/hr over 30 Minutes Intravenous Every 6 hours 01/08/22 1324     01/06/22 1400  piperacillin-tazobactam (ZOSYN) IVPB 3.375 g  Status:  Discontinued        3.375 g 12.5 mL/hr over 240 Minutes Intravenous Every 8 hours 01/06/22 1303 01/08/22 1312   01/05/22 1900  cefTRIAXone (ROCEPHIN) 2 g in sodium chloride 0.9 % 100 mL IVPB  Status:  Discontinued        2 g 200 mL/hr over 30 Minutes Intravenous Every 24 hours 01/05/22 1810 01/06/22 1300        Objective: Vitals:   01/08/22 2329 01/09/22 0236 01/09/22 0816 01/09/22 1141  BP: 99/69 104/68 110/71 97/63  Pulse: 75 74 71 69  Resp: 16 18 20 16   Temp: 98.5 F (36.9 C) 98 F (36.7 C) 99.1 F (37.3 C) 99.4 F (37.4 C)  TempSrc:  Oral Oral  SpO2: 97% 97% 96% 97%  Weight:      Height:        Intake/Output Summary (Last 24 hours) at 01/09/2022 1148 Last data filed at 01/09/2022 1010 Gross per 24 hour  Intake 1356.25 ml  Output 900 ml  Net 456.25 ml   Filed Weights   01/03/22 1200  Weight: 106.1 kg    Examination:  General exam: Appears calm and comfortable  Respiratory system: Clear to auscultation. Respiratory effort normal. No respiratory distress. No conversational  dyspnea.  Cardiovascular system: S1 & S2 heard, RRR. No murmurs. No pedal edema. Gastrointestinal system: Abdomen is nondistended, soft and nontender. Normal bowel sounds heard. Central nervous system: Alert and oriented. No focal neurological deficits. Speech clear.  Extremities: Symmetric in appearance  Skin: No rashes, lesions or ulcers on exposed skin  Psychiatry: Judgement and insight appear normal. Mood & affect appropriate.   Data Reviewed: I have personally reviewed following labs and imaging studies  CBC: Recent Labs  Lab 01/05/22 0604 01/05/22 1255 01/06/22 0436 01/07/22 0542 01/07/22 1529 01/08/22 0605 01/09/22 0427  WBC 20.5*  --  19.1* 15.8*  --  18.3* 15.6*  HGB 8.3* 8.9* 8.1* 7.7*  --  6.9* 7.9*  HCT 24.1*  --  23.0* 22.1*  --  20.2* 23.0*  MCV 71.5*  --  70.6* 71.8*  --  71.4* 72.1*  PLT 198  --  216 248 237 264 [id number]   Basic Metabolic Panel: Recent Labs  Lab 01/04/22 0550 01/05/22 0604 01/06/22 0436 01/07/22 0542 01/08/22 0605  NA 136 136 134* 134* 134*  K 4.4 4.1 4.0 4.0 3.7  CL 105 106 106 105 104  CO2 21* 22 21* 24 23  GLUCOSE 107* 109* 117* 114* 89  BUN 28* 17 15 13 15   CREATININE 1.52* 1.34* 1.37* 1.33* 1.51*  CALCIUM 8.2* 7.8* 7.7* 7.7* 7.6*  MG 2.2 1.9 1.9 2.0 1.9  PHOS  --   --   --   --  3.7   GFR: Estimated Creatinine Clearance: 56.4 mL/min (A) (by C-G formula based on SCr of 1.51 mg/dL (H)). Liver Function Tests: Recent Labs  Lab 01/07/22 0542  AST 23  ALT 29  ALKPHOS 156*  BILITOT 2.4*  PROT 6.3*  ALBUMIN 1.7*   No results for input(s): LIPASE, AMYLASE in the last 168 hours. No results for input(s): AMMONIA in the last 168 hours. Coagulation Profile: Recent Labs  Lab 01/04/22 0550 01/07/22 1529  INR 1.2 1.3*   Cardiac Enzymes: No results for input(s): CKTOTAL, CKMB, CKMBINDEX, TROPONINI in the last 168 hours. BNP (last 3 results) No results for input(s): PROBNP in the last 8760 hours. HbA1C: No results for input(s):  HGBA1C in the last 72 hours. CBG: Recent Labs  Lab 01/05/22 1256  GLUCAP 161*   Lipid Profile: No results for input(s): CHOL, HDL, LDLCALC, TRIG, CHOLHDL, LDLDIRECT in the last 72 hours. Thyroid Function Tests: No results for input(s): TSH, T4TOTAL, FREET4, T3FREE, THYROIDAB in the last 72 hours. Anemia Panel: No results for input(s): VITAMINB12, FOLATE, FERRITIN, TIBC, IRON, RETICCTPCT in the last 72 hours. Sepsis Labs: Recent Labs  Lab 01/03/22 1414 01/04/22 0550 01/05/22 1255 01/06/22 0436 01/07/22 0542  PROCALCITON 55.57  --   --   --   --   LATICACIDVEN  --  3.8* 2.7* 1.1 0.7    Recent Results (from the past 240 hour(s))  Resp Panel by RT-PCR (Flu A&B, Covid) Nasopharyngeal Swab     Status:  None   Collection Time: 01/03/22  9:25 AM   Specimen: Nasopharyngeal Swab; Nasopharyngeal(NP) swabs in vial transport medium  Result Value Ref Range Status   SARS Coronavirus 2 by RT PCR NEGATIVE NEGATIVE Final    Comment: (NOTE) SARS-CoV-2 target nucleic acids are NOT DETECTED.  The SARS-CoV-2 RNA is generally detectable in upper respiratory specimens during the acute phase of infection. The lowest concentration of SARS-CoV-2 viral copies this assay can detect is 138 copies/mL. A negative result does not preclude SARS-Cov-2 infection and should not be used as the sole basis for treatment or other patient management decisions. A negative result may occur with  improper specimen collection/handling, submission of specimen other than nasopharyngeal swab, presence of viral mutation(s) within the areas targeted by this assay, and inadequate number of viral copies(<138 copies/mL). A negative result must be combined with clinical observations, patient history, and epidemiological information. The expected result is Negative.  Fact Sheet for Patients:  EntrepreneurPulse.com.au  Fact Sheet for Healthcare Providers:  IncredibleEmployment.be  This  test is no t yet approved or cleared by the [place] FDA and  has been authorized for detection and/or diagnosis of SARS-CoV-2 by FDA under an Emergency Use Authorization (EUA). This EUA will remain  in effect (meaning this test can be used) for the duration of the COVID-19 declaration under Section 564(b)(1) of the Act, 21 U.S.C.section 360bbb-3(b)(1), unless the authorization is terminated  or revoked sooner.       Influenza A by PCR NEGATIVE NEGATIVE Final   Influenza B by PCR NEGATIVE NEGATIVE Final    Comment: (NOTE) The Xpert Xpress SARS-CoV-2/FLU/RSV plus assay is intended as an aid in the diagnosis of influenza from Nasopharyngeal swab specimens and should not be used as a sole basis for treatment. Nasal washings and aspirates are unacceptable for Xpert Xpress SARS-CoV-2/FLU/RSV testing.  Fact Sheet for Patients: EntrepreneurPulse.com.au  Fact Sheet for Healthcare Providers: IncredibleEmployment.be  This test is not yet approved or cleared by the [place] FDA and has been authorized for detection and/or diagnosis of SARS-CoV-2 by FDA under an Emergency Use Authorization (EUA). This EUA will remain in effect (meaning this test can be used) for the duration of the COVID-19 declaration under Section 564(b)(1) of the Act, 21 U.S.C. section 360bbb-3(b)(1), unless the authorization is terminated or revoked.  Performed at [facility], [address] [place], [place] [address]   Culture, blood (Routine X 2) w Reflex to ID Panel     Status: Abnormal (Preliminary result)   Collection Time: 01/05/22  6:05 AM   Specimen: BLOOD  Result Value Ref Range Status   Specimen Description   Final    BLOOD BLOOD RIGHT HAND Performed at [facility], [address], [place], [place] [address]    Special Requests   Final    BOTTLES DRAWN AEROBIC AND ANAEROBIC Blood Culture adequate volume Performed at [facility], [address] [place], [place] [address]    Culture  Setup Time   Final    Organism ID to follow GRAM NEGATIVE RODS GRAM POSITIVE COCCI IN PAIRS IN BOTH AEROBIC AND ANAEROBIC BOTTLES CRITICAL RESULT CALLED TO, READ BACK BY AND VERIFIED WITH: ABBY ELLINGTON ON 01/05/22 AT 1736 QSD CRITICAL RESULT CALLED TO, READ BACK BY AND VERIFIED WITH: PHARM D L.KLUTZ ON OO:[id number] AT 0948 BY E.PARRISH    Culture (A)  Final    ESCHERICHIA COLI GRAM POSITIVE COCCI CULTURE REINCUBATED FOR BETTER GROWTH Performed at [facility], [address] [place], [place] [address]    Report Status PENDING  Incomplete   Organism ID, Bacteria ESCHERICHIA COLI  Final      Susceptibility   Escherichia coli - MIC*    AMPICILLIN <=2 SENSITIVE Sensitive     CEFAZOLIN <=4 SENSITIVE Sensitive     CEFEPIME <=0.12 SENSITIVE Sensitive     CEFTAZIDIME <=1 SENSITIVE Sensitive     CEFTRIAXONE <=0.25 SENSITIVE Sensitive     CIPROFLOXACIN <=0.25 SENSITIVE Sensitive     GENTAMICIN <=1 SENSITIVE Sensitive     IMIPENEM <=0.25 SENSITIVE Sensitive     TRIMETH/SULFA <=20 SENSITIVE Sensitive     AMPICILLIN/SULBACTAM <=2 SENSITIVE Sensitive     PIP/TAZO <=4 SENSITIVE Sensitive     * ESCHERICHIA COLI  Blood Culture ID Panel (Reflexed)     Status: Abnormal   Collection Time: 01/05/22  6:05 AM  Result Value Ref Range Status   Enterococcus faecalis NOT DETECTED NOT DETECTED Final   Enterococcus Faecium NOT DETECTED NOT DETECTED Final   Listeria monocytogenes NOT DETECTED NOT DETECTED Final   Staphylococcus species NOT DETECTED NOT DETECTED Final   Staphylococcus aureus (BCID) NOT DETECTED NOT DETECTED Final   Staphylococcus epidermidis NOT DETECTED NOT DETECTED Final   Staphylococcus lugdunensis NOT DETECTED NOT DETECTED Final   Streptococcus species DETECTED (A) NOT DETECTED Final    Comment: Not Enterococcus species, Streptococcus agalactiae, Streptococcus pyogenes, or Streptococcus pneumoniae. CRITICAL RESULT  CALLED TO, READ BACK BY AND VERIFIED WITH: ABBY ELLINGTON ON 01/05/22 AT 1736 QSD    Streptococcus agalactiae NOT DETECTED NOT DETECTED Final   Streptococcus pneumoniae NOT DETECTED NOT DETECTED Final   Streptococcus pyogenes NOT DETECTED NOT DETECTED Final   A.calcoaceticus-baumannii NOT DETECTED NOT DETECTED Final   Bacteroides fragilis NOT DETECTED NOT DETECTED Final   Enterobacterales DETECTED (A) NOT DETECTED Final    Comment: Enterobacterales represent a large order of gram negative bacteria, not a single organism. CRITICAL RESULT CALLED TO, READ BACK BY AND VERIFIED WITH: ABBY ELLINGTON ON 01/05/22 AT 1736 QSD    Enterobacter cloacae complex NOT DETECTED NOT DETECTED Final   Escherichia coli DETECTED (A) NOT DETECTED Final    Comment: CRITICAL RESULT CALLED TO, READ BACK BY AND VERIFIED WITH: ABBY ELLINGTON ON 01/05/22 AT 1736 QSD    Klebsiella aerogenes NOT DETECTED NOT DETECTED Final   Klebsiella oxytoca NOT DETECTED NOT DETECTED Final   Klebsiella pneumoniae NOT DETECTED NOT DETECTED Final   Proteus species NOT DETECTED NOT DETECTED Final   Salmonella species NOT DETECTED NOT DETECTED Final   Serratia marcescens NOT DETECTED NOT DETECTED Final   Haemophilus influenzae NOT DETECTED NOT DETECTED Final   Neisseria meningitidis NOT DETECTED NOT DETECTED Final   Pseudomonas aeruginosa NOT DETECTED NOT DETECTED Final   Stenotrophomonas maltophilia NOT DETECTED NOT DETECTED Final   Candida albicans NOT DETECTED NOT DETECTED Final   Candida auris NOT DETECTED NOT DETECTED Final   Candida glabrata NOT DETECTED NOT DETECTED Final   Candida krusei NOT DETECTED NOT DETECTED Final   Candida parapsilosis NOT DETECTED NOT DETECTED Final   Candida tropicalis NOT DETECTED NOT DETECTED Final   Cryptococcus neoformans/gattii NOT DETECTED NOT DETECTED Final   CTX-M ESBL NOT DETECTED NOT DETECTED Final   Carbapenem resistance IMP NOT DETECTED NOT DETECTED Final   Carbapenem resistance KPC NOT  DETECTED NOT DETECTED Final   Carbapenem resistance NDM NOT DETECTED NOT DETECTED Final   Carbapenem resist OXA 48 LIKE NOT DETECTED NOT DETECTED Final   Carbapenem resistance VIM NOT  DETECTED NOT DETECTED Final    Comment: Performed at [facility], [place], [place] [address]  SARS CORONAVIRUS 2 (TAT 6-24 HRS) Nasopharyngeal Nasopharyngeal Swab     Status: None   Collection Time: 01/07/22  1:10 AM   Specimen: Nasopharyngeal Swab  Result Value Ref Range Status   SARS Coronavirus 2 NEGATIVE NEGATIVE Final    Comment: (NOTE) SARS-CoV-2 target nucleic acids are NOT DETECTED.  The SARS-CoV-2 RNA is generally detectable in upper and lower respiratory specimens during the acute phase of infection. Negative results do not preclude SARS-CoV-2 infection, do not rule out co-infections with other pathogens, and should not be used as the sole basis for treatment or other patient management decisions. Negative results must be combined with clinical observations, patient history, and epidemiological information. The expected result is Negative.  Fact Sheet for Patients: SugarRoll.be  Fact Sheet for Healthcare Providers: https://www.woods-mathews.com/  This test is not yet approved or cleared by the [place] FDA and  has been authorized for detection and/or diagnosis of SARS-CoV-2 by FDA under an Emergency Use Authorization (EUA). This EUA will remain  in effect (meaning this test can be used) for the duration of the COVID-19 declaration under Se ction 564(b)(1) of the Act, 21 U.S.C. section 360bbb-3(b)(1), unless the authorization is terminated or revoked sooner.  Performed at [facility], [place] [address], [place], [place] [address]       Radiology Studies: No results found.    Scheduled Meds:  amiodarone  400 mg Oral BID   enoxaparin (LOVENOX) injection  1 mg/kg Subcutaneous Q12H   polyethylene glycol   17 g Oral Daily   Continuous Infusions:  sodium chloride 10 mL/hr at 01/08/22 1009   ampicillin-sulbactam (UNASYN) IV 3 g (01/09/22 1042)     LOS: 6 days      Time spent: 25 minutes   Dessa Phi, DO [facility] 01/09/2022, 11:48 AM   Available via Epic secure chat 7am-7pm After these hours, please refer to coverage provider listed on [email or web address]

## 2022-01-09 NOTE — Progress Notes (Addendum)
Progress Note  Patient Name: Steven Pham Date of Encounter: 01/09/2022  Primary Cardiologist: Fletcher Anon  Subjective   No chest pain, dyspnea, palpitations, dizziness, presyncope, or syncope. Received 1 unit pRBC yesterday given HGB 6.9 (admission 11),  now 7.9. Denies hematochezia or melena with BM. Has been changed from heparin gtt to full dose Lovenox per vascular. Maintaining sinus rhythm. BP remains soft.   Inpatient Medications    Scheduled Meds:  amiodarone  400 mg Oral BID   enoxaparin (LOVENOX) injection  1 mg/kg Subcutaneous Q12H   polyethylene glycol  17 g Oral Daily   Continuous Infusions:  sodium chloride 10 mL/hr at 01/08/22 1009   ampicillin-sulbactam (UNASYN) IV 3 g (01/09/22 0507)   PRN Meds: sodium chloride, acetaminophen, magnesium hydroxide, ondansetron (ZOFRAN) IV, traZODone   Vital Signs    Vitals:   01/08/22 2011 01/08/22 2329 01/09/22 0236 01/09/22 0816  BP: 101/64 99/69 104/68 110/71  Pulse: 76 75 74 71  Resp: 16 16 18 20   Temp: 98.4 F (36.9 C) 98.5 F (36.9 C) 98 F (36.7 C) 99.1 F (37.3 C)  TempSrc: Oral   Oral  SpO2: 99% 97% 97% 96%  Weight:      Height:        Intake/Output Summary (Last 24 hours) at 01/09/2022 0901 Last data filed at 01/09/2022 0507 Gross per 24 hour  Intake 1368.66 ml  Output 475 ml  Net 893.66 ml    Filed Weights   01/03/22 1200  Weight: 106.1 kg    Telemetry    SR 70s bpm (reviewed in room, no data available on tele monitor at nurses station) - Personally Reviewed  ECG    No new tracings - Personally Reviewed  Physical Exam   GEN: No acute distress.   Neck: No JVD. Cardiac: RRR, no murmurs, rubs, or gallops.  Respiratory: Clear to auscultation bilaterally.  GI: Soft, mild tenderness along the epigastric region, non-distended.   MS: No edema; No deformity. Neuro:  Alert and oriented x 3; Nonfocal.  Psych: Normal affect.  Labs    Chemistry Recent Labs  Lab 01/06/22 0436 01/07/22 0542  01/08/22 0605  NA 134* 134* 134*  K 4.0 4.0 3.7  CL 106 105 104  CO2 21* 24 23  GLUCOSE 117* 114* 89  BUN 15 13 15   CREATININE 1.37* 1.33* 1.51*  CALCIUM 7.7* 7.7* 7.6*  PROT  --  6.3*  --   ALBUMIN  --  1.7*  --   AST  --  23  --   ALT  --  29  --   ALKPHOS  --  156*  --   BILITOT  --  2.4*  --   GFRNONAA 59* >60 53*  ANIONGAP 7 5 7       Hematology Recent Labs  Lab 01/07/22 0542 01/07/22 1529 01/08/22 0605 01/09/22 0427  WBC 15.8*  --  18.3* 15.6*  RBC 3.08*  --  2.83* 3.19*  HGB 7.7*  --  6.9* 7.9*  HCT 22.1*  --  20.2* 23.0*  MCV 71.8*  --  71.4* 72.1*  MCH 25.0*  --  24.4* 24.8*  MCHC 34.8  --  34.2 34.3  RDW 16.2*  --  16.1* 17.2*  PLT 248 237 264 329     Cardiac EnzymesNo results for input(s): TROPONINI in the last 168 hours. No results for input(s): TROPIPOC in the last 168 hours.   BNP Recent Labs  Lab 01/04/22 0550  BNP 450.2*  DDimer  Recent Labs  Lab 01/07/22 1529  DDIMER 3.13*      Radiology    CT ABDOMEN PELVIS W CONTRAST  Result Date: 01/05/2022 IMPRESSION: 1. Thrombus again identified in the portal vein with involvement of the superior mesenteric vein and left/right intrahepatic portal venous anatomy. Small foci of peripheral mesenteric venous gas noted in the left abdomen with interval increase in volume of portal venous gas since prior study of 12/26/2021. Portal venous gas can be seen in the setting of bowel ischemia although no evidence for large or small bowel pneumatosis at this time and no small bowel wall thickening. Gas can also be seen in the setting of superinfection. No interloop mesenteric fluid. No intraperitoneal free fluid. 2. Ill-defined gallbladder wall with apparent mild pericholecystic edema. No biliary duct dilatation. If there is clinical concern for acute cholecystitis, right upper quadrant ultrasound could be used to further evaluate. 3. Left renal cysts. 4. Aortic Atherosclerosis (ICD10-I70.0). Electronically  Signed   By: Misty Stanley M.D.   On: 01/05/2022 10:47   Cardiac Studies   2D echo 01/06/2022: 1. No valve vegetation noted.   2. Left ventricular ejection fraction, by estimation, is 30 to 35%. The  left ventricle has moderately decreased function. The left ventricle  demonstrates global hypokinesis. The left ventricular internal cavity size  was mildly to moderately dilated.  Left ventricular diastolic parameters are consistent with Grade II  diastolic dysfunction (pseudonormalization).   3. Right ventricular systolic function is normal. The right ventricular  size is normal. There is normal pulmonary artery systolic pressure. The  estimated right ventricular systolic pressure is [id number] mmHg.   4. Left atrial size was moderately dilated.   5. The mitral valve is normal in structure. No evidence of mitral valve  regurgitation. No evidence of mitral stenosis.   6. The aortic valve is normal in structure. Aortic valve regurgitation is  not visualized. No aortic stenosis is present.   7. The inferior vena cava is normal in size with greater than 50%  respiratory variability, suggesting right atrial pressure of 3 mmHg. ___________  2D echo 12/18/2021: 1. Left ventricular ejection fraction, by estimation, is 20 to 25%. The  left ventricle has severely decreased function. The left ventricle  demonstrates global hypokinesis. The left ventricular internal cavity size  was mildly dilated. There is mild left  ventricular hypertrophy. Left ventricular diastolic parameters are  consistent with Grade III diastolic dysfunction (restrictive).   2. Right ventricular systolic function is normal. The right ventricular  size is normal. There is mildly elevated pulmonary artery systolic  pressure.   3. Left atrial size was severely dilated.   4. Right atrial size was mildly dilated.   5. The mitral valve is normal in structure. Mild to moderate mitral valve  regurgitation. No evidence of mitral  stenosis.   6. The aortic valve is normal in structure. Aortic valve regurgitation is  not visualized. No aortic stenosis is present.   7. The inferior vena cava is normal in size with greater than 50%  respiratory variability, suggesting right atrial pressure of 3 mmHg.  Patient Profile     61 y.o. male with history of HFrEF, HTN, obesity, anemia, and OSA who is being seen today for the evaluation of Afib with RVR and HFrEF.  Assessment & Plan    1. Newly diagnosed Afib/flutter with RVR: -Maintaining sinus rhythm with oral amiodarone -Relative hypotension precludes beta blocker at this time -CHADS2VASc at least 3 -Has been changed from  IV heparin to Lovenox per vascular surgery, see recs regarding anemia below  2. HFrEF: -Appears euvolemic and well compensated  -Relative hypotension precludes escalation of GDMT at this time, start as able -R/LHC initially scheduled for 1/9, though in light of the patient developing bacteremia and progressive transfusion dependent anemia, this will need to be postponed  -Revisit R/LHC once his infection and anemia are treated  3. 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 Polymicrobial bacteremia: -Blood cultures growing Streptococcus and E coli  -GI in etiology  -Management per IM/ID  5. Anemia: -HGB trended from 11 to 6.9 s/p 1 unit pRBC yesterday with HGB 7.9 today -Defer continuation of full dose Lovenox to vascular surgery, given portal vein thrombus     For questions or updates, please contact [facility] Please consult [email or web address] for contact info under Cardiology/STEMI.    Signed, Christell Faith, PA-C [place] Pager: [phone] 01/09/2022, 9:01 AM

## 2022-01-10 ENCOUNTER — Inpatient Hospital Stay: Payer: Self-pay

## 2022-01-10 DIAGNOSIS — I81 Portal vein thrombosis: Secondary | ICD-10-CM | POA: Diagnosis not present

## 2022-01-10 DIAGNOSIS — K751 Phlebitis of portal vein: Secondary | ICD-10-CM | POA: Diagnosis not present

## 2022-01-10 DIAGNOSIS — R6510 Systemic inflammatory response syndrome (SIRS) of non-infectious origin without acute organ dysfunction: Secondary | ICD-10-CM

## 2022-01-10 DIAGNOSIS — I4891 Unspecified atrial fibrillation: Secondary | ICD-10-CM | POA: Diagnosis not present

## 2022-01-10 DIAGNOSIS — I10 Essential (primary) hypertension: Secondary | ICD-10-CM

## 2022-01-10 DIAGNOSIS — R7881 Bacteremia: Secondary | ICD-10-CM | POA: Diagnosis not present

## 2022-01-10 LAB — CULTURE, BLOOD (ROUTINE X 2): Special Requests: ADEQUATE

## 2022-01-10 LAB — BASIC METABOLIC PANEL
Anion gap: 7 (ref 5–15)
BUN: 8 mg/dL (ref 6–20)
CO2: 23 mmol/L (ref 22–32)
Calcium: 8 mg/dL — ABNORMAL LOW (ref 8.9–10.3)
Chloride: 104 mmol/L (ref 98–111)
Creatinine, Ser: 1.2 mg/dL (ref 0.61–1.24)
GFR, Estimated: >60 mL/min (ref >60)
Glucose, Bld: 91 mg/dL (ref 70–99)
Potassium: 4 mmol/L (ref 3.5–5.1)
Sodium: 134 mmol/L — ABNORMAL LOW (ref 135–145)

## 2022-01-10 LAB — CBC
HCT: 23.8 % — ABNORMAL LOW (ref 39.0–52.0)
Hemoglobin: 8 g/dL — ABNORMAL LOW (ref 13.0–17.0)
MCH: 24.6 pg — ABNORMAL LOW (ref 26.0–34.0)
MCHC: 33.6 g/dL (ref 30.0–36.0)
MCV: 73.2 fL — ABNORMAL LOW (ref 80.0–100.0)
Platelets: 349 10*3/uL (ref 150–400)
RBC: 3.25 MIL/uL — ABNORMAL LOW (ref 4.22–5.81)
RDW: 17.2 % — ABNORMAL HIGH (ref 11.5–15.5)
WBC: 11.5 10*3/uL — ABNORMAL HIGH (ref 4.0–10.5)
nRBC: 0 % (ref 0.0–0.2)

## 2022-01-10 MED ORDER — CHLORHEXIDINE GLUCONATE CLOTH 2 % EX PADS
6.0000 | MEDICATED_PAD | Freq: Every day | CUTANEOUS | Status: DC
  Administered 2022-01-10 – 2022-01-14 (×5): 6 via TOPICAL

## 2022-01-10 MED ORDER — SODIUM CHLORIDE 0.9% FLUSH
10.0000 mL | Freq: Two times a day (BID) | INTRAVENOUS | Status: DC
  Administered 2022-01-10 – 2022-01-14 (×9): 10 mL

## 2022-01-10 MED ORDER — SODIUM CHLORIDE 0.9% FLUSH: 10.0000 mL | INTRAVENOUS | Status: DC | PRN

## 2022-01-10 MED ORDER — APIXABAN 5 MG PO TABS
5.0000 mg | ORAL_TABLET | Freq: Two times a day (BID) | ORAL | Status: DC
  Administered 2022-01-10 – 2022-01-14 (×8): 5 mg via ORAL
  Filled 2022-01-10 (×8): qty 1

## 2022-01-10 MED ORDER — COLCHICINE 0.6 MG PO TABS
0.6000 mg | ORAL_TABLET | Freq: Two times a day (BID) | ORAL | Status: DC
  Administered 2022-01-10 – 2022-01-14 (×9): 0.6 mg via ORAL
  Filled 2022-01-10 (×9): qty 1

## 2022-01-10 MED ORDER — AMIODARONE HCL 200 MG PO TABS
200.0000 mg | ORAL_TABLET | Freq: Two times a day (BID) | ORAL | Status: DC
  Administered 2022-01-11 – 2022-01-14 (×7): 200 mg via ORAL
  Filled 2022-01-10 (×7): qty 1

## 2022-01-10 NOTE — Treatment Plan (Signed)
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

## 2022-01-10 NOTE — Progress Notes (Addendum)
PROGRESS NOTE    Steven Pham  [id number] DOB: January 16, 1961 DOA: 01/03/2022 PCP: [facility]     Brief Narrative:  Steven Pham is a 61 y.o. male with medical history significant for  gout, HTN, combined CHF recently diagnosed with EF 20 to 25% and grade 3 DD, who presented to the emergency room with chest pain and a feeling of her heart fluttering.  He was seen by Dr. Fletcher Anon who sent him to the emergency room for atrial fibrillation with rapid ventricular response.  Patient was initially treated with IV amiodarone drip, IV heparin drip.  Cardiology consulted.  Patient was also diagnosed with portal vein thrombosis, vascular surgery consulted.  Also with polymicrobial bacteremia, infectious disease consulted.  New events last 24 hours / Subjective: Patient complaining of gout flare in his left wrist and right ankle.  He has had gout flares before and this feels similar in same joints as previous.  States that colchicine worked in previous gout flares.  Assessment & Plan:   Principal Problem:   Atrial fibrillation with rapid ventricular response (HCC) Active Problems:   Acute combined systolic and diastolic congestive heart failure (HCC)   Portal vein thrombosis   HTN (hypertension)   Long term current use of anticoagulant therapy   HFrEF (heart failure with reduced ejection fraction) (HCC)   A. fib RVR -CHA2DS2-VASc score 3 -Initially treated with amiodarone drip, now on p.o. amiodarone -Remains on Lovenox -Cardiology following  Combined systolic, diastolic heart failure -Echocardiogram with EF 20 to [id number], grade 3 diastolic dysfunction -He will eventually need heart cath  Sepsis with strep and E. coli bacteremia -ID following, remains on Unasyn.  Recommended for 4-week course of ertapenem on discharge -Repeat blood cultures pending -PICC line ordered  Septic portal vein thrombosis -Vascular surgery following -Remains on Lovenox, he will need outpatient  vascular surgery follow-up  Anemia -No source of overt blood loss seen  -Status post 1 unit packed red blood cell -Continue to monitor closely -FOBT ordered   CKD stage IIIa -Baseline Cr 1.3 -Stable  Gout flare -Colchicine    DVT prophylaxis: Lovenox  Code Status: Full code Family Communication: No family at bedside Disposition Plan:  Status is: Inpatient  Remains inpatient appropriate because: Patient on IV antibiotics, continue to monitor trend of hemoglobin, plan for home health on discharge, PICC line ordered today   Antimicrobials:  Anti-infectives (From admission, onward)    Start     Dose/Rate Route Frequency Ordered Stop   01/08/22 1400  Ampicillin-Sulbactam (UNASYN) 3 g in sodium chloride 0.9 % 100 mL IVPB        3 g 200 mL/hr over 30 Minutes Intravenous Every 6 hours 01/08/22 1324 02/02/22 2359   01/06/22 1400  piperacillin-tazobactam (ZOSYN) IVPB 3.375 g  Status:  Discontinued        3.375 g 12.5 mL/hr over 240 Minutes Intravenous Every 8 hours 01/06/22 1303 01/08/22 1312   01/05/22 1900  cefTRIAXone (ROCEPHIN) 2 g in sodium chloride 0.9 % 100 mL IVPB  Status:  Discontinued        2 g 200 mL/hr over 30 Minutes Intravenous Every 24 hours 01/05/22 1810 01/06/22 1300        Objective: Vitals:   01/09/22 1141 01/09/22 1437 01/09/22 2115 01/10/22 0400  BP: 97/63 111/77 113/72 103/69  Pulse: 69 75 68 71  Resp: 16 19 18 18   Temp: 99.4 F (37.4 C) 98.3 F (36.8 C) 99.1 F (37.3 C) 97.9 F (36.6 C)  TempSrc: Oral  Oral Oral  SpO2: 97% 98% 98% 98%  Weight:      Height:        Intake/Output Summary (Last 24 hours) at 01/10/2022 1059 Last data filed at 01/10/2022 1000 Gross per 24 hour  Intake 1197.16 ml  Output 650 ml  Net 547.16 ml    Filed Weights   01/03/22 1200  Weight: 106.1 kg   Examination: General exam: Appears calm and comfortable  Respiratory system: Clear to auscultation. Respiratory effort normal. Cardiovascular system: S1 & S2  heard, RRR. No pedal edema. Gastrointestinal system: Abdomen is nondistended, soft and nontender. Normal bowel sounds heard. Central nervous system: Alert and oriented. Non focal exam. Speech clear  Extremities: Left wrist stiffness and edema, painful to palpation, right ankle swelling  Psychiatry: Judgement and insight appear stable. Mood & affect appropriate.    Data Reviewed: I have personally reviewed following labs and imaging studies  CBC: Recent Labs  Lab 01/06/22 0436 01/07/22 0542 01/07/22 1529 01/08/22 0605 01/09/22 0427 01/10/22 0606  WBC 19.1* 15.8*  --  18.3* 15.6* 11.5*  HGB 8.1* 7.7*  --  6.9* 7.9* 8.0*  HCT 23.0* 22.1*  --  20.2* 23.0* 23.8*  MCV 70.6* 71.8*  --  71.4* 72.1* 73.2*  PLT 216 248 237 264 329 0000000    Basic Metabolic Panel: Recent Labs  Lab 01/04/22 0550 01/05/22 0604 01/06/22 0436 01/07/22 0542 01/08/22 0605 01/10/22 0606  NA 136 136 134* 134* 134* 134*  K 4.4 4.1 4.0 4.0 3.7 4.0  CL 105 106 106 105 104 104  CO2 21* 22 21* 24 23 23   GLUCOSE 107* 109* 117* 114* 89 91  BUN 28* 17 15 13 15 8   CREATININE 1.52* 1.34* 1.37* 1.33* 1.51* 1.20  CALCIUM 8.2* 7.8* 7.7* 7.7* 7.6* 8.0*  MG 2.2 1.9 1.9 2.0 1.9  --   PHOS  --   --   --   --  3.7  --     GFR: Estimated Creatinine Clearance: 70.9 mL/min (by C-G formula based on SCr of 1.2 mg/dL). Liver Function Tests: Recent Labs  Lab 01/07/22 0542  AST 23  ALT 29  ALKPHOS 156*  BILITOT 2.4*  PROT 6.3*  ALBUMIN 1.7*    No results for input(s): LIPASE, AMYLASE in the last 168 hours. No results for input(s): AMMONIA in the last 168 hours. Coagulation Profile: Recent Labs  Lab 01/04/22 0550 01/07/22 1529  INR 1.2 1.3*    Cardiac Enzymes: No results for input(s): CKTOTAL, CKMB, CKMBINDEX, TROPONINI in the last 168 hours. BNP (last 3 results) No results for input(s): PROBNP in the last 8760 hours. HbA1C: No results for input(s): HGBA1C in the last 72 hours. CBG: Recent Labs  Lab  01/05/22 1256  GLUCAP 161*    Lipid Profile: No results for input(s): CHOL, HDL, LDLCALC, TRIG, CHOLHDL, LDLDIRECT in the last 72 hours. Thyroid Function Tests: No results for input(s): TSH, T4TOTAL, FREET4, T3FREE, THYROIDAB in the last 72 hours. Anemia Panel: No results for input(s): VITAMINB12, FOLATE, FERRITIN, TIBC, IRON, RETICCTPCT in the last 72 hours. Sepsis Labs: Recent Labs  Lab 01/03/22 1414 01/04/22 0550 01/05/22 1255 01/06/22 0436 01/07/22 0542  PROCALCITON 55.57  --   --   --   --   LATICACIDVEN  --  3.8* 2.7* 1.1 0.7     Recent Results (from the past 240 hour(s))  Resp Panel by RT-PCR (Flu A&B, Covid) Nasopharyngeal Swab     Status: None   Collection  Time: 01/03/22  9:25 AM   Specimen: Nasopharyngeal Swab; Nasopharyngeal(NP) swabs in vial transport medium  Result Value Ref Range Status   SARS Coronavirus 2 by RT PCR NEGATIVE NEGATIVE Final    Comment: (NOTE) SARS-CoV-2 target nucleic acids are NOT DETECTED.  The SARS-CoV-2 RNA is generally detectable in upper respiratory specimens during the acute phase of infection. The lowest concentration of SARS-CoV-2 viral copies this assay can detect is 138 copies/mL. A negative result does not preclude SARS-Cov-2 infection and should not be used as the sole basis for treatment or other patient management decisions. A negative result may occur with  improper specimen collection/handling, submission of specimen other than nasopharyngeal swab, presence of viral mutation(s) within the areas targeted by this assay, and inadequate number of viral copies(<138 copies/mL). A negative result must be combined with clinical observations, patient history, and epidemiological information. The expected result is Negative.  Fact Sheet for Patients:  EntrepreneurPulse.com.au  Fact Sheet for Healthcare Providers:  IncredibleEmployment.be  This test is no t yet approved or cleared by the [place] FDA and  has been authorized for detection and/or diagnosis of SARS-CoV-2 by FDA under an Emergency Use Authorization (EUA). This EUA will remain  in effect (meaning this test can be used) for the duration of the COVID-19 declaration under Section 564(b)(1) of the Act, 21 U.S.C.section 360bbb-3(b)(1), unless the authorization is terminated  or revoked sooner.       Influenza A by PCR NEGATIVE NEGATIVE Final   Influenza B by PCR NEGATIVE NEGATIVE Final    Comment: (NOTE) The Xpert Xpress SARS-CoV-2/FLU/RSV plus assay is intended as an aid in the diagnosis of influenza from Nasopharyngeal swab specimens and should not be used as a sole basis for treatment. Nasal washings and aspirates are unacceptable for Xpert Xpress SARS-CoV-2/FLU/RSV testing.  Fact Sheet for Patients: EntrepreneurPulse.com.au  Fact Sheet for Healthcare Providers: IncredibleEmployment.be  This test is not yet approved or cleared by the [place] FDA and has been authorized for detection and/or diagnosis of SARS-CoV-2 by FDA under an Emergency Use Authorization (EUA). This EUA will remain in effect (meaning this test can be used) for the duration of the COVID-19 declaration under Section 564(b)(1) of the Act, 21 U.S.C. section 360bbb-3(b)(1), unless the authorization is terminated or revoked.  Performed at [facility], [address] [place], [place] [address]   Culture, blood (Routine X 2) w Reflex to ID Panel     Status: Abnormal (Preliminary result)   Collection Time: 01/05/22  6:05 AM   Specimen: BLOOD  Result Value Ref Range Status   Specimen Description   Final    BLOOD BLOOD RIGHT HAND Performed at [facility] [facility], [address], [place], [place] [address]    Special Requests   Final    BOTTLES DRAWN AEROBIC AND ANAEROBIC Blood Culture adequate volume Performed at [facility], [address] [place],  [address]     Culture  Setup Time   Final    Organism ID to follow GRAM NEGATIVE RODS GRAM POSITIVE COCCI IN PAIRS IN BOTH AEROBIC AND ANAEROBIC BOTTLES CRITICAL RESULT CALLED TO, READ BACK BY AND VERIFIED WITH: ABBY ELLINGTON ON 01/05/22 AT 1736 QSD CRITICAL RESULT CALLED TO, READ BACK BY AND VERIFIED WITH: PHARM D L.KLUTZ ON OO:[id number] AT 0948 BY E.PARRISH    Culture (A)  Final    ESCHERICHIA COLI STREPTOCOCCUS ANGINOSIS THE SIGNIFICANCE OF ISOLATING THIS ORGANISM FROM A SINGLE SET OF BLOOD CULTURES WHEN MULTIPLE SETS ARE  DRAWN IS UNCERTAIN. PLEASE NOTIFY THE MICROBIOLOGY DEPARTMENT WITHIN ONE WEEK IF SPECIATION AND SENSITIVITIES ARE REQUIRED. Performed at [facility], [place] [address], [place], [place] [address]    Report Status PENDING  Incomplete   Organism ID, Bacteria ESCHERICHIA COLI  Final      Susceptibility   Escherichia coli - MIC*    AMPICILLIN <=2 SENSITIVE Sensitive     CEFAZOLIN <=4 SENSITIVE Sensitive     CEFEPIME <=0.12 SENSITIVE Sensitive     CEFTAZIDIME <=1 SENSITIVE Sensitive     CEFTRIAXONE <=0.25 SENSITIVE Sensitive     CIPROFLOXACIN <=0.25 SENSITIVE Sensitive     GENTAMICIN <=1 SENSITIVE Sensitive     IMIPENEM <=0.25 SENSITIVE Sensitive     TRIMETH/SULFA <=20 SENSITIVE Sensitive     AMPICILLIN/SULBACTAM <=2 SENSITIVE Sensitive     PIP/TAZO <=4 SENSITIVE Sensitive     * ESCHERICHIA COLI  Blood Culture ID Panel (Reflexed)     Status: Abnormal   Collection Time: 01/05/22  6:05 AM  Result Value Ref Range Status   Enterococcus faecalis NOT DETECTED NOT DETECTED Final   Enterococcus Faecium NOT DETECTED NOT DETECTED Final   Listeria monocytogenes NOT DETECTED NOT DETECTED Final   Staphylococcus species NOT DETECTED NOT DETECTED Final   Staphylococcus aureus (BCID) NOT DETECTED NOT DETECTED Final   Staphylococcus epidermidis NOT DETECTED NOT DETECTED Final   Staphylococcus lugdunensis NOT DETECTED NOT DETECTED Final   Streptococcus species DETECTED (A) NOT DETECTED  Final    Comment: Not Enterococcus species, Streptococcus agalactiae, Streptococcus pyogenes, or Streptococcus pneumoniae. CRITICAL RESULT CALLED TO, READ BACK BY AND VERIFIED WITH: ABBY ELLINGTON ON 01/05/22 AT 1736 QSD    Streptococcus agalactiae NOT DETECTED NOT DETECTED Final   Streptococcus pneumoniae NOT DETECTED NOT DETECTED Final   Streptococcus pyogenes NOT DETECTED NOT DETECTED Final   A.calcoaceticus-baumannii NOT DETECTED NOT DETECTED Final   Bacteroides fragilis NOT DETECTED NOT DETECTED Final   Enterobacterales DETECTED (A) NOT DETECTED Final    Comment: Enterobacterales represent a large order of gram negative bacteria, not a single organism. CRITICAL RESULT CALLED TO, READ BACK BY AND VERIFIED WITH: ABBY ELLINGTON ON 01/05/22 AT 1736 QSD    Enterobacter cloacae complex NOT DETECTED NOT DETECTED Final   Escherichia coli DETECTED (A) NOT DETECTED Final    Comment: CRITICAL RESULT CALLED TO, READ BACK BY AND VERIFIED WITH: ABBY ELLINGTON ON 01/05/22 AT 1736 QSD    Klebsiella aerogenes NOT DETECTED NOT DETECTED Final   Klebsiella oxytoca NOT DETECTED NOT DETECTED Final   Klebsiella pneumoniae NOT DETECTED NOT DETECTED Final   Proteus species NOT DETECTED NOT DETECTED Final   Salmonella species NOT DETECTED NOT DETECTED Final   Serratia marcescens NOT DETECTED NOT DETECTED Final   Haemophilus influenzae NOT DETECTED NOT DETECTED Final   Neisseria meningitidis NOT DETECTED NOT DETECTED Final   Pseudomonas aeruginosa NOT DETECTED NOT DETECTED Final   Stenotrophomonas maltophilia NOT DETECTED NOT DETECTED Final   Candida albicans NOT DETECTED NOT DETECTED Final   Candida auris NOT DETECTED NOT DETECTED Final   Candida glabrata NOT DETECTED NOT DETECTED Final   Candida krusei NOT DETECTED NOT DETECTED Final   Candida parapsilosis NOT DETECTED NOT DETECTED Final   Candida tropicalis NOT DETECTED NOT DETECTED Final   Cryptococcus neoformans/gattii NOT DETECTED NOT DETECTED Final    CTX-M ESBL NOT DETECTED NOT DETECTED Final   Carbapenem resistance IMP NOT DETECTED NOT DETECTED Final   Carbapenem resistance KPC NOT DETECTED NOT DETECTED Final   Carbapenem resistance  NDM NOT DETECTED NOT DETECTED Final   Carbapenem resist OXA 48 LIKE NOT DETECTED NOT DETECTED Final   Carbapenem resistance VIM NOT DETECTED NOT DETECTED Final    Comment: Performed at [facility], [address] [place], [place] [address]  SARS CORONAVIRUS 2 (TAT 6-24 HRS) Nasopharyngeal Nasopharyngeal Swab     Status: None   Collection Time: 01/07/22  1:10 AM   Specimen: Nasopharyngeal Swab  Result Value Ref Range Status   SARS Coronavirus 2 NEGATIVE NEGATIVE Final    Comment: (NOTE) SARS-CoV-2 target nucleic acids are NOT DETECTED.  The SARS-CoV-2 RNA is generally detectable in upper and lower respiratory specimens during the acute phase of infection. Negative results do not preclude SARS-CoV-2 infection, do not rule out co-infections with other pathogens, and should not be used as the sole basis for treatment or other patient management decisions. Negative results must be combined with clinical observations, patient history, and epidemiological information. The expected result is Negative.  Fact Sheet for Patients: HairSlick.no  Fact Sheet for Healthcare Providers: quierodirigir.com  This test is not yet approved or cleared by the [place] FDA and  has been authorized for detection and/or diagnosis of SARS-CoV-2 by FDA under an Emergency Use Authorization (EUA). This EUA will remain  in effect (meaning this test can be used) for the duration of the COVID-19 declaration under Se ction 564(b)(1) of the Act, 21 U.S.C. section 360bbb-3(b)(1), unless the authorization is terminated or revoked sooner.  Performed at [facility], [address], [place], [place] [address]   CULTURE, BLOOD (ROUTINE X 2) w Reflex to ID  Panel     Status: None (Preliminary result)   Collection Time: 01/09/22 11:56 AM   Specimen: BLOOD  Result Value Ref Range Status   Specimen Description BLOOD BLOOD RIGHT HAND  Final   Special Requests   Final    BOTTLES DRAWN AEROBIC AND ANAEROBIC Blood Culture results may not be optimal due to an excessive volume of blood received in culture bottles   Culture   Final    NO GROWTH < 24 HOURS Performed at [facility], [address], [place], [place] [address]    Report Status PENDING  Incomplete  CULTURE, BLOOD (ROUTINE X 2) w Reflex to ID Panel     Status: None (Preliminary result)   Collection Time: 01/09/22 12:03 PM   Specimen: BLOOD  Result Value Ref Range Status   Specimen Description BLOOD BLOOD LEFT HAND  Final   Special Requests   Final    BOTTLES DRAWN AEROBIC AND ANAEROBIC Blood Culture adequate volume   Culture   Final    NO GROWTH < 24 HOURS Performed at [facility], [address], [place], [place] [address]    Report Status PENDING  Incomplete       Radiology Studies: [place] EKG SITE RITE  Result Date: 01/10/2022 If Site Rite image not attached, placement could not be confirmed due to current cardiac rhythm.     Scheduled Meds:  amiodarone  400 mg Oral BID   colchicine  0.6 mg Oral BID   enoxaparin (LOVENOX) injection  1 mg/kg Subcutaneous Q12H   polyethylene glycol  17 g Oral Daily   Continuous Infusions:  sodium chloride 10 mL/hr at 01/08/22 1009   ampicillin-sulbactam (UNASYN) IV 3 g (01/10/22 0428)     LOS: 7 days      Time spent: 25 minutes   Noralee Stain, DO [facility] 01/10/2022, 10:59 AM  Available via Epic secure chat 7am-7pm After these hours, please refer to coverage provider listed on [email or web address]

## 2022-01-10 NOTE — Progress Notes (Signed)
ID Pt says he has pain rt great toe and left wrist Says he was told that he will go home tomorrow but wants the gout to be addressed first  O/e awake , alert, no distress Objective Patient Vitals for the past 24 hrs:  BP Temp Temp src Pulse Resp SpO2  01/10/22 0400 103/69 97.9 F (36.6 C) Oral 71 18 98 %  01/09/22 2115 113/72 99.1 F (37.3 C) Oral 68 18 98 %    Chest b/l air entry Hs irregular well controlled Abd soft CNS non focal Left wrist tender to touch and movt painful  Labs CBC Latest Ref Rng & Units 01/10/2022 01/09/2022 01/08/2022  WBC 4.0 - 10.5 K/uL 11.5(H) 15.6(H) 18.3(H)  Hemoglobin 13.0 - 17.0 g/dL 8.0(L) 7.9(L) 6.9(L)  Hematocrit 39.0 - 52.0 % 23.8(L) 23.0(L) 20.2(L)  Platelets 150 - 400 K/uL 349 329 264    CMP Latest Ref Rng & Units 01/10/2022 01/08/2022 01/07/2022  Glucose 70 - 99 mg/dL 91 89 114(H)  BUN 6 - 20 mg/dL 8 15 13   Creatinine 0.61 - 1.24 mg/dL 1.20 1.51(H) 1.33(H)  Sodium 135 - 145 mmol/L 134(L) 134(L) 134(L)  Potassium 3.5 - 5.1 mmol/L 4.0 3.7 4.0  Chloride 98 - 111 mmol/L 104 104 105  CO2 22 - 32 mmol/L 23 23 24   Calcium 8.9 - 10.3 mg/dL 8.0(L) 7.6(L) 7.7(L)  Total Protein 6.5 - 8.1 g/dL - - 6.3(L)  Total Bilirubin 0.3 - 1.2 mg/dL - - 2.4(H)  Alkaline Phos 38 - 126 U/L - - 156(H)  AST 15 - 41 U/L - - 23  ALT 0 - 44 U/L - - 29     Impression/recommendation Polymicrobial bacteremia source GI tract E.coli and streptococcus anginosus  in blood culture Jejunal diverticulitis with portal vein thrombosis and gas in the portal vein due to the above infection Septic  Pylephlebitis of portal vein, splenic, superior mesenteric vein Watch for mesenteric ischemia/SB ischemia Currently on  unasyn Will need IV antibiotics for total of  4 weeks  END date  -02/02/22  would do ertapenem  1 gram IV on discharge   Cardiomyopathy with EF 30-35%   Afib- new onset rate controlled- on heparin, amiodarone   ?Anemia - received PRBC   CKD- stable   GOUT- active  left wrist and rt great toe- started colchicine today   Discussed the management with patient and care team I will follow him as OP See treatment note ID will sign off- call if needed

## 2022-01-10 NOTE — Consult Note (Signed)
PHARMACY CONSULT NOTE FOR:  OUTPATIENT  PARENTERAL ANTIBIOTIC THERAPY (OPAT)  Indication: Polymicrobial bacteremia / jejunal diverticulitis with portal vein thrombosis and suppurative pylephlebitis Regimen: Ertapenem 1 g IV q24h End date: 02/02/22  IV antibiotic discharge orders are pended. To discharging provider:  please sign these orders via discharge navigator,  Select New Orders & click on the button choice - Manage This Unsigned Work.     Thank you for allowing pharmacy to be a part of this patient's care.  Benita Gutter 01/10/2022, 4:39 PM

## 2022-01-10 NOTE — Progress Notes (Signed)
Peripherally Inserted Central Catheter Placement  The IV Nurse has discussed with the patient and/or persons authorized to consent for the patient, the purpose of this procedure and the potential benefits and risks involved with this procedure.  The benefits include less needle sticks, lab draws from the catheter, and the patient may be discharged home with the catheter. Risks include, but not limited to, infection, bleeding, blood clot (thrombus formation), and puncture of an artery; nerve damage and irregular heartbeat and possibility to perform a PICC exchange if needed/ordered by physician.  Alternatives to this procedure were also discussed.  Bard Power PICC patient education guide, fact sheet on infection prevention and patient information card has been provided to patient /or left at bedside.    PICC Placement Documentation  PICC Single Lumen 01/10/22 Right Brachial 40 cm 0 cm (Active)  Indication for Insertion or Continuance of Line Home intravenous therapies (PICC only) 01/10/22 1130  Exposed Catheter (cm) 0 cm 01/10/22 1130  Site Assessment Clean;Dry;Intact 01/10/22 1130  Line Status Flushed;Saline locked;Blood return noted 01/10/22 1130  Dressing Type Transparent;Securing device 01/10/22 1130  Dressing Status Clean;Dry;Intact 01/10/22 1130  Antimicrobial disc in place? Yes 01/10/22 1130  Safety Lock Not Applicable 01/10/22 1130  Dressing Intervention New dressing 01/10/22 1130  Dressing Change Due 01/17/22 01/10/22 1130       Curt Jews 01/10/2022, 11:43 AM

## 2022-01-10 NOTE — Progress Notes (Addendum)
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 On broad-spectrum antibiotics  Inpatient Medications    Scheduled Meds:  [START ON 01/11/2022] amiodarone  200 mg Oral BID   amiodarone  400 mg Oral BID   apixaban  5 mg Oral BID   Chlorhexidine Gluconate Cloth  6 each Topical Daily   colchicine  0.6 mg Oral BID   polyethylene glycol  17 g Oral Daily   sodium chloride flush  10-40 mL Intracatheter Q12H   Continuous Infusions:  sodium chloride 10 mL/hr at 01/08/22 1009   ampicillin-sulbactam (UNASYN) IV 3 g (01/10/22 1201)   PRN Meds: sodium chloride, acetaminophen, magnesium hydroxide, ondansetron (ZOFRAN) IV, sodium chloride flush, traZODone   Vital Signs    Vitals:   01/09/22 1141 01/09/22 1437 01/09/22 2115 01/10/22 0400  BP: 97/63 111/77 113/72 103/69  Pulse: 69 75 68 71  Resp: 16 19 18 18   Temp: 99.4 F (37.4 C) 98.3 F (36.8 C) 99.1 F (37.3 C) 97.9 F (36.6 C)  TempSrc: Oral  Oral Oral  SpO2: 97% 98% 98% 98%  Weight:      Height:        Intake/Output Summary (Last 24 hours) at 01/10/2022 1618 Last data filed at 01/10/2022 1350 Gross per 24 hour  Intake 660 ml  Output 650 ml  Net 10 ml   Last 3 Weights 01/03/2022 01/03/2022 12/25/2021  Weight (lbs) 234 lb 243 lb 2 oz 245 lb  Weight (kg) 106.142 kg 110.281 kg 111.131 kg      Telemetry    Normal sinus rhythm- Personally Reviewed  ECG     - Personally Reviewed  Physical Exam    GEN: No acute distress.   Neck: No JVD Cardiac: RRR, no murmurs, rubs, or gallops.  Respiratory: Clear to auscultation bilaterally. GI: Soft, nontender, non-distended  MS: No edema; No deformity. Neuro:  Nonfocal  Psych: Normal affect   Labs    High Sensitivity Troponin:   Recent Labs  Lab 12/16/21 0658 12/25/21 2244 12/26/21 0808 01/03/22 1414 01/04/22 0550  TROPONINIHS 72* 15 13 51* 36*     Chemistry Recent Labs  Lab 01/06/22 0436 01/07/22 0542 01/08/22 0605 01/10/22 0606  NA 134* 134* 134* 134*  K 4.0 4.0 3.7 4.0  CL 106 105 104 104  CO2 21* 24 23 23   GLUCOSE 117* 114* 89 91  BUN 15 13 15 8   CREATININE 1.37* 1.33* 1.51* 1.20  CALCIUM 7.7* 7.7* 7.6* 8.0*  MG 1.9 2.0 1.9  --   PROT  --  6.3*  --   --   ALBUMIN  --  1.7*  --   --   AST  --  23  --   --   ALT  --  29  --   --   ALKPHOS  --  156*  --   --   BILITOT  --  2.4*  --   --   GFRNONAA 59* >60 53* >60  ANIONGAP 7 5 7  7  Lipids  Recent Labs  Lab 01/04/22 0550  CHOL 226*  TRIG 305*  HDL 14*  LDLCALC 151*  CHOLHDL 16.1    Hematology Recent Labs  Lab 01/08/22 0605 01/09/22 0427 01/10/22 0606  WBC 18.3* 15.6* 11.5*  RBC 2.83* 3.19* 3.25*  HGB 6.9* 7.9* 8.0*  HCT 20.2* 23.0* 23.8*  MCV 71.4* 72.1* 73.2*  MCH 24.4* 24.8* 24.6*  MCHC 34.2 34.3 33.6  RDW 16.1* 17.2* 17.2*  PLT 264 329 349   Thyroid No results for input(s): TSH, FREET4 in the last 168 hours.  BNP Recent Labs  Lab 01/04/22 0550  BNP 450.2*    DDimer  Recent Labs  Lab 01/07/22 1529  DDIMER 3.13*     Radiology    [place] EKG SITE RITE  Result Date: 01/10/2022 If Site Rite image not attached, placement could not be confirmed due to current cardiac rhythm.   Cardiac Studies     Patient Profile     61 y.o. male with history of HFrEF, HTN, obesity, anemia, and OSA who is being seen today for the evaluation of Afib with RVR and HFrEF.  Assessment & Plan    Atrial fibrillation/flutter with RVR Converting  shortly after admission on amiodarone infusion, has been transition to oral amiodarone Not on beta-blocker secondary to hypotension Will transition to oral amiodarone 200 twice daily tomorrow  Cardiomyopathy Echocardiogram confirming ejection fraction 30 to 35% global hypokinesis Not a good candidate for ischemic work-up given bacteremia/infection, anemia -Cardiac catheterization to be rescheduled at a later date , Medical therapy limited secondary to hypotension   Portal vein thrombosis On anticoagulation/Lovenox, managed by vascular surgery  Polymicrobial bacteremia Blood cultures growing strep, E. coli, on broad-spectrum antibiotics  Anemia Hemoglobin down to 6.9, transfused 1 unit Hemoglobin now stable 8  Very sick gentleman with multisystem organ failure  Total encounter time more than 35 minutes  Greater than 50% was spent in counseling and coordination of care with the patient   For questions or updates, please contact [facility] Please consult [email or web address] for contact info under        Signed, Ida Rogue, MD  01/10/2022, 4:18 PM

## 2022-01-11 ENCOUNTER — Encounter
Admission: EM | Disposition: A | Discharge status: Home Health | Payer: Self-pay | Source: Ambulatory Visit | Attending: Internal Medicine

## 2022-01-11 DIAGNOSIS — I959 Hypotension, unspecified: Secondary | ICD-10-CM

## 2022-01-11 DIAGNOSIS — I4891 Unspecified atrial fibrillation: Secondary | ICD-10-CM | POA: Diagnosis not present

## 2022-01-11 LAB — BRAIN NATRIURETIC PEPTIDE: B Natriuretic Peptide: 341.9 pg/mL — ABNORMAL HIGH (ref 0.0–100.0)

## 2022-01-11 LAB — BASIC METABOLIC PANEL
Anion gap: 8 (ref 5–15)
BUN: 9 mg/dL (ref 6–20)
CO2: 24 mmol/L (ref 22–32)
Calcium: 8.2 mg/dL — ABNORMAL LOW (ref 8.9–10.3)
Chloride: 103 mmol/L (ref 98–111)
Creatinine, Ser: 1.21 mg/dL (ref 0.61–1.24)
GFR, Estimated: >60 mL/min (ref >60)
Glucose, Bld: 99 mg/dL (ref 70–99)
Potassium: 4 mmol/L (ref 3.5–5.1)
Sodium: 135 mmol/L (ref 135–145)

## 2022-01-11 LAB — RETIC PANEL
Immature Retic Fract: 35.6 % — ABNORMAL HIGH (ref 2.3–15.9)
RBC.: 3.01 MIL/uL — ABNORMAL LOW (ref 4.22–5.81)
Retic Count, Absolute: 62.6 10*3/uL (ref 19.0–186.0)
Retic Ct Pct: 2.1 % (ref 0.4–3.1)
Reticulocyte Hemoglobin: 26.2 pg — ABNORMAL LOW (ref >27.9)

## 2022-01-11 LAB — CBC
HCT: 23.1 % — ABNORMAL LOW (ref 39.0–52.0)
Hemoglobin: 7.6 g/dL — ABNORMAL LOW (ref 13.0–17.0)
MCH: 24.6 pg — ABNORMAL LOW (ref 26.0–34.0)
MCHC: 32.9 g/dL (ref 30.0–36.0)
MCV: 74.8 fL — ABNORMAL LOW (ref 80.0–100.0)
Platelets: 376 10*3/uL (ref 150–400)
RBC: 3.09 MIL/uL — ABNORMAL LOW (ref 4.22–5.81)
RDW: 17.9 % — ABNORMAL HIGH (ref 11.5–15.5)
WBC: 11.7 10*3/uL — ABNORMAL HIGH (ref 4.0–10.5)
nRBC: 0 % (ref 0.0–0.2)

## 2022-01-11 SURGERY — RIGHT/LEFT HEART CATH AND CORONARY ANGIOGRAPHY
Anesthesia: Moderate Sedation

## 2022-01-11 MED ORDER — FERROUS SULFATE 325 (65 FE) MG PO TABS
325.0000 mg | ORAL_TABLET | Freq: Every day | ORAL | Status: DC
  Administered 2022-01-12 – 2022-01-14 (×3): 325 mg via ORAL
  Filled 2022-01-11 (×3): qty 1

## 2022-01-11 MED ORDER — SODIUM CHLORIDE 0.9 % IV SOLN
1.0000 g | INTRAVENOUS | Status: DC
  Administered 2022-01-11 – 2022-01-14 (×4): 1000 mg via INTRAVENOUS
  Filled 2022-01-11 (×5): qty 1

## 2022-01-11 NOTE — Progress Notes (Signed)
Progress Note  Patient Name: Steven Pham Date of Encounter: 01/11/2022  Primary Cardiologist: Kathlyn Sacramento, MD  Subjective   No chest pain or sob.  Has had intermittent abd discomfort - currently feels good.  Now on colchicine for L hand and bilat lower ext gout - has helped some.  Inpatient Medications    Scheduled Meds:  amiodarone  200 mg Oral BID   apixaban  5 mg Oral BID   Chlorhexidine Gluconate Cloth  6 each Topical Daily   colchicine  0.6 mg Oral BID   [START ON 01/12/2022] ferrous sulfate  325 mg Oral Q breakfast   polyethylene glycol  17 g Oral Daily   sodium chloride flush  10-40 mL Intracatheter Q12H   Continuous Infusions:  sodium chloride 10 mL/hr at 01/11/22 0415   ertapenem     PRN Meds: sodium chloride, acetaminophen, magnesium hydroxide, ondansetron (ZOFRAN) IV, sodium chloride flush, traZODone   Vital Signs    Vitals:   01/10/22 2317 01/11/22 0313 01/11/22 0700 01/11/22 1209  BP: 97/60 (!) 98/58 100/62 110/66  Pulse: 79 75  78  Resp: 20 20 20    Temp: 98.5 F (36.9 C) 98.9 F (37.2 C) 98.7 F (37.1 C) 98.5 F (36.9 C)  TempSrc:   Oral Oral  SpO2: 96% 98% 98% 98%  Weight:      Height:        Intake/Output Summary (Last 24 hours) at 01/11/2022 1218 Last data filed at 01/11/2022 1209 Gross per 24 hour  Intake 1200 ml  Output 400 ml  Net 800 ml   Filed Weights   01/03/22 1200  Weight: 106.1 kg    Physical Exam   GEN: Well nourished, well developed, in no acute distress.  HEENT: Grossly normal.  Neck: Supple, no JVD, carotid bruits, or masses. Cardiac: RRR, no murmurs, rubs, or gallops. No clubbing, cyanosis.  L hand/bilat feet are swollen w/o pitting edema.  Radials 2+, DP/PT 1+ and equal bilaterally.  Respiratory:  Respirations regular and unlabored, clear to auscultation bilaterally. GI: Soft, nontender, nondistended, BS + x 4. MS: no deformity or atrophy. Skin: warm and dry, no rash. Neuro:  Strength and sensation are  intact. Psych: AAOx3.  Normal affect.  Labs    Chemistry Recent Labs  Lab 01/07/22 0542 01/08/22 0605 01/10/22 0606 01/11/22 0543  NA 134* 134* 134* 135  K 4.0 3.7 4.0 4.0  CL 105 104 104 103  CO2 24 23 23 24   GLUCOSE 114* 89 91 99  BUN 13 15 8 9   CREATININE 1.33* 1.51* 1.20 1.21  CALCIUM 7.7* 7.6* 8.0* 8.2*  PROT 6.3*  --   --   --   ALBUMIN 1.7*  --   --   --   AST 23  --   --   --   ALT 29  --   --   --   ALKPHOS 156*  --   --   --   BILITOT 2.4*  --   --   --   GFRNONAA >60 53* >60 >60  ANIONGAP 5 7 7 8      Hematology Recent Labs  Lab 01/09/22 0427 01/10/22 0606 01/11/22 0543  WBC 15.6* 11.5* 11.7*  RBC 3.19* 3.25* 3.09*   3.01*  HGB 7.9* 8.0* 7.6*  HCT 23.0* 23.8* 23.1*  MCV 72.1* 73.2* 74.8*  MCH 24.8* 24.6* 24.6*  MCHC 34.3 33.6 32.9  RDW 17.2* 17.2* 17.9*  PLT 329 349 376    Cardiac Enzymes  Recent Labs  Lab 12/16/21 0658 12/25/21 2244 12/26/21 0808 01/03/22 1414 01/04/22 0550  TROPONINIHS 72* 15 13 51* 36*      BNP Recent Labs  Lab 01/11/22 0542  BNP 341.9*     DDimer  Recent Labs  Lab 01/07/22 1529  DDIMER 3.13*     Lipids  Lab Results  Component Value Date   CHOL 226 (H) 01/04/2022   HDL 14 (L) 01/04/2022   LDLCALC 151 (H) 01/04/2022   TRIG 305 (H) 01/04/2022   CHOLHDL 16.1 01/04/2022    HbA1c  Lab Results  Component Value Date   HGBA1C 6.6 (H) 01/05/2022    Radiology    ----------------------  Telemetry    RSR, 70's - Personally Reviewed  Cardiac Studies   2D Echocardiogram 1.8.2022  1. No valve vegetation noted.   2. Left ventricular ejection fraction, by estimation, is 30 to 35%. The  left ventricle has moderately decreased function. The left ventricle  demonstrates global hypokinesis. The left ventricular internal cavity size  was mildly to moderately dilated.  Left ventricular diastolic parameters are consistent with Grade II  diastolic dysfunction (pseudonormalization).   3. Right ventricular  systolic function is normal. The right ventricular  size is normal. There is normal pulmonary artery systolic pressure. The  estimated right ventricular systolic pressure is [id number] mmHg.   4. Left atrial size was moderately dilated.   5. The mitral valve is normal in structure. No evidence of mitral valve  regurgitation. No evidence of mitral stenosis.   6. The aortic valve is normal in structure. Aortic valve regurgitation is  not visualized. No aortic stenosis is present.   7. The inferior vena cava is normal in size with greater than 50%  respiratory variability, suggesting right atrial pressure of 3 mmHg.   Patient Profile     61 y.o. male w/ a h/o HTN, obesity, anemia, OSA, and recently dx cardiomyopathy (EF 30-35%), who was admitted from the office w/ AFib RVR (now in sinus on amio), and incidentally found to have portal vein thrombosis and polymicrobial bacteremia.  Assessment & Plan    1.  Afib w/ RVR:  Converted on IV amio.  Oral amio reduced to 200 BID beginning this AM.  Maintaining sinus rhythm.  Cont amio 200mg  bid + eliquis.  Will likely reduce amio dose to 200mg  daily in 2 wks.  Will need outpt monitoring of LFTs/TFTs/PFTs.  With relative youth, would like to limit long term exposure to amio.  2.  Cardiomyopathy:  ? ICM vs NICM.  EF 30-35% by echo w/ glob HK.  Euvolemic on exam.  He is net + for admission, though UO only recorded over the past 3 days.  He has not require diuresis up to this point.  Will eventually need ischemic eval but remains a poor candidate at this time in the setting of anemia, OAC 2/2 portal vein thrombosis, and bacteremia.  Unfortunately, due to relative hypotension, he is not currently on any GDMT, though he may tolerate very low dose metoprolol succinate.  3.  Portal vein thrombosis:  noted on CT this admission.  On eliquis.  Seen by vasc surgery.  4.  Polymicrobial bacteremia:  BC w/ strep and E coli.  Abx per ID.  5.  Microcytic anemia:  H/H remains  low - hovering around 7-8.  On eliquis in the setting of #3.  6.  AKI:  renal fxn now stable.  7.  Gout:  colchicine for L hand, bilat foot pain.  Has helped some up to this point.  Signed, Murray Hodgkins, NP  01/11/2022, 12:18 PM    For questions or updates, please contact   Please consult [email or web address] for contact info under Cardiology/STEMI.

## 2022-01-11 NOTE — Progress Notes (Signed)
PROGRESS NOTE    Steven Pham  [id number] DOB: 1961-01-11 DOA: 01/03/2022 PCP: [facility] [place]     Brief Narrative:  Steven Pham is a 61 y.o. male with medical history significant for  gout, HTN, combined CHF recently diagnosed with EF 20 to 25% and grade 3 DD, who presented to the emergency room with chest pain and a feeling of her heart fluttering.  He was seen by Dr. Fletcher Anon who sent him to the emergency room for atrial fibrillation with rapid ventricular response.  Patient was initially treated with IV amiodarone drip, IV heparin drip.  Cardiology consulted.  Patient was also diagnosed with portal vein thrombosis, vascular surgery consulted.  Also with polymicrobial bacteremia, infectious disease consulted.  New events last 24 hours / Subjective: Complaining of bilateral ankle edema.  Still having pain in his left wrist from gout flare.  Assessment & Plan:   Principal Problem:   Atrial fibrillation with rapid ventricular response (HCC) Active Problems:   Acute combined systolic and diastolic congestive heart failure (HCC)   Portal vein thrombosis   HTN (hypertension)   Long term current use of anticoagulant therapy   HFrEF (heart failure with reduced ejection fraction) (HCC)   A. fib RVR -CHA2DS2-VASc score 3 -Initially treated with amiodarone drip, now on p.o. amiodarone -Continue Eliquis -Cardiology following  Combined systolic, diastolic heart failure -Echocardiogram with EF 20 to 123456, grade 3 diastolic dysfunction -He will eventually need heart cath -Diuretic is limited by his marginal blood pressure -BNP 341.9  Sepsis with strep and E. coli bacteremia -ID following, recommended for 4-week course of ertapenem on discharge -Repeat blood cultures negative to date  -PICC placed 1/12  Septic portal vein thrombosis -Vascular surgery following -Continue Eliquis  Anemia -No source of overt blood loss seen  -Status post 1 unit packed red blood  cell -Continue to monitor closely -FOBT pending -Iron tablets   CKD stage IIIa -Baseline Cr 1.3 -Stable  Gout flare -Colchicine    DVT prophylaxis:  apixaban (ELIQUIS) tablet 5 mg  Code Status: Full code Family Communication: No family at bedside Disposition Plan:  Status is: Inpatient  Remains inpatient appropriate because: Patient on IV antibiotics, continue to monitor trend of hemoglobin, plan for home health on discharge, edema with limited diuretic tx due to hypotension    Antimicrobials:  Anti-infectives (From admission, onward)    Start     Dose/Rate Route Frequency Ordered Stop   01/11/22 1200  ertapenem (INVANZ) 1,000 mg in sodium chloride 0.9 % 100 mL IVPB        1 g 200 mL/hr over 30 Minutes Intravenous Every 24 hours 01/11/22 0837     01/08/22 1400  Ampicillin-Sulbactam (UNASYN) 3 g in sodium chloride 0.9 % 100 mL IVPB  Status:  Discontinued        3 g 200 mL/hr over 30 Minutes Intravenous Every 6 hours 01/08/22 1324 01/11/22 0837   01/06/22 1400  piperacillin-tazobactam (ZOSYN) IVPB 3.375 g  Status:  Discontinued        3.375 g 12.5 mL/hr over 240 Minutes Intravenous Every 8 hours 01/06/22 1303 01/08/22 1312   01/05/22 1900  cefTRIAXone (ROCEPHIN) 2 g in sodium chloride 0.9 % 100 mL IVPB  Status:  Discontinued        2 g 200 mL/hr over 30 Minutes Intravenous Every 24 hours 01/05/22 1810 01/06/22 1300        Objective: Vitals:   01/10/22 1949 01/10/22 2317 01/11/22 0313 01/11/22 0700  BP: 106/68  97/60 (!) 98/58 100/62  Pulse: 82 79 75   Resp: 18 20 20 20   Temp: 97.9 F (36.6 C) 98.5 F (36.9 C) 98.9 F (37.2 C) 98.7 F (37.1 C)  TempSrc:    Oral  SpO2: 97% 96% 98% 98%  Weight:      Height:        Intake/Output Summary (Last 24 hours) at 01/11/2022 1159 Last data filed at 01/11/2022 1000 Gross per 24 hour  Intake 960 ml  Output 400 ml  Net 560 ml    Filed Weights   01/03/22 1200  Weight: 106.1 kg   Examination: General exam: Appears  calm and comfortable  Respiratory system: Clear to auscultation. Respiratory effort normal. Cardiovascular system: S1 & S2 heard, RRR. Bilateral pedal edema. Gastrointestinal system: Abdomen is nondistended, soft and nontender. Normal bowel sounds heard. Central nervous system: Alert and oriented. Non focal exam. Speech clear  Extremities: Left wrist stiffness and edema, tender to palpation, Bilateral pedal edema Psychiatry: Judgement and insight appear stable. Mood & affect appropriate.    Data Reviewed: I have personally reviewed following labs and imaging studies  CBC: Recent Labs  Lab 01/07/22 0542 01/07/22 1529 01/08/22 0605 01/09/22 0427 01/10/22 0606 01/11/22 0543  WBC 15.8*  --  18.3* 15.6* 11.5* 11.7*  HGB 7.7*  --  6.9* 7.9* 8.0* 7.6*  HCT 22.1*  --  20.2* 23.0* 23.8* 23.1*  MCV 71.8*  --  71.4* 72.1* 73.2* 74.8*  PLT 248 237 264 329 349 [id number]    Basic Metabolic Panel: Recent Labs  Lab 01/05/22 0604 01/06/22 0436 01/07/22 0542 01/08/22 0605 01/10/22 0606 01/11/22 0543  NA 136 134* 134* 134* 134* 135  K 4.1 4.0 4.0 3.7 4.0 4.0  CL 106 106 105 104 104 103  CO2 22 21* 24 23 23 24   GLUCOSE 109* 117* 114* 89 91 99  BUN 17 15 13 15 8 9   CREATININE 1.34* 1.37* 1.33* 1.51* 1.20 1.21  CALCIUM 7.8* 7.7* 7.7* 7.6* 8.0* 8.2*  MG 1.9 1.9 2.0 1.9  --   --   PHOS  --   --   --  3.7  --   --     GFR: Estimated Creatinine Clearance: 70.3 mL/min (by C-G formula based on SCr of 1.21 mg/dL). Liver Function Tests: Recent Labs  Lab 01/07/22 0542  AST 23  ALT 29  ALKPHOS 156*  BILITOT 2.4*  PROT 6.3*  ALBUMIN 1.7*    No results for input(s): LIPASE, AMYLASE in the last 168 hours. No results for input(s): AMMONIA in the last 168 hours. Coagulation Profile: Recent Labs  Lab 01/07/22 1529  INR 1.3*    Cardiac Enzymes: No results for input(s): CKTOTAL, CKMB, CKMBINDEX, TROPONINI in the last 168 hours. BNP (last 3 results) No results for input(s): PROBNP in the  last 8760 hours. HbA1C: No results for input(s): HGBA1C in the last 72 hours. CBG: Recent Labs  Lab 01/05/22 1256  GLUCAP 161*    Lipid Profile: No results for input(s): CHOL, HDL, LDLCALC, TRIG, CHOLHDL, LDLDIRECT in the last 72 hours. Thyroid Function Tests: No results for input(s): TSH, T4TOTAL, FREET4, T3FREE, THYROIDAB in the last 72 hours. Anemia Panel: Recent Labs    01/11/22 0543  RETICCTPCT 2.1   Sepsis Labs: Recent Labs  Lab 01/05/22 1255 01/06/22 0436 01/07/22 0542  LATICACIDVEN 2.7* 1.1 0.7     Recent Results (from the past 240 hour(s))  Resp Panel by RT-PCR (Flu A&B, Covid) Nasopharyngeal Swab  Status: None   Collection Time: 01/03/22  9:25 AM   Specimen: Nasopharyngeal Swab; Nasopharyngeal(NP) swabs in vial transport medium  Result Value Ref Range Status   SARS Coronavirus 2 by RT PCR NEGATIVE NEGATIVE Final    Comment: (NOTE) SARS-CoV-2 target nucleic acids are NOT DETECTED.  The SARS-CoV-2 RNA is generally detectable in upper respiratory specimens during the acute phase of infection. The lowest concentration of SARS-CoV-2 viral copies this assay can detect is 138 copies/mL. A negative result does not preclude SARS-Cov-2 infection and should not be used as the sole basis for treatment or other patient management decisions. A negative result may occur with  improper specimen collection/handling, submission of specimen other than nasopharyngeal swab, presence of viral mutation(s) within the areas targeted by this assay, and inadequate number of viral copies(<138 copies/mL). A negative result must be combined with clinical observations, patient history, and epidemiological information. The expected result is Negative.  Fact Sheet for Patients:  EntrepreneurPulse.com.au  Fact Sheet for Healthcare Providers:  IncredibleEmployment.be  This test is no t yet approved or cleared by the [place] FDA and  has  been authorized for detection and/or diagnosis of SARS-CoV-2 by FDA under an Emergency Use Authorization (EUA). This EUA will remain  in effect (meaning this test can be used) for the duration of the COVID-19 declaration under Section 564(b)(1) of the Act, 21 U.S.C.section 360bbb-3(b)(1), unless the authorization is terminated  or revoked sooner.       Influenza A by PCR NEGATIVE NEGATIVE Final   Influenza B by PCR NEGATIVE NEGATIVE Final    Comment: (NOTE) The Xpert Xpress SARS-CoV-2/FLU/RSV plus assay is intended as an aid in the diagnosis of influenza from Nasopharyngeal swab specimens and should not be used as a sole basis for treatment. Nasal washings and aspirates are unacceptable for Xpert Xpress SARS-CoV-2/FLU/RSV testing.  Fact Sheet for Patients: EntrepreneurPulse.com.au  Fact Sheet for Healthcare Providers: IncredibleEmployment.be  This test is not yet approved or cleared by the [place] FDA and has been authorized for detection and/or diagnosis of SARS-CoV-2 by FDA under an Emergency Use Authorization (EUA). This EUA will remain in effect (meaning this test can be used) for the duration of the COVID-19 declaration under Section 564(b)(1) of the Act, 21 U.S.C. section 360bbb-3(b)(1), unless the authorization is terminated or revoked.  Performed at [facility], [address] [place], [place] [address]   Culture, blood (Routine X 2) w Reflex to ID Panel     Status: Abnormal   Collection Time: 01/05/22  6:05 AM   Specimen: BLOOD  Result Value Ref Range Status   Specimen Description   Final    BLOOD BLOOD RIGHT HAND Performed at [facility], [address] [place], [place] [address]    Special Requests   Final    BOTTLES DRAWN AEROBIC AND ANAEROBIC Blood Culture adequate volume Performed at [facility], [address] [place], [place] [address]    Culture  Setup Time   Final    Organism  ID to follow GRAM NEGATIVE RODS GRAM POSITIVE COCCI IN PAIRS IN BOTH AEROBIC AND ANAEROBIC BOTTLES CRITICAL RESULT CALLED TO, READ BACK BY AND VERIFIED WITH: ABBY ELLINGTON ON 01/05/22 AT 1736 QSD CRITICAL RESULT CALLED TO, READ BACK BY AND VERIFIED WITH: PHARM D L.KLUTZ ON OO:[id number] AT 0948 BY E.PARRISH    Culture (A)  Final    ESCHERICHIA COLI STREPTOCOCCUS ANGINOSIS THE SIGNIFICANCE OF ISOLATING THIS ORGANISM FROM A SINGLE SET OF BLOOD CULTURES WHEN  MULTIPLE SETS ARE DRAWN IS UNCERTAIN. PLEASE NOTIFY THE MICROBIOLOGY DEPARTMENT WITHIN ONE WEEK IF SPECIATION AND SENSITIVITIES ARE REQUIRED. Performed at [facility], [place] [address] [place], [place] [address]    Report Status 01/10/2022 FINAL  Final   Organism ID, Bacteria ESCHERICHIA COLI  Final      Susceptibility   Escherichia coli - MIC*    AMPICILLIN <=2 SENSITIVE Sensitive     CEFAZOLIN <=4 SENSITIVE Sensitive     CEFEPIME <=0.12 SENSITIVE Sensitive     CEFTAZIDIME <=1 SENSITIVE Sensitive     CEFTRIAXONE <=0.25 SENSITIVE Sensitive     CIPROFLOXACIN <=0.25 SENSITIVE Sensitive     GENTAMICIN <=1 SENSITIVE Sensitive     IMIPENEM <=0.25 SENSITIVE Sensitive     TRIMETH/SULFA <=20 SENSITIVE Sensitive     AMPICILLIN/SULBACTAM <=2 SENSITIVE Sensitive     PIP/TAZO <=4 SENSITIVE Sensitive     * ESCHERICHIA COLI  Blood Culture ID Panel (Reflexed)     Status: Abnormal   Collection Time: 01/05/22  6:05 AM  Result Value Ref Range Status   Enterococcus faecalis NOT DETECTED NOT DETECTED Final   Enterococcus Faecium NOT DETECTED NOT DETECTED Final   Listeria monocytogenes NOT DETECTED NOT DETECTED Final   Staphylococcus species NOT DETECTED NOT DETECTED Final   Staphylococcus aureus (BCID) NOT DETECTED NOT DETECTED Final   Staphylococcus epidermidis NOT DETECTED NOT DETECTED Final   Staphylococcus lugdunensis NOT DETECTED NOT DETECTED Final   Streptococcus species DETECTED (A) NOT DETECTED Final    Comment: Not Enterococcus species,  Streptococcus agalactiae, Streptococcus pyogenes, or Streptococcus pneumoniae. CRITICAL RESULT CALLED TO, READ BACK BY AND VERIFIED WITH: ABBY ELLINGTON ON 01/05/22 AT 1736 QSD    Streptococcus agalactiae NOT DETECTED NOT DETECTED Final   Streptococcus pneumoniae NOT DETECTED NOT DETECTED Final   Streptococcus pyogenes NOT DETECTED NOT DETECTED Final   A.calcoaceticus-baumannii NOT DETECTED NOT DETECTED Final   Bacteroides fragilis NOT DETECTED NOT DETECTED Final   Enterobacterales DETECTED (A) NOT DETECTED Final    Comment: Enterobacterales represent a large order of gram negative bacteria, not a single organism. CRITICAL RESULT CALLED TO, READ BACK BY AND VERIFIED WITH: ABBY ELLINGTON ON 01/05/22 AT 1736 QSD    Enterobacter cloacae complex NOT DETECTED NOT DETECTED Final   Escherichia coli DETECTED (A) NOT DETECTED Final    Comment: CRITICAL RESULT CALLED TO, READ BACK BY AND VERIFIED WITH: ABBY ELLINGTON ON 01/05/22 AT 1736 QSD    Klebsiella aerogenes NOT DETECTED NOT DETECTED Final   Klebsiella oxytoca NOT DETECTED NOT DETECTED Final   Klebsiella pneumoniae NOT DETECTED NOT DETECTED Final   Proteus species NOT DETECTED NOT DETECTED Final   Salmonella species NOT DETECTED NOT DETECTED Final   Serratia marcescens NOT DETECTED NOT DETECTED Final   Haemophilus influenzae NOT DETECTED NOT DETECTED Final   Neisseria meningitidis NOT DETECTED NOT DETECTED Final   Pseudomonas aeruginosa NOT DETECTED NOT DETECTED Final   Stenotrophomonas maltophilia NOT DETECTED NOT DETECTED Final   Candida albicans NOT DETECTED NOT DETECTED Final   Candida auris NOT DETECTED NOT DETECTED Final   Candida glabrata NOT DETECTED NOT DETECTED Final   Candida krusei NOT DETECTED NOT DETECTED Final   Candida parapsilosis NOT DETECTED NOT DETECTED Final   Candida tropicalis NOT DETECTED NOT DETECTED Final   Cryptococcus neoformans/gattii NOT DETECTED NOT DETECTED Final   CTX-M ESBL NOT DETECTED NOT DETECTED Final    Carbapenem resistance IMP NOT DETECTED NOT DETECTED Final   Carbapenem resistance KPC NOT DETECTED NOT DETECTED Final  Carbapenem resistance NDM NOT DETECTED NOT DETECTED Final   Carbapenem resist OXA 48 LIKE NOT DETECTED NOT DETECTED Final   Carbapenem resistance VIM NOT DETECTED NOT DETECTED Final    Comment: Performed at [facility], [place], [place] [address]  SARS CORONAVIRUS 2 (TAT 6-24 HRS) Nasopharyngeal Nasopharyngeal Swab     Status: None   Collection Time: 01/07/22  1:10 AM   Specimen: Nasopharyngeal Swab  Result Value Ref Range Status   SARS Coronavirus 2 NEGATIVE NEGATIVE Final    Comment: (NOTE) SARS-CoV-2 target nucleic acids are NOT DETECTED.  The SARS-CoV-2 RNA is generally detectable in upper and lower respiratory specimens during the acute phase of infection. Negative results do not preclude SARS-CoV-2 infection, do not rule out co-infections with other pathogens, and should not be used as the sole basis for treatment or other patient management decisions. Negative results must be combined with clinical observations, patient history, and epidemiological information. The expected result is Negative.  Fact Sheet for Patients: SugarRoll.be  Fact Sheet for Healthcare Providers: https://www.woods-mathews.com/  This test is not yet approved or cleared by the [place] FDA and  has been authorized for detection and/or diagnosis of SARS-CoV-2 by FDA under an Emergency Use Authorization (EUA). This EUA will remain  in effect (meaning this test can be used) for the duration of the COVID-19 declaration under Se ction 564(b)(1) of the Act, 21 U.S.C. section 360bbb-3(b)(1), unless the authorization is terminated or revoked sooner.  Performed at [facility], [place] [address] [place], [place] [address]   CULTURE, BLOOD (ROUTINE X 2) w Reflex to ID Panel     Status: None (Preliminary result)    Collection Time: 01/09/22 11:56 AM   Specimen: BLOOD  Result Value Ref Range Status   Specimen Description BLOOD BLOOD RIGHT HAND  Final   Special Requests   Final    BOTTLES DRAWN AEROBIC AND ANAEROBIC Blood Culture results may not be optimal due to an excessive volume of blood received in culture bottles   Culture   Final    NO GROWTH 2 DAYS Performed at [facility], [address] [place], [place] [address]    Report Status PENDING  Incomplete  CULTURE, BLOOD (ROUTINE X 2) w Reflex to ID Panel     Status: None (Preliminary result)   Collection Time: 01/09/22 12:03 PM   Specimen: BLOOD  Result Value Ref Range Status   Specimen Description BLOOD BLOOD LEFT HAND  Final   Special Requests   Final    BOTTLES DRAWN AEROBIC AND ANAEROBIC Blood Culture adequate volume   Culture   Final    NO GROWTH 2 DAYS Performed at [facility], [address] [place], [place] [address]    Report Status PENDING  Incomplete       Radiology Studies: [place] EKG SITE RITE  Result Date: 01/10/2022 If Site Rite image not attached, placement could not be confirmed due to current cardiac rhythm.     Scheduled Meds:  amiodarone  200 mg Oral BID   apixaban  5 mg Oral BID   Chlorhexidine Gluconate Cloth  6 each Topical Daily   colchicine  0.6 mg Oral BID   polyethylene glycol  17 g Oral Daily   sodium chloride flush  10-40 mL Intracatheter Q12H   Continuous Infusions:  sodium chloride 10 mL/hr at 01/11/22 0415   ertapenem       LOS: 8 days      Time  spent: 25 minutes   Dessa Phi, DO [facility] 01/11/2022, 11:59 AM   Available via Epic secure chat 7am-7pm After these hours, please refer to coverage provider listed on [email or web address]

## 2022-01-12 DIAGNOSIS — I4891 Unspecified atrial fibrillation: Secondary | ICD-10-CM | POA: Diagnosis not present

## 2022-01-12 LAB — IRON AND TIBC
Iron: 24 ug/dL — ABNORMAL LOW (ref 45–182)
Saturation Ratios: 11 % — ABNORMAL LOW (ref 17.9–39.5)
TIBC: 214 ug/dL — ABNORMAL LOW (ref 250–450)
UIBC: 190 ug/dL

## 2022-01-12 LAB — BASIC METABOLIC PANEL
Anion gap: 5 (ref 5–15)
BUN: 9 mg/dL (ref 6–20)
CO2: 23 mmol/L (ref 22–32)
Calcium: 8.2 mg/dL — ABNORMAL LOW (ref 8.9–10.3)
Chloride: 105 mmol/L (ref 98–111)
Creatinine, Ser: 1.1 mg/dL (ref 0.61–1.24)
GFR, Estimated: >60 mL/min (ref >60)
Glucose, Bld: 122 mg/dL — ABNORMAL HIGH (ref 70–99)
Potassium: 3.6 mmol/L (ref 3.5–5.1)
Sodium: 133 mmol/L — ABNORMAL LOW (ref 135–145)

## 2022-01-12 LAB — FERRITIN: Ferritin: 514 ng/mL — ABNORMAL HIGH (ref 24–336)

## 2022-01-12 LAB — CBC
HCT: 21.9 % — ABNORMAL LOW (ref 39.0–52.0)
Hemoglobin: 7.4 g/dL — ABNORMAL LOW (ref 13.0–17.0)
MCH: 25.4 pg — ABNORMAL LOW (ref 26.0–34.0)
MCHC: 33.8 g/dL (ref 30.0–36.0)
MCV: 75.3 fL — ABNORMAL LOW (ref 80.0–100.0)
Platelets: 369 10*3/uL (ref 150–400)
RBC: 2.91 MIL/uL — ABNORMAL LOW (ref 4.22–5.81)
RDW: 18.1 % — ABNORMAL HIGH (ref 11.5–15.5)
WBC: 8.5 10*3/uL (ref 4.0–10.5)
nRBC: 0 % (ref 0.0–0.2)

## 2022-01-12 NOTE — Progress Notes (Signed)
PROGRESS NOTE    Steven Pham  [id number] DOB: 1961/05/09 DOA: 01/03/2022 PCP: [facility]     Brief Narrative:  Steven Pham is a 61 y.o. male with medical history significant for  gout, HTN, combined CHF recently diagnosed with EF 20 to 25% and grade 3 DD, who presented to the emergency room with chest pain and a feeling of her heart fluttering.  He was seen by Dr. Fletcher Anon who sent him to the emergency room for atrial fibrillation with rapid ventricular response.  Patient was initially treated with IV amiodarone drip, IV heparin drip.  Cardiology consulted.  Patient was also diagnosed with portal vein thrombosis, vascular surgery consulted.  Also with polymicrobial bacteremia, infectious disease consulted.  New events last 24 hours / Subjective: States that his left wrist is improving, continues to have bilateral lower extremity edema.  He has had bowel movements, no hematochezia or melena  Assessment & Plan:   Principal Problem:   Atrial fibrillation with rapid ventricular response (HCC) Active Problems:   Acute combined systolic and diastolic congestive heart failure (HCC)   Portal vein thrombosis   HTN (hypertension)   Long term current use of anticoagulant therapy   HFrEF (heart failure with reduced ejection fraction) (HCC)   A. fib RVR -CHA2DS2-VASc score 3 -Initially treated with amiodarone drip, now on p.o. amiodarone -Continue Eliquis -Cardiology signed off 1/13, follow-up as outpatient  Acute on chronic combined systolic, diastolic heart failure -Echocardiogram with EF 20 to 123456, grade 3 diastolic dysfunction -He will eventually need heart cath -Diuretic is limited by his marginal blood pressure -BNP 341.9 -TED hose  Sepsis with strep and E. coli bacteremia -ID following, recommended for 4-week course of ertapenem on discharge -Repeat blood cultures negative to date  -PICC placed 1/12  Septic portal vein thrombosis -Vascular surgery following  peripherally -Continue Eliquis  Anemia of chronic disease -No source of overt blood loss seen or reported -Status post 1 unit packed red blood cell -Continue to monitor closely -FOBT pending -Iron levels consistent with anemia of chronic disease  CKD stage IIIa -Baseline Cr 1.1 -Stable  Gout flare -Colchicine  -Improving   DVT prophylaxis:  apixaban (ELIQUIS) tablet 5 mg  Code Status: Full code Family Communication: No family at bedside Disposition Plan:  Status is: Inpatient  Remains inpatient appropriate because: BP on the lower side today, diuretic therapy is limited due to his hypotension.  Has some pedal edema, add TED hose today.  Hopeful discharge home 1/15 with home health to follow on Monday   Antimicrobials:  Anti-infectives (From admission, onward)    Start     Dose/Rate Route Frequency Ordered Stop   01/11/22 1200  ertapenem (INVANZ) 1,000 mg in sodium chloride 0.9 % 100 mL IVPB        1 g 200 mL/hr over 30 Minutes Intravenous Every 24 hours 01/11/22 0837     01/08/22 1400  Ampicillin-Sulbactam (UNASYN) 3 g in sodium chloride 0.9 % 100 mL IVPB  Status:  Discontinued        3 g 200 mL/hr over 30 Minutes Intravenous Every 6 hours 01/08/22 1324 01/11/22 0837   01/06/22 1400  piperacillin-tazobactam (ZOSYN) IVPB 3.375 g  Status:  Discontinued        3.375 g 12.5 mL/hr over 240 Minutes Intravenous Every 8 hours 01/06/22 1303 01/08/22 1312   01/05/22 1900  cefTRIAXone (ROCEPHIN) 2 g in sodium chloride 0.9 % 100 mL IVPB  Status:  Discontinued  2 g 200 mL/hr over 30 Minutes Intravenous Every 24 hours 01/05/22 1810 01/06/22 1300        Objective: Vitals:   01/11/22 1918 01/11/22 2316 01/12/22 0353 01/12/22 0717  BP: 103/66 107/64 98/67 (!) 85/74  Pulse: 70 66 66 67  Resp: 17 19 18 18   Temp: 99.2 F (37.3 C) 98.8 F (37.1 C) 98.8 F (37.1 C) 97.7 F (36.5 C)  TempSrc: Oral Oral Oral   SpO2: 98% 98% 98% 99%  Weight:      Height:         Intake/Output Summary (Last 24 hours) at 01/12/2022 1028 Last data filed at 01/12/2022 0900 Gross per 24 hour  Intake 1440 ml  Output --  Net 1440 ml    Filed Weights   01/03/22 1200  Weight: 106.1 kg   Examination: General exam: Appears calm and comfortable  Respiratory system: Clear to auscultation. Respiratory effort normal. Cardiovascular system: S1 & S2 heard, RRR. Bilateral pedal edema. Gastrointestinal system: Abdomen is nondistended, soft and nontender. Normal bowel sounds heard. Central nervous system: Alert and oriented. Non focal exam. Speech clear  Extremities: Left wrist stiffness and edema improved from yesterday's examination Psychiatry: Judgement and insight appear stable. Mood & affect appropriate.    Data Reviewed: I have personally reviewed following labs and imaging studies  CBC: Recent Labs  Lab 01/08/22 0605 01/09/22 0427 01/10/22 0606 01/11/22 0543 01/12/22 0409  WBC 18.3* 15.6* 11.5* 11.7* 8.5  HGB 6.9* 7.9* 8.0* 7.6* 7.4*  HCT 20.2* 23.0* 23.8* 23.1* 21.9*  MCV 71.4* 72.1* 73.2* 74.8* 75.3*  PLT 264 329 349 376 0000000    Basic Metabolic Panel: Recent Labs  Lab 01/06/22 0436 01/07/22 0542 01/08/22 0605 01/10/22 0606 01/11/22 0543 01/12/22 0409  NA 134* 134* 134* 134* 135 133*  K 4.0 4.0 3.7 4.0 4.0 3.6  CL 106 105 104 104 103 105  CO2 21* 24 23 23 24 23   GLUCOSE 117* 114* 89 91 99 122*  BUN 15 13 15 8 9 9   CREATININE 1.37* 1.33* 1.51* 1.20 1.21 1.10  CALCIUM 7.7* 7.7* 7.6* 8.0* 8.2* 8.2*  MG 1.9 2.0 1.9  --   --   --   PHOS  --   --  3.7  --   --   --     GFR: Estimated Creatinine Clearance: 77.4 mL/min (by C-G formula based on SCr of 1.1 mg/dL). Liver Function Tests: Recent Labs  Lab 01/07/22 0542  AST 23  ALT 29  ALKPHOS 156*  BILITOT 2.4*  PROT 6.3*  ALBUMIN 1.7*    No results for input(s): LIPASE, AMYLASE in the last 168 hours. No results for input(s): AMMONIA in the last 168 hours. Coagulation Profile: Recent  Labs  Lab 01/07/22 1529  INR 1.3*    Cardiac Enzymes: No results for input(s): CKTOTAL, CKMB, CKMBINDEX, TROPONINI in the last 168 hours. BNP (last 3 results) No results for input(s): PROBNP in the last 8760 hours. HbA1C: No results for input(s): HGBA1C in the last 72 hours. CBG: Recent Labs  Lab 01/05/22 1256  GLUCAP 161*    Lipid Profile: No results for input(s): CHOL, HDL, LDLCALC, TRIG, CHOLHDL, LDLDIRECT in the last 72 hours. Thyroid Function Tests: No results for input(s): TSH, T4TOTAL, FREET4, T3FREE, THYROIDAB in the last 72 hours. Anemia Panel: Recent Labs    01/11/22 0543 01/12/22 0409  FERRITIN  --  514*  TIBC  --  214*  IRON  --  24*  RETICCTPCT 2.1  --  Sepsis Labs: Recent Labs  Lab 01/05/22 1255 01/06/22 0436 01/07/22 0542  LATICACIDVEN 2.7* 1.1 0.7     Recent Results (from the past 240 hour(s))  Resp Panel by RT-PCR (Flu A&B, Covid) Nasopharyngeal Swab     Status: None   Collection Time: 01/03/22  9:25 AM   Specimen: Nasopharyngeal Swab; Nasopharyngeal(NP) swabs in vial transport medium  Result Value Ref Range Status   SARS Coronavirus 2 by RT PCR NEGATIVE NEGATIVE Final    Comment: (NOTE) SARS-CoV-2 target nucleic acids are NOT DETECTED.  The SARS-CoV-2 RNA is generally detectable in upper respiratory specimens during the acute phase of infection. The lowest concentration of SARS-CoV-2 viral copies this assay can detect is 138 copies/mL. A negative result does not preclude SARS-Cov-2 infection and should not be used as the sole basis for treatment or other patient management decisions. A negative result may occur with  improper specimen collection/handling, submission of specimen other than nasopharyngeal swab, presence of viral mutation(s) within the areas targeted by this assay, and inadequate number of viral copies(<138 copies/mL). A negative result must be combined with clinical observations, patient history, and  epidemiological information. The expected result is Negative.  Fact Sheet for Patients:  EntrepreneurPulse.com.au  Fact Sheet for Healthcare Providers:  IncredibleEmployment.be  This test is no t yet approved or cleared by the [place] FDA and  has been authorized for detection and/or diagnosis of SARS-CoV-2 by FDA under an Emergency Use Authorization (EUA). This EUA will remain  in effect (meaning this test can be used) for the duration of the COVID-19 declaration under Section 564(b)(1) of the Act, 21 U.S.C.section 360bbb-3(b)(1), unless the authorization is terminated  or revoked sooner.       Influenza A by PCR NEGATIVE NEGATIVE Final   Influenza B by PCR NEGATIVE NEGATIVE Final    Comment: (NOTE) The Xpert Xpress SARS-CoV-2/FLU/RSV plus assay is intended as an aid in the diagnosis of influenza from Nasopharyngeal swab specimens and should not be used as a sole basis for treatment. Nasal washings and aspirates are unacceptable for Xpert Xpress SARS-CoV-2/FLU/RSV testing.  Fact Sheet for Patients: EntrepreneurPulse.com.au  Fact Sheet for Healthcare Providers: IncredibleEmployment.be  This test is not yet approved or cleared by the [place] FDA and has been authorized for detection and/or diagnosis of SARS-CoV-2 by FDA under an Emergency Use Authorization (EUA). This EUA will remain in effect (meaning this test can be used) for the duration of the COVID-19 declaration under Section 564(b)(1) of the Act, 21 U.S.C. section 360bbb-3(b)(1), unless the authorization is terminated or revoked.  Performed at [facility], [address] [place], [place] [address]   Culture, blood (Routine X 2) w Reflex to ID Panel     Status: Abnormal   Collection Time: 01/05/22  6:05 AM   Specimen: BLOOD  Result Value Ref Range Status   Specimen Description   Final    BLOOD BLOOD RIGHT  HAND Performed at [facility], [address], [place], [place] [address]    Special Requests   Final    BOTTLES DRAWN AEROBIC AND ANAEROBIC Blood Culture adequate volume Performed at [facility], [address] [place], [place] [address]    Culture  Setup Time   Final    Organism ID to follow GRAM NEGATIVE RODS GRAM POSITIVE COCCI IN PAIRS IN BOTH AEROBIC AND ANAEROBIC BOTTLES CRITICAL RESULT CALLED TO, READ BACK BY AND VERIFIED WITH: ABBY ELLINGTON ON 01/05/22 AT 1736 QSD CRITICAL RESULT CALLED TO, READ  BACK BY AND VERIFIED WITH: PHARM D L.KLUTZ ON [id number] AT 0948 BY E.PARRISH    Culture (A)  Final    ESCHERICHIA COLI STREPTOCOCCUS ANGINOSIS THE SIGNIFICANCE OF ISOLATING THIS ORGANISM FROM A SINGLE SET OF BLOOD CULTURES WHEN MULTIPLE SETS ARE DRAWN IS UNCERTAIN. PLEASE NOTIFY THE MICROBIOLOGY DEPARTMENT WITHIN ONE WEEK IF SPECIATION AND SENSITIVITIES ARE REQUIRED. Performed at [facility], [place] [address], [place], [place] [address]    Report Status 01/10/2022 FINAL  Final   Organism ID, Bacteria ESCHERICHIA COLI  Final      Susceptibility   Escherichia coli - MIC*    AMPICILLIN <=2 SENSITIVE Sensitive     CEFAZOLIN <=4 SENSITIVE Sensitive     CEFEPIME <=0.12 SENSITIVE Sensitive     CEFTAZIDIME <=1 SENSITIVE Sensitive     CEFTRIAXONE <=0.25 SENSITIVE Sensitive     CIPROFLOXACIN <=0.25 SENSITIVE Sensitive     GENTAMICIN <=1 SENSITIVE Sensitive     IMIPENEM <=0.25 SENSITIVE Sensitive     TRIMETH/SULFA <=20 SENSITIVE Sensitive     AMPICILLIN/SULBACTAM <=2 SENSITIVE Sensitive     PIP/TAZO <=4 SENSITIVE Sensitive     * ESCHERICHIA COLI  Blood Culture ID Panel (Reflexed)     Status: Abnormal   Collection Time: 01/05/22  6:05 AM  Result Value Ref Range Status   Enterococcus faecalis NOT DETECTED NOT DETECTED Final   Enterococcus Faecium NOT DETECTED NOT DETECTED Final   Listeria monocytogenes NOT DETECTED NOT DETECTED Final   Staphylococcus species  NOT DETECTED NOT DETECTED Final   Staphylococcus aureus (BCID) NOT DETECTED NOT DETECTED Final   Staphylococcus epidermidis NOT DETECTED NOT DETECTED Final   Staphylococcus lugdunensis NOT DETECTED NOT DETECTED Final   Streptococcus species DETECTED (A) NOT DETECTED Final    Comment: Not Enterococcus species, Streptococcus agalactiae, Streptococcus pyogenes, or Streptococcus pneumoniae. CRITICAL RESULT CALLED TO, READ BACK BY AND VERIFIED WITH: ABBY ELLINGTON ON 01/05/22 AT 1736 QSD    Streptococcus agalactiae NOT DETECTED NOT DETECTED Final   Streptococcus pneumoniae NOT DETECTED NOT DETECTED Final   Streptococcus pyogenes NOT DETECTED NOT DETECTED Final   A.calcoaceticus-baumannii NOT DETECTED NOT DETECTED Final   Bacteroides fragilis NOT DETECTED NOT DETECTED Final   Enterobacterales DETECTED (A) NOT DETECTED Final    Comment: Enterobacterales represent a large order of gram negative bacteria, not a single organism. CRITICAL RESULT CALLED TO, READ BACK BY AND VERIFIED WITH: ABBY ELLINGTON ON 01/05/22 AT 1736 QSD    Enterobacter cloacae complex NOT DETECTED NOT DETECTED Final   Escherichia coli DETECTED (A) NOT DETECTED Final    Comment: CRITICAL RESULT CALLED TO, READ BACK BY AND VERIFIED WITH: ABBY ELLINGTON ON 01/05/22 AT 1736 QSD    Klebsiella aerogenes NOT DETECTED NOT DETECTED Final   Klebsiella oxytoca NOT DETECTED NOT DETECTED Final   Klebsiella pneumoniae NOT DETECTED NOT DETECTED Final   Proteus species NOT DETECTED NOT DETECTED Final   Salmonella species NOT DETECTED NOT DETECTED Final   Serratia marcescens NOT DETECTED NOT DETECTED Final   Haemophilus influenzae NOT DETECTED NOT DETECTED Final   Neisseria meningitidis NOT DETECTED NOT DETECTED Final   Pseudomonas aeruginosa NOT DETECTED NOT DETECTED Final   Stenotrophomonas maltophilia NOT DETECTED NOT DETECTED Final   Candida albicans NOT DETECTED NOT DETECTED Final   Candida auris NOT DETECTED NOT DETECTED Final    Candida glabrata NOT DETECTED NOT DETECTED Final   Candida krusei NOT DETECTED NOT DETECTED Final   Candida parapsilosis NOT DETECTED NOT DETECTED Final   Candida tropicalis NOT  DETECTED NOT DETECTED Final   Cryptococcus neoformans/gattii NOT DETECTED NOT DETECTED Final   CTX-M ESBL NOT DETECTED NOT DETECTED Final   Carbapenem resistance IMP NOT DETECTED NOT DETECTED Final   Carbapenem resistance KPC NOT DETECTED NOT DETECTED Final   Carbapenem resistance NDM NOT DETECTED NOT DETECTED Final   Carbapenem resist OXA 48 LIKE NOT DETECTED NOT DETECTED Final   Carbapenem resistance VIM NOT DETECTED NOT DETECTED Final    Comment: Performed at [facility], [place], [place] [address]  SARS CORONAVIRUS 2 (TAT 6-24 HRS) Nasopharyngeal Nasopharyngeal Swab     Status: None   Collection Time: 01/07/22  1:10 AM   Specimen: Nasopharyngeal Swab  Result Value Ref Range Status   SARS Coronavirus 2 NEGATIVE NEGATIVE Final    Comment: (NOTE) SARS-CoV-2 target nucleic acids are NOT DETECTED.  The SARS-CoV-2 RNA is generally detectable in upper and lower respiratory specimens during the acute phase of infection. Negative results do not preclude SARS-CoV-2 infection, do not rule out co-infections with other pathogens, and should not be used as the sole basis for treatment or other patient management decisions. Negative results must be combined with clinical observations, patient history, and epidemiological information. The expected result is Negative.  Fact Sheet for Patients: SugarRoll.be  Fact Sheet for Healthcare Providers: https://www.woods-mathews.com/  This test is not yet approved or cleared by the [place] FDA and  has been authorized for detection and/or diagnosis of SARS-CoV-2 by FDA under an Emergency Use Authorization (EUA). This EUA will remain  in effect (meaning this test can be used) for the duration of  the COVID-19 declaration under Se ction 564(b)(1) of the Act, 21 U.S.C. section 360bbb-3(b)(1), unless the authorization is terminated or revoked sooner.  Performed at [facility], [place] [address] [place], [place] [address]   CULTURE, BLOOD (ROUTINE X 2) w Reflex to ID Panel     Status: None (Preliminary result)   Collection Time: 01/09/22 11:56 AM   Specimen: BLOOD  Result Value Ref Range Status   Specimen Description BLOOD BLOOD RIGHT HAND  Final   Special Requests   Final    BOTTLES DRAWN AEROBIC AND ANAEROBIC Blood Culture results may not be optimal due to an excessive volume of blood received in culture bottles   Culture   Final    NO GROWTH 3 DAYS Performed at [facility], [address], [place], [place] [address]    Report Status PENDING  Incomplete  CULTURE, BLOOD (ROUTINE X 2) w Reflex to ID Panel     Status: None (Preliminary result)   Collection Time: 01/09/22 12:03 PM   Specimen: BLOOD  Result Value Ref Range Status   Specimen Description BLOOD BLOOD LEFT HAND  Final   Special Requests   Final    BOTTLES DRAWN AEROBIC AND ANAEROBIC Blood Culture adequate volume   Culture   Final    NO GROWTH 3 DAYS Performed at [facility], [address] [place], [place] [address]    Report Status PENDING  Incomplete       Radiology Studies: No results found.    Scheduled Meds:  amiodarone  200 mg Oral BID   apixaban  5 mg Oral BID   Chlorhexidine Gluconate Cloth  6 each Topical Daily   colchicine  0.6 mg Oral BID   ferrous sulfate  325 mg Oral Q breakfast   polyethylene glycol  17 g Oral Daily   sodium chloride flush  10-40 mL  Intracatheter Q12H   Continuous Infusions:  sodium chloride 10 mL/hr at 01/11/22 0415   ertapenem 1,000 mg (01/12/22 0943)     LOS: 9 days     Dessa Phi, DO [facility] 01/12/2022, 10:28 AM   Available via Epic secure chat 7am-7pm After these hours, please refer to coverage provider listed on  [email or web address]

## 2022-01-12 NOTE — Plan of Care (Signed)
°  Problem: Education: Goal: Knowledge of General Education information will improve Description: Including pain rating scale, medication(s)/side effects and non-pharmacologic comfort measures Outcome: Progressing   Problem: Health Behavior/Discharge Planning: Goal: Ability to manage health-related needs will improve Outcome: Progressing   Problem: Clinical Measurements: Goal: Ability to maintain clinical measurements within normal limits will improve Outcome: Progressing Goal: Will remain free from infection Outcome: Progressing Goal: Diagnostic test results will improve Outcome: Progressing Goal: Respiratory complications will improve Outcome: Progressing Goal: Cardiovascular complication will be avoided Outcome: Progressing   Problem: Nutrition: Goal: Adequate nutrition will be maintained Outcome: Progressing   Problem: Coping: Goal: Level of anxiety will decrease Outcome: Progressing   Problem: Safety: Goal: Ability to remain free from injury will improve Outcome: Progressing   Problem: Skin Integrity: Goal: Risk for impaired skin integrity will decrease Outcome: Progressing   Problem: Education: Goal: Knowledge of disease or condition will improve Outcome: Progressing Goal: Understanding of medication regimen will improve Outcome: Progressing Goal: Individualized Educational Video(s) Outcome: Progressing   Problem: Activity: Goal: Ability to tolerate increased activity will improve Outcome: Progressing   Problem: Cardiac: Goal: Ability to achieve and maintain adequate cardiopulmonary perfusion will improve Outcome: Progressing   Problem: Activity: Goal: Risk for activity intolerance will decrease Outcome: Not Progressing   Problem: Elimination: Goal: Will not experience complications related to bowel motility Outcome: Not Progressing Goal: Will not experience complications related to urinary retention Outcome: Not Progressing   Problem: Pain  Managment: Goal: General experience of comfort will improve Outcome: Not Progressing

## 2022-01-13 DIAGNOSIS — I4891 Unspecified atrial fibrillation: Secondary | ICD-10-CM | POA: Diagnosis not present

## 2022-01-13 LAB — CBC
HCT: 23.2 % — ABNORMAL LOW (ref 39.0–52.0)
Hemoglobin: 7.7 g/dL — ABNORMAL LOW (ref 13.0–17.0)
MCH: 25.5 pg — ABNORMAL LOW (ref 26.0–34.0)
MCHC: 33.2 g/dL (ref 30.0–36.0)
MCV: 76.8 fL — ABNORMAL LOW (ref 80.0–100.0)
Platelets: 422 10*3/uL — ABNORMAL HIGH (ref 150–400)
RBC: 3.02 MIL/uL — ABNORMAL LOW (ref 4.22–5.81)
RDW: 18.5 % — ABNORMAL HIGH (ref 11.5–15.5)
WBC: 6.3 10*3/uL (ref 4.0–10.5)
nRBC: 0 % (ref 0.0–0.2)

## 2022-01-13 LAB — BASIC METABOLIC PANEL
Anion gap: 8 (ref 5–15)
BUN: 9 mg/dL (ref 6–20)
CO2: 22 mmol/L (ref 22–32)
Calcium: 8.4 mg/dL — ABNORMAL LOW (ref 8.9–10.3)
Chloride: 102 mmol/L (ref 98–111)
Creatinine, Ser: 1.2 mg/dL (ref 0.61–1.24)
GFR, Estimated: >60 mL/min (ref >60)
Glucose, Bld: 97 mg/dL (ref 70–99)
Potassium: 3.9 mmol/L (ref 3.5–5.1)
Sodium: 132 mmol/L — ABNORMAL LOW (ref 135–145)

## 2022-01-13 MED ORDER — FERROUS SULFATE 325 (65 FE) MG PO TABS: 325.0000 mg | ORAL_TABLET | Freq: Every day | ORAL | 0 refills | Status: DC

## 2022-01-13 MED ORDER — APIXABAN 5 MG PO TABS: 5.0000 mg | ORAL_TABLET | Freq: Two times a day (BID) | ORAL | 0 refills | Status: DC

## 2022-01-13 MED ORDER — ERTAPENEM IV (FOR PTA / DISCHARGE USE ONLY): 1.0000 g | INTRAVENOUS | 0 refills | Status: DC

## 2022-01-13 MED ORDER — AMIODARONE HCL 200 MG PO TABS: 200.0000 mg | ORAL_TABLET | Freq: Two times a day (BID) | ORAL | 0 refills | Status: DC

## 2022-01-13 NOTE — Discharge Summary (Signed)
Physician Discharge Summary  Steven Pham MBT:[id number] DOB: February 01, 1961 DOA: 01/03/2022  PCP: [facility] [place] date: 01/03/2022 Discharge date: 01/13/2022  Admitted From: Home Disposition:  Home with home RN for IV antibiotic   Recommendations for Outpatient Follow-up:  Follow up with PCP in 1 week Follow up with ID Dr. Delaine Lame 1/23 Follow up with Cardiology Dr. Fletcher Anon  Follow up with Vascular surgery Dr. Lowella Grip   Discharge Condition: Stable CODE STATUS: Full  Diet recommendation:  Diet Orders (From admission, onward)     Start     Ordered   01/13/22 0000  Diet - low sodium heart healthy        01/13/22 0935   01/07/22 1206  Diet Heart Room service appropriate? Yes; Fluid consistency: Thin  Diet effective now       Question Answer Comment  Room service appropriate? Yes   Fluid consistency: Thin      01/07/22 1205            Brief/Interim Summary: Steven Pham is a 61 y.o. male with medical history significant for  gout, HTN, combined CHF recently diagnosed with EF 20 to 25% and grade 3 DD, who presented to the emergency room with chest pain and a feeling of her heart fluttering.  He was seen by Dr. Fletcher Anon who sent him to the emergency room for atrial fibrillation with rapid ventricular response.  Patient was initially treated with IV amiodarone drip, IV heparin drip.  Cardiology consulted.  Patient was also diagnosed with portal vein thrombosis, vascular surgery consulted.  Also with polymicrobial bacteremia, infectious disease consulted. Patient's anticoagulation was switched to lovenox and then eliquis. He had anemia requiring 1u pRBC transfusion, no overt blood loss reported and Hgb remained low but stable. Antibiotic course was recommended for 4 weeks of ertapenem. He also had gout requiring colchicine.   Discharge Diagnoses:  Principal Problem:   Atrial fibrillation with rapid ventricular response (HCC) Active Problems:   Acute combined systolic and  diastolic congestive heart failure (HCC)   Portal vein thrombosis   HTN (hypertension)   Long term current use of anticoagulant therapy   HFrEF (heart failure with reduced ejection fraction) (HCC)   A. fib RVR -CHA2DS2-VASc score 3 -Initially treated with amiodarone drip, now on p.o. amiodarone -Continue Eliquis -Cardiology signed off 1/13, follow-up as outpatient  Acute on chronic combined systolic, diastolic heart failure -Echocardiogram with EF 20 to 53%, grade 3 diastolic dysfunction -He will eventually need heart cath -Diuretic is limited by his marginal blood pressure -BNP 341.9 -TED hose   Sepsis with strep and E. coli bacteremia -ID following, recommended for 4-week course of ertapenem on discharge -Repeat blood cultures negative to date  -PICC placed 1/12   Septic portal vein thrombosis -Vascular surgery following peripherally -Continue Eliquis  Anemia of chronic disease -No source of overt blood loss seen or reported -Status post 1 unit packed red blood cell -Continue to monitor closely -Iron levels consistent with anemia of chronic disease  CKD stage IIIa -Baseline Cr 1.1 -Stable   Gout flare -Improving    Discharge Instructions  Discharge Instructions     (HEART FAILURE PATIENTS) Call MD:  Anytime you have any of the following symptoms: 1) 3 pound weight gain in 24 hours or 5 pounds in 1 week 2) shortness of breath, with or without a dry hacking cough 3) swelling in the hands, feet or stomach 4) if you have to sleep on extra pillows at night in order to  breathe.   Complete by: As directed    [facility] pharmacist to adjust dose for Vancomycin, Aminoglycosides and other anti-infective therapies as requested by physician.   Complete by: As directed    [facility] to provide Cath Flo 39m   Complete by: As directed    Administer for PICC line occlusion and as ordered by physician for other access device issues.   Amb referral to  [facility]   Complete by: As directed    Anaphylaxis Kit: Provided to treat any anaphylactic reaction to the medication being provided to the patient if First Dose or when requested by physician   Complete by: As directed    Epinephrine 146mml vial / amp: Administer 0.51m55m0.51ml55mubcutaneously once for moderate to severe anaphylaxis, nurse to call physician and pharmacy when reaction occurs and call 911 if needed for immediate care   Diphenhydramine 50mg71mIV vial: Administer 25-50mg 72mM PRN for first dose reaction, rash, itching, mild reaction, nurse to call physician and pharmacy when reaction occurs   Sodium Chloride 0.9% NS 500ml I70mdminister if needed for hypovolemic blood pressure drop or as ordered by physician after call to physician with anaphylactic reaction   Call MD for:  difficulty breathing, headache or visual disturbances   Complete by: As directed    Call MD for:  extreme fatigue   Complete by: As directed    Call MD for:  persistant dizziness or light-headedness   Complete by: As directed    Call MD for:  persistant nausea and vomiting   Complete by: As directed    Call MD for:  severe uncontrolled pain   Complete by: As directed    Call MD for:  temperature >100.4   Complete by: As directed    Change dressing on IV access line weekly and PRN   Complete by: As directed    Diet - low sodium heart healthy   Complete by: As directed    Discharge instructions   Complete by: As directed    You were cared for by a hospitalist during your hospital stay. If you have any questions about your discharge medications or the care you received while you were in the hospital after you are discharged, you can call the unit and ask to speak with the hospitalist on call if the hospitalist that took care of you is not available. Once you are discharged, your primary care physician will handle any further medical issues. Please note that NO REFILLS for any discharge medications will be  authorized once you are discharged, as it is imperative that you return to your primary care physician (or establish a relationship with a primary care physician if you do not have one) for your aftercare needs so that they can reassess your need for medications and monitor your lab values.   Flush IV access with Sodium Chloride 0.9% and Heparin 10 units/ml or 100 units/ml   Complete by: As directed    Home infusion instructions - [facility]   Complete by: As directed    Instructions: Flush IV access with Sodium Chloride 0.9% and Heparin 10units/ml or 100units/ml   Change dressing on IV access line: Weekly and PRN   Instructions Cath Flo 2mg: Ad32mister for PICC Line occlusion and as ordered by physician for other access device   [facility] pharmacist to adjust dose for: Vancomycin, Aminoglycosides and other anti-infective therapies as requested by physician   Increase activity slowly   Complete  by: As directed    Method of administration may be changed at the discretion of home infusion pharmacist based upon assessment of the patient and/or caregivers ability to self-administer the medication ordered   Complete by: As directed       Allergies as of 01/13/2022   No Known Allergies      Medication List     STOP taking these medications    amLODipine 5 MG tablet Commonly known as: NORVASC   Apixaban Starter Pack (73m and 524m Commonly known as: ELIQUIS STARTER PACK Replaced by: apixaban 5 MG Tabs tablet   carvedilol 6.25 MG tablet Commonly known as: COREG   colchicine 0.6 MG tablet   losartan 25 MG tablet Commonly known as: COZAAR   spironolactone 25 MG tablet Commonly known as: ALDACTONE       TAKE these medications    amiodarone 200 MG tablet Commonly known as: PACERONE Take 1 tablet (200 mg total) by mouth 2 (two) times daily.   apixaban 5 MG Tabs tablet Commonly known as: ELIQUIS Take 1 tablet (5 mg total) by mouth 2 (two) times  daily. Replaces: Apixaban Starter Pack (1042mnd 5mg69m ertapenem  IVPB Commonly known as: INVANZ Inject 1 g into the vein daily for 23 days. Indication:  Polymicrobial bacteremia / jejunal diverticulitis with portal vein thrombosis and suppurative pylephlebitis First Dose: Yes Last Day of Therapy:  02/02/22 Labs - Once weekly (Monday or Tuesday):  CBC/D and CMP Method of administration: Mini-Bag Plus / Gravity Method of administration may be changed at the discretion of home infusion pharmacist based upon assessment of the patient and/or caregiver's ability to self-administer the medication ordered. Fax weekly labs to Dr.Ravishankar promptly ([phone] any questions or critical values   ferrous sulfate 325 (65 FE) MG tablet Take 1 tablet (325 mg total) by mouth daily with breakfast.               Discharge Care Instructions  (From admission, onward)           Start     Ordered   01/13/22 0000  Change dressing on IV access line weekly and PRN  (Home infusion instructions - [facility] )        01/13/22 0935[facility] as soon as possible for a visit in 1 week(s).   Specialty: General Practice Contact information: [phone]         [facility] [place]. Schedule an appointment as soon as possible for a visit.   Specialty: Cardiology Contact information: [phone]         RaviTsosie Billing. Go on 01/21/2022.   Specialty: Infectious Diseases Contact information: [phone]         [facility] [place]. Schedule an appointment as soon as possible for a visit.   Specialties: Vascular Surgery, Cardiology, Radiology Contact information: [phone]            No Known Allergies   Procedures/Studies: CT ABDOMEN PELVIS WO CONTRAST  Result Date: 12/26/2021 CLINICAL DATA:  Abdominal pain.  Concern for acute diverticulitis. EXAM: CT ABDOMEN AND PELVIS WITHOUT CONTRAST TECHNIQUE: Multidetector CT imaging of the abdomen  and pelvis was performed following the standard protocol without IV contrast. COMPARISON:  CT abdomen pelvis dated 12/14/2021. FINDINGS: Evaluation of this exam is limited in the absence of intravenous contrast. Lower chest: Minimal bibasilar atelectasis. The visualized lung bases are otherwise clear. No intra-abdominal free air or free fluid. Hepatobiliary: The liver is unremarkable. No intrahepatic dilatation. The gallbladder is contracted. No calcified gallstone. Pancreas: The pancreas is mildly atrophic. Evaluation of the pancreas is limited in the absence of intravenous contrast. Spleen: Normal in size without focal abnormality. Adrenals/Urinary Tract: The adrenal glands are unremarkable. There is no hydronephrosis or nephrolithiasis on either side. There is a 5 cm left renal inferior pole cyst and a smaller hypodensity which is not characterized. The visualized ureters and urinary bladder appear unremarkable. Stomach/Bowel: There is distal colonic diverticulosis without active inflammatory changes. Inflammatory changes of the small bowel mesentery as seen on the prior CT and corresponding to the small-bowel diverticulitis. There is no bowel obstruction. The appendix is normal. Vascular/Lymphatic: Mild aortoiliac atherosclerotic disease. The IVC is unremarkable. There is again air within the SMV and main portal vein. There is high attenuating content in the left portal vein most concerning for portal vein thrombosis. Evaluation is very limited, almost nondiagnostic on a noncontrast CT. Repeat CT with IV contrast in portal venous phase or interrogation of the portal vein with duplex ultrasound may provide  better evaluation. Overall there is increased SMV and portal venous gas compared to prior CT. Reproductive: The prostate and seminal vesicles are grossly unremarkable. No pelvic mass. Other: Small fat containing umbilical hernia Musculoskeletal: With degenerative changes of the spine. No acute osseous pathology. IMPRESSION: 1. Increase in the amount of gas within the SMV and main portal vein with findings concerning for thrombus extending from the main portal vein into the left portal vein. Repeat CT with IV contrast in portal venous phase or interrogation of the portal vein with duplex ultrasound may provide better evaluation. 2. Improved inflammatory changes of the small bowel mesentery since the prior CT. No drainable fluid collection or abscess. 3. Colonic diverticulosis. No bowel obstruction. Normal appendix. 4. Aortic Atherosclerosis (ICD10-I70.0). These results were called by telephone at the time of interpretation on 12/26/2021 at 12:47 am to Dr. Karma Greaser, who verbally acknowledged these results. Electronically Signed   By: Anner Crete M.D.   On: 12/26/2021 00:53   DG Chest 1 View  Result Date: 12/14/2021 CLINICAL DATA:  Upper abdominal pain and vomiting. EXAM: CHEST  1 VIEW COMPARISON:  May 13, 2021 FINDINGS: Very mild, stable atelectasis is seen within the bilateral lung bases. There is no evidence of a pleural effusion or pneumothorax. The heart size and mediastinal contours are within normal limits. The visualized skeletal structures are unremarkable. IMPRESSION: Very mild bibasilar atelectasis. Electronically Signed   By: Virgina Norfolk M.D.   On: 12/14/2021 20:34   DG Chest 2 View  Result Date: 01/03/2022 CLINICAL DATA:  Pt sent from [facility] with a-fib RVR, pt states he has been having palpitations with intermittent chest pain for the past 3 days, denies SOB, nausea or diaphoresis. Hx. htn EXAM: CHEST - 2 VIEW COMPARISON:  12/25/2021 FINDINGS: Lungs are clear. Heart size and mediastinal  contours are within normal limits. No effusion. Visualized bones unremarkable. IMPRESSION: No acute cardiopulmonary disease. Electronically Signed   By: Lucrezia Europe M.D.   On: 01/03/2022 12:47   CT ABDOMEN PELVIS W CONTRAST  Result Date: 01/05/2022 CLINICAL DATA:  History of portal vein thrombosis. EXAM: CT ABDOMEN AND  PELVIS WITH CONTRAST TECHNIQUE: Multidetector CT imaging of the abdomen and pelvis was performed using the standard protocol following bolus administration of intravenous contrast. CONTRAST:  129m OMNIPAQUE IOHEXOL 300 MG/ML  SOLN COMPARISON:  12/26/2021 FINDINGS: Lower chest: Basilar atelectasis bilaterally.  Heart is enlarged. Hepatobiliary: No suspicious focal abnormality within the liver parenchyma. Gallbladder wall is ill-defined with apparent mild pericholecystic edema. No intrahepatic or extrahepatic biliary dilation. Pancreas: No focal mass lesion. No dilatation of the main duct. No intraparenchymal cyst. No peripancreatic edema. Spleen: No splenomegaly. No focal mass lesion. Adrenals/Urinary Tract: No adrenal nodule or mass. Right kidney unremarkable. 4.7 cm cyst lower pole left kidney is similar to prior with 13 mm low-density lower interpolar lesion, also likely a cyst No evidence for hydroureter. The urinary bladder appears normal for the degree of distention. Stomach/Bowel: Stomach is unremarkable. No gastric wall thickening. No evidence of outlet obstruction. Duodenum is normally positioned as is the ligament of Treitz. No small bowel wall thickening. No small bowel dilatation. The terminal ileum is normal. The appendix is normal. No gross colonic mass. No colonic wall thickening. Diverticular changes are noted in the left colon without evidence of diverticulitis. Vascular/Lymphatic: There is mild atherosclerotic calcification of the abdominal aorta without aneurysm. Known thrombus again identified in the main portal vein. Thrombus is identified in the superior mesenteric vein,  extending into the portal splenic confluence up the portal vein and into the left and right branches of the portal venous anatomy. The patient had gas in the portal vein on the previous study but this is progressive in the interval with gas now seen in both the right and left portal vein, main portal vein, and SMV. There is some gas in small mesenteric venous anatomy of the left abdomen (51/2) although no small bowel wall thickening or evidence for pneumatosis. Central mesentery in the left abdomen is mildly congested. Reproductive: The prostate gland and seminal vesicles are unremarkable. Other: No intraperitoneal free fluid. Musculoskeletal: No worrisome lytic or sclerotic osseous abnormality. IMPRESSION: 1. Thrombus again identified in the portal vein with involvement of the superior mesenteric vein and left/right intrahepatic portal venous anatomy. Small foci of peripheral mesenteric venous gas noted in the left abdomen with interval increase in volume of portal venous gas since prior study of 12/26/2021. Portal venous gas can be seen in the setting of bowel ischemia although no evidence for large or small bowel pneumatosis at this time and no small bowel wall thickening. Gas can also be seen in the setting of superinfection. No interloop mesenteric fluid. No intraperitoneal free fluid. 2. Ill-defined gallbladder wall with apparent mild pericholecystic edema. No biliary duct dilatation. If there is clinical concern for acute cholecystitis, right upper quadrant ultrasound could be used to further evaluate. 3. Left renal cysts. 4. Aortic Atherosclerosis (ICD10-I70.0). Electronically Signed   By: EMisty StanleyM.D.   On: 01/05/2022 10:47   CT ABDOMEN PELVIS W CONTRAST  Result Date: 12/14/2021 CLINICAL DATA:  Upper abdominal pain for 2 days EXAM: CT ABDOMEN AND PELVIS WITH CONTRAST TECHNIQUE: Multidetector CT imaging of the abdomen and pelvis was performed using the standard protocol following bolus  administration of intravenous contrast. CONTRAST:  784mOMNIPAQUE IOHEXOL 300 MG/ML  SOLN COMPARISON:  None. FINDINGS: Lower chest: No acute abnormality. Hepatobiliary: No focal liver abnormality is seen. No gallstones, gallbladder wall thickening, or biliary dilatation. Pancreas: Unremarkable. No pancreatic ductal dilatation or surrounding inflammatory changes. Spleen: Normal in size without focal abnormality. Adrenals/Urinary Tract: Adrenal glands are within normal limits.  Kidneys demonstrate a normal enhancement pattern bilaterally. Large simple cyst is noted arising from the lower pole of the left kidney measuring 5 cm in greatest dimension. No obstructive changes are seen. The ureters are within normal limits. The bladder is partially distended. Stomach/Bowel: No obstructive or inflammatory changes of colon are seen. The appendix is within normal limits. Stomach is within normal limits. There are multiple fluid-filled outpouchings in the mid jejunum consistent with small bowel diverticular change. Some associated inflammatory changes are noted in the adjacent mesentery with findings suggestive of early venous air related to the inflammatory change. No definitive portal venous air is noted. Vascular/Lymphatic: Atherosclerotic calcifications are seen. Some inflammatory changes are noted in the root of the small bowel mesentery in the left mid abdomen as described above. Some linear areas of air are noted in the inflamed mesentery and the possibility of early SMV air could not be totally excluded. Reproductive: Prostate is unremarkable. Other: Small fat containing umbilical hernia is noted. No abdominopelvic ascites. Musculoskeletal: No acute or significant osseous findings. IMPRESSION: Changes consistent with small bowel diverticulitis with inflammatory changes in the mesentery in the left mid abdomen as well as some very early venous air adjacent to the small bowel loops. Left renal cyst. Critical Value/emergent  results were called by telephone at the time of interpretation on 12/14/2021 at 10:34 pm to Dr. Conni Slipper , who verbally acknowledged these results. Electronically Signed   By: Inez Catalina M.D.   On: 12/14/2021 22:38   DG Chest Portable 1 View  Result Date: 12/26/2021 CLINICAL DATA:  Bilateral ankle and left wrist pain with gout, arthritis and an abnormal EKG. EXAM: PORTABLE CHEST 1 VIEW COMPARISON:  December 14, 2021 FINDINGS: The heart size and mediastinal contours are within normal limits. Both lungs are clear. The visualized skeletal structures are unremarkable. IMPRESSION: No active disease. Electronically Signed   By: Virgina Norfolk M.D.   On: 12/26/2021 00:04   ECHOCARDIOGRAM COMPLETE  Result Date: 01/06/2022    ECHOCARDIOGRAM REPORT   Patient Name:   Jajuan Beaver Date of Exam: 01/06/2022 Medical Rec #:  [id number]    Height:       63.0 in Accession #:    [id number]   Weight:       234.0 lb Date of Birth:  04-09-1961    BSA:          2.067 m Patient Age:    19 years     BP:           122/76 mmHg Patient Gender: M            HR:           90 bpm. Exam Location:  [facility] Procedure: 2D Echo and Intracardiac Opacification Agent Indications:     Bacteremia R78.81  History:         Patient has prior history of Echocardiogram examinations, most                  recent 12/18/2021.  Sonographer:     Kathlen Brunswick RDCS Referring Phys:  O[place] Trough TDDU Diagnosing Phys: Ida Rogue MD  Sonographer Comments: Technically difficult study due to poor echo windows and suboptimal apical window. Image acquisition challenging due to patient body habitus. IMPRESSIONS  1. No valve vegetation noted.  2. Left ventricular ejection fraction, by estimation, is 30 to 35%. The left ventricle has moderately decreased function. The left ventricle demonstrates global hypokinesis. The left ventricular internal cavity size was  mildly to moderately dilated. Left ventricular diastolic parameters are consistent with Grade  II diastolic dysfunction (pseudonormalization).  3. Right ventricular systolic function is normal. The right ventricular size is normal. There is normal pulmonary artery systolic pressure. The estimated right ventricular systolic pressure is 97.9 mmHg.  4. Left atrial size was moderately dilated.  5. The mitral valve is normal in structure. No evidence of mitral valve regurgitation. No evidence of mitral stenosis.  6. The aortic valve is normal in structure. Aortic valve regurgitation is not visualized. No aortic stenosis is present.  7. The inferior vena cava is normal in size with greater than 50% respiratory variability, suggesting right atrial pressure of 3 mmHg. FINDINGS  Left Ventricle: Left ventricular ejection fraction, by estimation, is 30 to 35%. The left ventricle has moderately decreased function. The left ventricle demonstrates global hypokinesis. Definity contrast agent was given IV to delineate the left ventricular endocardial borders. The left ventricular internal cavity size was mildly to moderately dilated. There is no left ventricular hypertrophy. Left ventricular diastolic parameters are consistent with Grade II diastolic dysfunction (pseudonormalization). Right Ventricle: The right ventricular size is normal. No increase in right ventricular wall thickness. Right ventricular systolic function is normal. There is normal pulmonary artery systolic pressure. The tricuspid regurgitant velocity is 2.46 m/s, and  with an assumed right atrial pressure of 5 mmHg, the estimated right ventricular systolic pressure is 48.0 mmHg. Left Atrium: Left atrial size was moderately dilated. Right Atrium: Right atrial size was normal in size. Pericardium: There is no evidence of pericardial effusion. Mitral Valve: The mitral valve is normal in structure. No evidence of mitral valve regurgitation. No evidence of mitral valve stenosis. Tricuspid Valve: The tricuspid valve is normal in structure. Tricuspid valve  regurgitation is mild . No evidence of tricuspid stenosis. Aortic Valve: The aortic valve is normal in structure. Aortic valve regurgitation is not visualized. No aortic stenosis is present. Aortic valve peak gradient measures 9.1 mmHg. Pulmonic Valve: The pulmonic valve was normal in structure. Pulmonic valve regurgitation is not visualized. No evidence of pulmonic stenosis. Aorta: The aortic root is normal in size and structure. Venous: The inferior vena cava is normal in size with greater than 50% respiratory variability, suggesting right atrial pressure of 3 mmHg. IAS/Shunts: No atrial level shunt detected by color flow Doppler.  LEFT VENTRICLE PLAX 2D LVIDd:         6.20 cm      Diastology LVIDs:         4.75 cm      LV e' medial:    7.72 cm/s LV PW:         1.20 cm      LV E/e' medial:  11.8 LV IVS:        1.00 cm      LV e' lateral:   11.30 cm/s LVOT diam:     2.10 cm      LV E/e' lateral: 8.1 LV SV:         43 LV SV Index:   21 LVOT Area:     3.46 cm  LV Volumes (MOD) LV vol d, MOD A2C: 171.0 ml LV vol d, MOD A4C: 150.0 ml LV vol s, MOD A2C: 99.0 ml LV vol s, MOD A4C: 76.2 ml LV SV MOD A2C:     72.0 ml LV SV MOD A4C:     150.0 ml LV SV MOD BP:      72.1 ml RIGHT VENTRICLE RV Basal diam:  4.10 cm RV S prime:     17.00 cm/s TAPSE (M-mode): 2.3 cm LEFT ATRIUM           Index        RIGHT ATRIUM           Index LA diam:      5.30 cm 2.56 cm/m   RA Area:     23.40 cm LA Vol (A2C): 40.7 ml 19.69 ml/m  RA Volume:   72.70 ml  35.17 ml/m LA Vol (A4C): 56.4 ml 27.28 ml/m  AORTIC VALVE                 PULMONIC VALVE AV Area (Vmax): 1.83 cm     PV Vmax:       1.42 m/s AV Vmax:        151.00 cm/s  PV Peak grad:  8.1 mmHg AV Peak Grad:   9.1 mmHg LVOT Vmax:      79.60 cm/s LVOT Vmean:     54.600 cm/s LVOT VTI:       0.125 m  AORTA Ao Root diam: 3.80 cm Ao Asc diam:  3.70 cm MITRAL VALVE               TRICUSPID VALVE MV Area (PHT): 4.33 cm    TV Peak grad:   24.2 mmHg MV Decel Time: 175 msec    TV Vmax:        2.46  m/s MV E velocity: 91.30 cm/s  TR Peak grad:   24.2 mmHg MV A velocity: 53.10 cm/s  TR Vmax:        246.00 cm/s MV E/A ratio:  1.72                            SHUNTS                            Systemic VTI:  0.12 m                            Systemic Diam: 2.10 cm Ida Rogue MD Electronically signed by Ida Rogue MD Signature Date/Time: 01/06/2022/3:30:27 PM    Final    ECHOCARDIOGRAM COMPLETE  Result Date: 12/18/2021    ECHOCARDIOGRAM REPORT   Patient Name:   Trenton Lutes Date of Exam: 12/18/2021 Medical Rec #:  [id number]    Height:       62.0 in Accession #:    [id number]   Weight:       256.0 lb Date of Birth:  Feb 21, 1961    BSA:          2.123 m Patient Age:    48 years     BP:           114/86 mmHg Patient Gender: M            HR:           82 bpm. Exam Location:  [facility] Procedure: 2D Echo, Cardiac Doppler and Color Doppler Indications:     Abnormal ECG R 94.31  History:         Patient has no prior history of Echocardiogram examinations.                  Risk Factors:Hypertension.  Sonographer:     Sherrie Sport Referring Phys:  [id number] [facility] Diagnosing Phys: Rogue Jury  Fletcher Anon MD  Sonographer Comments: Suboptimal apical window. IMPRESSIONS  1. Left ventricular ejection fraction, by estimation, is 20 to 25%. The left ventricle has severely decreased function. The left ventricle demonstrates global hypokinesis. The left ventricular internal cavity size was mildly dilated. There is mild left ventricular hypertrophy. Left ventricular diastolic parameters are consistent with Grade III diastolic dysfunction (restrictive).  2. Right ventricular systolic function is normal. The right ventricular size is normal. There is mildly elevated pulmonary artery systolic pressure.  3. Left atrial size was severely dilated.  4. Right atrial size was mildly dilated.  5. The mitral valve is normal in structure. Mild to moderate mitral valve regurgitation. No evidence of mitral stenosis.  6. The aortic valve is normal in  structure. Aortic valve regurgitation is not visualized. No aortic stenosis is present.  7. The inferior vena cava is normal in size with greater than 50% respiratory variability, suggesting right atrial pressure of 3 mmHg. FINDINGS  Left Ventricle: Left ventricular ejection fraction, by estimation, is 20 to 25%. The left ventricle has severely decreased function. The left ventricle demonstrates global hypokinesis. The left ventricular internal cavity size was mildly dilated. There is mild left ventricular hypertrophy. Left ventricular diastolic parameters are consistent with Grade III diastolic dysfunction (restrictive). Right Ventricle: The right ventricular size is normal. No increase in right ventricular wall thickness. Right ventricular systolic function is normal. There is mildly elevated pulmonary artery systolic pressure. The tricuspid regurgitant velocity is 3.22  m/s, and with an assumed right atrial pressure of 3 mmHg, the estimated right ventricular systolic pressure is 14.9 mmHg. Left Atrium: Left atrial size was severely dilated. Right Atrium: Right atrial size was mildly dilated. Pericardium: There is no evidence of pericardial effusion. Mitral Valve: The mitral valve is normal in structure. Mild to moderate mitral valve regurgitation. No evidence of mitral valve stenosis. Tricuspid Valve: The tricuspid valve is normal in structure. Tricuspid valve regurgitation is mild . No evidence of tricuspid stenosis. Aortic Valve: The aortic valve is normal in structure. Aortic valve regurgitation is not visualized. No aortic stenosis is present. Aortic valve mean gradient measures 3.0 mmHg. Aortic valve peak gradient measures 4.0 mmHg. Aortic valve area, by VTI measures 2.50 cm. Pulmonic Valve: The pulmonic valve was normal in structure. Pulmonic valve regurgitation is not visualized. No evidence of pulmonic stenosis. Aorta: The aortic root is normal in size and structure. Venous: The inferior vena cava is  normal in size with greater than 50% respiratory variability, suggesting right atrial pressure of 3 mmHg. IAS/Shunts: No atrial level shunt detected by color flow Doppler.  LEFT VENTRICLE PLAX 2D LVIDd:         5.00 cm      Diastology LVIDs:         4.60 cm      LV e' medial:    4.35 cm/s LV PW:         1.50 cm      LV E/e' medial:  22.4 LV IVS:        1.10 cm      LV e' lateral:   6.53 cm/s LVOT diam:     2.10 cm      LV E/e' lateral: 14.9 LV SV:         44 LV SV Index:   21 LVOT Area:     3.46 cm  LV Volumes (MOD) LV vol d, MOD A2C: 190.0 ml LV vol d, MOD A4C: 213.0 ml LV vol s, MOD A2C: 144.0  ml LV vol s, MOD A4C: 165.0 ml LV SV MOD A2C:     46.0 ml LV SV MOD A4C:     213.0 ml LV SV MOD BP:      46.4 ml RIGHT VENTRICLE RV Basal diam:  4.30 cm TAPSE (M-mode): 4.2 cm LEFT ATRIUM              Index        RIGHT ATRIUM           Index LA diam:        5.10 cm  2.40 cm/m   RA Area:     23.90 cm LA Vol (A2C):   91.9 ml  43.28 ml/m  RA Volume:   67.40 ml  31.75 ml/m LA Vol (A4C):   146.0 ml 68.77 ml/m LA Biplane Vol: 116.0 ml 54.64 ml/m  AORTIC VALVE                    PULMONIC VALVE AV Area (Vmax):    2.17 cm     PV Vmax:        0.68 m/s AV Area (Vmean):   2.27 cm     PV Vmean:       42.100 cm/s AV Area (VTI):     2.50 cm     PV VTI:         0.088 m AV Vmax:           100.00 cm/s  PV Peak grad:   1.8 mmHg AV Vmean:          76.200 cm/s  PV Mean grad:   1.0 mmHg AV VTI:            0.176 m      RVOT Peak grad: 4 mmHg AV Peak Grad:      4.0 mmHg AV Mean Grad:      3.0 mmHg LVOT Vmax:         62.70 cm/s LVOT Vmean:        50.000 cm/s LVOT VTI:          0.127 m LVOT/AV VTI ratio: 0.72  AORTA Ao Root diam: 3.80 cm MITRAL VALVE               TRICUSPID VALVE MV Area (PHT): 5.75 cm    TR Peak grad:   41.5 mmHg MV Decel Time: 132 msec    TR Vmax:        322.00 cm/s MV E velocity: 97.30 cm/s MV A velocity: 35.60 cm/s  SHUNTS MV E/A ratio:  2.73        Systemic VTI:  0.13 m                            Systemic Diam: 2.10  cm                            Pulmonic VTI:  0.116 m Kathlyn Sacramento MD Electronically signed by Kathlyn Sacramento MD Signature Date/Time: 12/18/2021/11:51:30 AM    Final    US LIVER DOPPLER  Result Date: 12/26/2021 CLINICAL DATA:  61 year old male with concern for portal thrombus. EXAM: DUPLEX ULTRASOUND OF LIVER TECHNIQUE: Color and duplex Doppler ultrasound was performed to evaluate the hepatic in-flow and out-flow vessels. COMPARISON:  CT abdomen pelvis from earlier the same day FINDINGS: Liver: Normal parenchymal echogenicity. Normal hepatic contour without nodularity. No focal  lesion, mass or intrahepatic biliary ductal dilatation. Main Portal Vein size: 1.9 cm Heterogeneously hypoechoic nonocclusive thrombus is visualized throughout the main portal vein extending into the left portal vein where it is occlusive and mildly expansile. There is shadowing within the main portal vein compatible with portal venous gas visualized on comparison CT. Portal Vein Velocities Main Prox:  20 cm/sec, antegrade Main Mid: 19 cm/sec, antegrade Main Dist: Not measured. Right: 45 cm/sec, antegrade Left: 0 cm/sec Hepatic Vein Velocities Right:  26 cm/sec Middle:  20 cm/sec Left:  30 cm/sec IVC: Present and patent with normal respiratory phasicity. 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 The thrombus is occlusive in the left portal vein, nonocclusive in the main portal vein. Gas is visualized in the main portal vein. Ruthann Cancer, MD [facility] Electronically Signed   By: Ruthann Cancer M.D.   On:  12/26/2021 07:57   [place] EKG SITE RITE  Result Date: 01/10/2022 If Site Rite image not attached, placement could not be confirmed due to current cardiac rhythm.  [place] EKG SITE RITE  Result Date: 01/03/2022 If Site Rite image not attached, placement could not be confirmed due to current cardiac rhythm.      Discharge Exam: Vitals:   01/13/22 0458 01/13/22 0819  BP: 100/71 107/71  Pulse: 72 68  Resp: 20 14  Temp: 98.7 F (37.1 C) 98.3 F (36.8 C)  SpO2: 98% 99%    General: Pt is alert, awake, not in acute distress Cardiovascular: RRR, S1/S2 +, trace edema Respiratory: CTA bilaterally, no wheezing, no rhonchi, no respiratory distress, no conversational dyspnea  Abdominal: Soft, NT, ND, bowel sounds + Extremities: trace edema, no cyanosis Psych: Normal mood and affect, poor judgement and insight     The results of significant diagnostics from this hospitalization (including imaging, microbiology, ancillary and laboratory) are listed below for reference.     Microbiology: Recent Results (from the past 240 hour(s))  Culture, blood (Routine X 2) w Reflex to ID Panel     Status: Abnormal   Collection Time: 01/05/22  6:05 AM   Specimen: BLOOD  Result Value Ref Range Status   Specimen Description   Final    BLOOD BLOOD RIGHT HAND Performed at [facility], [address] [place], [place] [address]    Special Requests   Final    BOTTLES DRAWN AEROBIC AND ANAEROBIC Blood Culture adequate volume Performed at [facility], [address] [place], [place] [address]    Culture  Setup Time   Final    Organism ID to follow [place] COCCI IN PAIRS IN BOTH AEROBIC AND ANAEROBIC BOTTLES CRITICAL RESULT CALLED TO, READ BACK BY AND VERIFIED WITH: ABBY ELLINGTON ON 01/05/22 AT 1736 QSD CRITICAL RESULT CALLED TO, READ BACK BY AND VERIFIED WITH: PHARM D L.KLUTZ ON [id number] AT 0948 BY E.PARRISH    Culture (A)  Final    ESCHERICHIA  COLI STREPTOCOCCUS ANGINOSIS THE SIGNIFICANCE OF ISOLATING THIS ORGANISM FROM A SINGLE SET OF BLOOD CULTURES WHEN MULTIPLE SETS ARE DRAWN IS UNCERTAIN. PLEASE NOTIFY THE MICROBIOLOGY DEPARTMENT WITHIN ONE  WEEK IF SPECIATION AND SENSITIVITIES ARE REQUIRED. Performed at [facility], [place] [address] [place], [place] [address]    Report Status 01/10/2022 FINAL  Final   Organism ID, Bacteria ESCHERICHIA COLI  Final      Susceptibility   Escherichia coli - MIC*    AMPICILLIN <=2 SENSITIVE Sensitive     CEFAZOLIN <=4 SENSITIVE Sensitive     CEFEPIME <=0.12 SENSITIVE Sensitive     CEFTAZIDIME <=1 SENSITIVE Sensitive     CEFTRIAXONE <=0.25 SENSITIVE Sensitive     CIPROFLOXACIN <=0.25 SENSITIVE Sensitive     GENTAMICIN <=1 SENSITIVE Sensitive     IMIPENEM <=0.25 SENSITIVE Sensitive     TRIMETH/SULFA <=20 SENSITIVE Sensitive     AMPICILLIN/SULBACTAM <=2 SENSITIVE Sensitive     PIP/TAZO <=4 SENSITIVE Sensitive     * ESCHERICHIA COLI  Blood Culture ID Panel (Reflexed)     Status: Abnormal   Collection Time: 01/05/22  6:05 AM  Result Value Ref Range Status   Enterococcus faecalis NOT DETECTED NOT DETECTED Final   Enterococcus Faecium NOT DETECTED NOT DETECTED Final   Listeria monocytogenes NOT DETECTED NOT DETECTED Final   Staphylococcus species NOT DETECTED NOT DETECTED Final   Staphylococcus aureus (BCID) NOT DETECTED NOT DETECTED Final   Staphylococcus epidermidis NOT DETECTED NOT DETECTED Final   Staphylococcus lugdunensis NOT DETECTED NOT DETECTED Final   Streptococcus species DETECTED (A) NOT DETECTED Final    Comment: Not Enterococcus species, Streptococcus agalactiae, Streptococcus pyogenes, or Streptococcus pneumoniae. CRITICAL RESULT CALLED TO, READ BACK BY AND VERIFIED WITH: ABBY ELLINGTON ON 01/05/22 AT 1736 QSD    Streptococcus agalactiae NOT DETECTED NOT DETECTED Final   Streptococcus pneumoniae NOT DETECTED NOT DETECTED Final   Streptococcus pyogenes NOT DETECTED NOT  DETECTED Final   A.calcoaceticus-baumannii NOT DETECTED NOT DETECTED Final   Bacteroides fragilis NOT DETECTED NOT DETECTED Final   Enterobacterales DETECTED (A) NOT DETECTED Final    Comment: Enterobacterales represent a large order of gram negative bacteria, not a single organism. CRITICAL RESULT CALLED TO, READ BACK BY AND VERIFIED WITH: ABBY ELLINGTON ON 01/05/22 AT 1736 QSD    Enterobacter cloacae complex NOT DETECTED NOT DETECTED Final   Escherichia coli DETECTED (A) NOT DETECTED Final    Comment: CRITICAL RESULT CALLED TO, READ BACK BY AND VERIFIED WITH: ABBY ELLINGTON ON 01/05/22 AT 1736 QSD    Klebsiella aerogenes NOT DETECTED NOT DETECTED Final   Klebsiella oxytoca NOT DETECTED NOT DETECTED Final   Klebsiella pneumoniae NOT DETECTED NOT DETECTED Final   Proteus species NOT DETECTED NOT DETECTED Final   Salmonella species NOT DETECTED NOT DETECTED Final   Serratia marcescens NOT DETECTED NOT DETECTED Final   Haemophilus influenzae NOT DETECTED NOT DETECTED Final   Neisseria meningitidis NOT DETECTED NOT DETECTED Final   Pseudomonas aeruginosa NOT DETECTED NOT DETECTED Final   Stenotrophomonas maltophilia NOT DETECTED NOT DETECTED Final   Candida albicans NOT DETECTED NOT DETECTED Final   Candida auris NOT DETECTED NOT DETECTED Final   Candida glabrata NOT DETECTED NOT DETECTED Final   Candida krusei NOT DETECTED NOT DETECTED Final   Candida parapsilosis NOT DETECTED NOT DETECTED Final   Candida tropicalis NOT DETECTED NOT DETECTED Final   Cryptococcus neoformans/gattii NOT DETECTED NOT DETECTED Final   CTX-M ESBL NOT DETECTED NOT DETECTED Final   Carbapenem resistance IMP NOT DETECTED NOT DETECTED Final   Carbapenem resistance KPC NOT DETECTED NOT DETECTED Final   Carbapenem resistance NDM NOT DETECTED NOT DETECTED Final   Carbapenem resist  OXA 48 LIKE NOT DETECTED NOT DETECTED Final   Carbapenem resistance VIM NOT DETECTED NOT DETECTED Final    Comment: Performed at [facility], [place], [place] [address]  SARS CORONAVIRUS 2 (TAT 6-24 HRS) Nasopharyngeal Nasopharyngeal Swab     Status: None   Collection Time: 01/07/22  1:10 AM   Specimen: Nasopharyngeal Swab  Result Value Ref Range Status   SARS Coronavirus 2 NEGATIVE NEGATIVE Final    Comment: (NOTE) SARS-CoV-2 target nucleic acids are NOT DETECTED.  The SARS-CoV-2 RNA is generally detectable in upper and lower respiratory specimens during the acute phase of infection. Negative results do not preclude SARS-CoV-2 infection, do not rule out co-infections with other pathogens, and should not be used as the sole basis for treatment or other patient management decisions. Negative results must be combined with clinical observations, patient history, and epidemiological information. The expected result is Negative.  Fact Sheet for Patients: SugarRoll.be  Fact Sheet for Healthcare Providers: https://www.woods-mathews.com/  This test is not yet approved or cleared by the [place] FDA and  has been authorized for detection and/or diagnosis of SARS-CoV-2 by FDA under an Emergency Use Authorization (EUA). This EUA will remain  in effect (meaning this test can be used) for the duration of the COVID-19 declaration under Se ction 564(b)(1) of the Act, 21 U.S.C. section 360bbb-3(b)(1), unless the authorization is terminated or revoked sooner.  Performed at [facility], [place] [address] [place], [place] [address]   CULTURE, BLOOD (ROUTINE X 2) w Reflex to ID Panel     Status: None (Preliminary result)   Collection Time: 01/09/22 11:56 AM   Specimen: BLOOD  Result Value Ref Range Status   Specimen Description BLOOD BLOOD RIGHT HAND  Final   Special Requests   Final    BOTTLES DRAWN AEROBIC AND ANAEROBIC Blood Culture results may not be optimal due to an excessive volume of blood received in culture bottles   Culture   Final    NO  GROWTH 4 DAYS Performed at [facility], [address] [place], [place] [address]    Report Status PENDING  Incomplete  CULTURE, BLOOD (ROUTINE X 2) w Reflex to ID Panel     Status: None (Preliminary result)   Collection Time: 01/09/22 12:03 PM   Specimen: BLOOD  Result Value Ref Range Status   Specimen Description BLOOD BLOOD LEFT HAND  Final   Special Requests   Final    BOTTLES DRAWN AEROBIC AND ANAEROBIC Blood Culture adequate volume   Culture   Final    NO GROWTH 4 DAYS Performed at [facility], [address] [place], [place] [address]    Report Status PENDING  Incomplete     Labs: BNP (last 3 results) Recent Labs    12/25/21 2244 01/04/22 0550 01/11/22 0542  BNP 232.7* 450.2* 275.1*   Basic Metabolic Panel: Recent Labs  Lab 01/07/22 0542 01/08/22 0605 01/10/22 0606 01/11/22 0543 01/12/22 0409 01/13/22 0530  NA 134* 134* 134* 135 133* 132*  K 4.0 3.7 4.0 4.0 3.6 3.9  CL 105 104 104 103 105 102  CO2 24 23 23 24 23 22   GLUCOSE 114* 89 91 99 122* 97  BUN 13 15 8 9 9 9   CREATININE 1.33* 1.51* 1.20 1.21 1.10 1.20  CALCIUM 7.7* 7.6* 8.0* 8.2* 8.2* 8.4*  MG 2.0 1.9  --   --   --   --   PHOS  --  3.7  --   --   --   --    Liver Function Tests: Recent Labs  Lab 01/07/22 0542  AST 23  ALT 29  ALKPHOS 156*  BILITOT 2.4*  PROT 6.3*  ALBUMIN 1.7*   No results for input(s): LIPASE, AMYLASE in the last 168 hours. No results for input(s): AMMONIA in the last 168 hours. CBC: Recent Labs  Lab 01/09/22 0427 01/10/22 0606 01/11/22 0543 01/12/22 0409 01/13/22 0530  WBC 15.6* 11.5* 11.7* 8.5 6.3  HGB 7.9* 8.0* 7.6* 7.4* 7.7*  HCT 23.0* 23.8* 23.1* 21.9* 23.2*  MCV 72.1* 73.2* 74.8* 75.3* 76.8*  PLT 329 349 376 369 422*   Cardiac Enzymes: No results for input(s): CKTOTAL, CKMB, CKMBINDEX, TROPONINI in the last 168 hours. BNP: Invalid input(s): POCBNP CBG: No results for input(s): GLUCAP in the last 168 hours. D-Dimer No results  for input(s): DDIMER in the last 72 hours. Hgb A1c No results for input(s): HGBA1C in the last 72 hours. Lipid Profile No results for input(s): CHOL, HDL, LDLCALC, TRIG, CHOLHDL, LDLDIRECT in the last 72 hours. Thyroid function studies No results for input(s): TSH, T4TOTAL, T3FREE, THYROIDAB in the last 72 hours.  Invalid input(s): FREET3 Anemia work up Recent Labs    01/11/22 0543 01/12/22 0409  FERRITIN  --  514*  TIBC  --  214*  IRON  --  24*  RETICCTPCT 2.1  --    Urinalysis    Component Value Date/Time   COLORURINE AMBER (A) 01/04/2022 0820   APPEARANCEUR CLEAR (A) 01/04/2022 0820   LABSPEC 1.012 01/04/2022 0820   PHURINE 5.0 01/04/2022 0820   GLUCOSEU NEGATIVE 01/04/2022 0820   HGBUR NEGATIVE 01/04/2022 0820   BILIRUBINUR NEGATIVE 01/04/2022 0820   KETONESUR NEGATIVE 01/04/2022 0820   PROTEINUR NEGATIVE 01/04/2022 0820   NITRITE NEGATIVE 01/04/2022 0820   LEUKOCYTESUR NEGATIVE 01/04/2022 0820   Sepsis Labs Invalid input(s): PROCALCITONIN,  WBC,  LACTICIDVEN Microbiology Recent Results (from the past 240 hour(s))  Culture, blood (Routine X 2) w Reflex to ID Panel     Status: Abnormal   Collection Time: 01/05/22  6:05 AM   Specimen: BLOOD  Result Value Ref Range Status   Specimen Description   Final    BLOOD BLOOD RIGHT HAND Performed at [facility], [address], [place], [place] [address]    Special Requests   Final    BOTTLES DRAWN AEROBIC AND ANAEROBIC Blood Culture adequate volume Performed at [facility], [address] [place], [place] [address]    Culture  Setup Time   Final    Organism ID to follow [place] COCCI IN PAIRS IN BOTH AEROBIC AND ANAEROBIC BOTTLES CRITICAL RESULT CALLED TO, READ BACK BY AND VERIFIED WITH: ABBY ELLINGTON ON 01/05/22 AT 1736 QSD CRITICAL RESULT CALLED TO, READ BACK BY AND VERIFIED WITH: PHARM D L.KLUTZ ON [id number] AT 0948 BY E.PARRISH    Culture (A)  Final    ESCHERICHIA  COLI STREPTOCOCCUS ANGINOSIS THE SIGNIFICANCE OF ISOLATING THIS ORGANISM FROM A SINGLE SET OF BLOOD CULTURES WHEN MULTIPLE SETS ARE DRAWN IS UNCERTAIN. PLEASE NOTIFY THE MICROBIOLOGY DEPARTMENT WITHIN ONE WEEK IF SPECIATION AND SENSITIVITIES ARE REQUIRED. Performed at [facility], [place] [address] [place], [place] [address]    Report Status 01/10/2022 FINAL  Final   Organism ID, Bacteria ESCHERICHIA COLI  Final      Susceptibility   Escherichia coli - MIC*    AMPICILLIN <=2 SENSITIVE Sensitive  CEFAZOLIN <=4 SENSITIVE Sensitive     CEFEPIME <=0.12 SENSITIVE Sensitive     CEFTAZIDIME <=1 SENSITIVE Sensitive     CEFTRIAXONE <=0.25 SENSITIVE Sensitive     CIPROFLOXACIN <=0.25 SENSITIVE Sensitive     GENTAMICIN <=1 SENSITIVE Sensitive     IMIPENEM <=0.25 SENSITIVE Sensitive     TRIMETH/SULFA <=20 SENSITIVE Sensitive     AMPICILLIN/SULBACTAM <=2 SENSITIVE Sensitive     PIP/TAZO <=4 SENSITIVE Sensitive     * ESCHERICHIA COLI  Blood Culture ID Panel (Reflexed)     Status: Abnormal   Collection Time: 01/05/22  6:05 AM  Result Value Ref Range Status   Enterococcus faecalis NOT DETECTED NOT DETECTED Final   Enterococcus Faecium NOT DETECTED NOT DETECTED Final   Listeria monocytogenes NOT DETECTED NOT DETECTED Final   Staphylococcus species NOT DETECTED NOT DETECTED Final   Staphylococcus aureus (BCID) NOT DETECTED NOT DETECTED Final   Staphylococcus epidermidis NOT DETECTED NOT DETECTED Final   Staphylococcus lugdunensis NOT DETECTED NOT DETECTED Final   Streptococcus species DETECTED (A) NOT DETECTED Final    Comment: Not Enterococcus species, Streptococcus agalactiae, Streptococcus pyogenes, or Streptococcus pneumoniae. CRITICAL RESULT CALLED TO, READ BACK BY AND VERIFIED WITH: ABBY ELLINGTON ON 01/05/22 AT 1736 QSD    Streptococcus agalactiae NOT DETECTED NOT DETECTED Final   Streptococcus pneumoniae NOT DETECTED NOT DETECTED Final   Streptococcus pyogenes NOT DETECTED NOT  DETECTED Final   A.calcoaceticus-baumannii NOT DETECTED NOT DETECTED Final   Bacteroides fragilis NOT DETECTED NOT DETECTED Final   Enterobacterales DETECTED (A) NOT DETECTED Final    Comment: Enterobacterales represent a large order of gram negative bacteria, not a single organism. CRITICAL RESULT CALLED TO, READ BACK BY AND VERIFIED WITH: ABBY ELLINGTON ON 01/05/22 AT 1736 QSD    Enterobacter cloacae complex NOT DETECTED NOT DETECTED Final   Escherichia coli DETECTED (A) NOT DETECTED Final    Comment: CRITICAL RESULT CALLED TO, READ BACK BY AND VERIFIED WITH: ABBY ELLINGTON ON 01/05/22 AT 1736 QSD    Klebsiella aerogenes NOT DETECTED NOT DETECTED Final   Klebsiella oxytoca NOT DETECTED NOT DETECTED Final   Klebsiella pneumoniae NOT DETECTED NOT DETECTED Final   Proteus species NOT DETECTED NOT DETECTED Final   Salmonella species NOT DETECTED NOT DETECTED Final   Serratia marcescens NOT DETECTED NOT DETECTED Final   Haemophilus influenzae NOT DETECTED NOT DETECTED Final   Neisseria meningitidis NOT DETECTED NOT DETECTED Final   Pseudomonas aeruginosa NOT DETECTED NOT DETECTED Final   Stenotrophomonas maltophilia NOT DETECTED NOT DETECTED Final   Candida albicans NOT DETECTED NOT DETECTED Final   Candida auris NOT DETECTED NOT DETECTED Final   Candida glabrata NOT DETECTED NOT DETECTED Final   Candida krusei NOT DETECTED NOT DETECTED Final   Candida parapsilosis NOT DETECTED NOT DETECTED Final   Candida tropicalis NOT DETECTED NOT DETECTED Final   Cryptococcus neoformans/gattii NOT DETECTED NOT DETECTED Final   CTX-M ESBL NOT DETECTED NOT DETECTED Final   Carbapenem resistance IMP NOT DETECTED NOT DETECTED Final   Carbapenem resistance KPC NOT DETECTED NOT DETECTED Final   Carbapenem resistance NDM NOT DETECTED NOT DETECTED Final   Carbapenem resist OXA 48 LIKE NOT DETECTED NOT DETECTED Final   Carbapenem resistance VIM NOT DETECTED NOT DETECTED Final    Comment: Performed at [facility] [facility], [address] [place], [place] [address]  SARS CORONAVIRUS 2 (TAT 6-24 HRS) Nasopharyngeal Nasopharyngeal Swab     Status: None   Collection Time: 01/07/22  1:10 AM  Specimen: Nasopharyngeal Swab  Result Value Ref Range Status   SARS Coronavirus 2 NEGATIVE NEGATIVE Final    Comment: (NOTE) SARS-CoV-2 target nucleic acids are NOT DETECTED.  The SARS-CoV-2 RNA is generally detectable in upper and lower respiratory specimens during the acute phase of infection. Negative results do not preclude SARS-CoV-2 infection, do not rule out co-infections with other pathogens, and should not be used as the sole basis for treatment or other patient management decisions. Negative results must be combined with clinical observations, patient history, and epidemiological information. The expected result is Negative.  Fact Sheet for Patients: SugarRoll.be  Fact Sheet for Healthcare Providers: https://www.woods-mathews.com/  This test is not yet approved or cleared by the [place] FDA and  has been authorized for detection and/or diagnosis of SARS-CoV-2 by FDA under an Emergency Use Authorization (EUA). This EUA will remain  in effect (meaning this test can be used) for the duration of the COVID-19 declaration under Se ction 564(b)(1) of the Act, 21 U.S.C. section 360bbb-3(b)(1), unless the authorization is terminated or revoked sooner.  Performed at [facility], [place] [address] [place], [place] [address]   CULTURE, BLOOD (ROUTINE X 2) w Reflex to ID Panel     Status: None (Preliminary result)   Collection Time: 01/09/22 11:56 AM   Specimen: BLOOD  Result Value Ref Range Status   Specimen Description BLOOD BLOOD RIGHT HAND  Final   Special Requests   Final    BOTTLES DRAWN AEROBIC AND ANAEROBIC Blood Culture results may not be optimal due to an excessive volume of blood received in culture bottles   Culture   Final    NO  GROWTH 4 DAYS Performed at [facility], [address], [place], [place] [address]    Report Status PENDING  Incomplete  CULTURE, BLOOD (ROUTINE X 2) w Reflex to ID Panel     Status: None (Preliminary result)   Collection Time: 01/09/22 12:03 PM   Specimen: BLOOD  Result Value Ref Range Status   Specimen Description BLOOD BLOOD LEFT HAND  Final   Special Requests   Final    BOTTLES DRAWN AEROBIC AND ANAEROBIC Blood Culture adequate volume   Culture   Final    NO GROWTH 4 DAYS Performed at [facility], [address] [place], [place] [address]    Report Status PENDING  Incomplete     Patient was seen and examined on the day of discharge and was found to be in stable condition. Time coordinating discharge: 25 minutes including assessment and coordination of care, as well as examination of the patient.   SIGNED:  Dessa Phi, DO [facility] 01/13/2022, 9:36 AM

## 2022-01-13 NOTE — TOC Transition Note (Addendum)
Transition of Care [facility]) - CM/SW Discharge Note   Patient Details  Name: Steven Pham MRN: [id number] Date of Birth: 06-Mar-1961  Transition of [facility]) CM/SW Contact:  Gildardo Griffes, LCSW Phone Number: 01/13/2022, 10:34 AM   Clinical Narrative:     CSW notes patient to discharge today with iv antibiotics, per Jeri Modena with home infusion Anastasia Fiedler will be out tomorrow 1/16 to assist patient with iv antibiotics. Per Pam patient cannot self infuse so she is working on confirming a caregiver at home that can assist, hopeful that patient's friend Annette Stable is able to help. She is calling to follow up today.   Other than [facility] for RN home infusion, Pam reports she believes floor CSW Clarisse Gouge was unable to identify a home health agency for additional support for patient due to his [employer].   Update: Per Jeri Modena, she spoke with patient's friend Melodie Bouillon who has agreed to help patient, is very engaged and supportive. Video link has been emailed and sent to his cell phone for teaching, Anastasia Fiedler has been updated. RN aware to ensure patient gets his dose today before he leaves then [facility] will deliver for start tomorrow.   Update 2 4:30 pm: Per RN Bill called the floor and after reviewing video on teaching reports he does not feel comfortable helping patient. Patient's phone was charged and he has reached out to multiple people, none are offering to assist him at this time. Patient is not safe to discharge without a confirmed identified support to aid in home infusion as patient is unable to self infuse. Jeri Modena reports she will follow up with Bill tomorrow to see if he is able to meet Pam at hospital and she can do teaching in person to see if he feels more comfortable and becomes agreeable to assisting patient at home, therefore discharge held for today. Treatment team including MD and RN are aware. CSW has also relayed delay in discharge today to [facility] supervisor.   Final next level  of care: Home w Home Health Services Barriers to Discharge: No Barriers Identified   Patient Goals and CMS Choice Patient states their goals for this hospitalization and ongoing recovery are:: to go home CMS Medicare.gov Compare Post Acute Care list provided to:: Patient Choice offered to / list presented to : Patient  Discharge Placement                    Patient and family notified of of transfer: 01/13/22  Discharge Plan and Services                                     Social Determinants of Health (SDOH) Interventions     Readmission Risk Interventions No flowsheet data found.

## 2022-01-14 LAB — CULTURE, BLOOD (ROUTINE X 2)
Culture: NO GROWTH
Culture: NO GROWTH
Special Requests: ADEQUATE

## 2022-01-14 MED ORDER — SODIUM CHLORIDE 0.9 % IV SOLN: 1.0000 g | INTRAVENOUS | Status: DC

## 2022-01-14 NOTE — TOC Transition Note (Addendum)
Transition of Care [facility]) - CM/SW Discharge Note   Patient Details  Name: Dio Giller MRN: [id number] Date of Birth: February 17, 1961  Transition of Care [facility]) CM/SW Contact:  [place] Cellar, RN Phone Number: 01/14/2022, 4:20 PM   Clinical Narrative:    Sherron Monday with Bill who confirmed he would drive patient to same day surgery for infusions every day. Requested appointments be after 1pm so he can take care of his appointments in the morning. Annette Stable was very appreciative of this process and states he will be here to pick up patient in 1 hour. Reviewed with Annette Stable where to go for same day surgery and timeframe for infusions. Answered all questions.     Final next level of care: Home w Home Health Services Barriers to Discharge: No Barriers Identified   Patient Goals and CMS Choice Patient states their goals for this hospitalization and ongoing recovery are:: to go home CMS Medicare.gov Compare Post Acute Care list provided to:: Patient Choice offered to / list presented to : Patient  Discharge Placement                    Patient and family notified of of transfer: 01/13/22  Discharge Plan and Services                                     Social Determinants of Health (SDOH) Interventions     Readmission Risk Interventions No flowsheet data found.

## 2022-01-14 NOTE — TOC Progression Note (Addendum)
Transition of Care [facility]) - Progression Note    Patient Details  Name: Steven Pham MRN: [id number] Date of Birth: 1961/06/29  Transition of Care [facility]) CM/SW Contact  Gildardo Griffes, [place] Phone Number: 01/14/2022, 2:35 PM  Clinical Narrative:     Update: Patient will go to outpatient iv infusion at [facility] mall entrance.  he will need to go to 2nd floor desk where same day surgery is located. Patient's friend Steven Pham has been communicated the above and has confirmed with [facility] supervisor he will be aiding in transportation patient to the daily infusions. Pam with home infusion updated. No further discharge needs identified. CSW signing off.        Per Pam with Home Infusion she reports that teaching with patient's friend Steven Pham to do the home Iv antibiotics was not productive today in which Steven Pham does not feel he can do this. Pam is going to continue to work on identifying home support as patient is unable to self infuse.   CSW has updated TOC supervisor as well as ID on above barriers to discharge home with iv antibiotics.      Barriers to Discharge: No Barriers Identified  Expected Discharge Plan and Services           Expected Discharge Date: 01/13/22                                     Social Determinants of Health (SDOH) Interventions    Readmission Risk Interventions No flowsheet data found.

## 2022-01-14 NOTE — Progress Notes (Signed)
°  PROGRESS NOTE  Discharge delayed yesterday as we could not confirm someone to help him with home IV antibiotic infusion.  Patient is seen today.  He is medically stable for discharge once home health issues are resolved.  Patient asking if he is able to secure help and pay out-of-pocket to have someone come into the house daily to help with his infusions.    Noralee Stain, DO [facility] 01/14/2022, 10:01 AM  Available via Epic secure chat 7am-7pm After these hours, please refer to coverage provider listed on [email or web address]

## 2022-01-15 ENCOUNTER — Other Ambulatory Visit: Payer: Self-pay

## 2022-01-15 ENCOUNTER — Ambulatory Visit
Admit: 2022-01-15 | Discharge: 2022-01-15 | Disposition: A | Discharge status: Home / Self Care | Payer: BC Managed Care – PPO | Attending: Infectious Diseases | Admitting: Infectious Diseases

## 2022-01-15 DIAGNOSIS — B955 Unspecified streptococcus as the cause of diseases classified elsewhere: Secondary | ICD-10-CM | POA: Diagnosis not present

## 2022-01-15 DIAGNOSIS — K751 Phlebitis of portal vein: Secondary | ICD-10-CM | POA: Insufficient documentation

## 2022-01-15 DIAGNOSIS — A498 Other bacterial infections of unspecified site: Secondary | ICD-10-CM | POA: Diagnosis not present

## 2022-01-15 DIAGNOSIS — R7881 Bacteremia: Secondary | ICD-10-CM | POA: Diagnosis not present

## 2022-01-15 DIAGNOSIS — I81 Portal vein thrombosis: Secondary | ICD-10-CM | POA: Insufficient documentation

## 2022-01-15 MED ORDER — HEPARIN SOD (PORK) LOCK FLUSH 100 UNIT/ML IV SOLN: 250.0000 [IU] | INTRAVENOUS | Status: AC | PRN

## 2022-01-15 MED ORDER — SODIUM CHLORIDE 0.9 % IV SOLN
1.0000 g | INTRAVENOUS | Status: DC
  Administered 2022-01-15: 1000 mg via INTRAVENOUS
  Filled 2022-01-15 (×2): qty 1

## 2022-01-15 MED ORDER — SODIUM CHLORIDE FLUSH 0.9 % IV SOLN
INTRAVENOUS | Status: AC
  Administered 2022-01-15: 10 mL
  Filled 2022-01-15: qty 10

## 2022-01-15 MED ORDER — HEPARIN SOD (PORK) LOCK FLUSH 100 UNIT/ML IV SOLN
INTRAVENOUS | Status: AC
  Administered 2022-01-15: 250 [IU]
  Filled 2022-01-15: qty 5

## 2022-01-16 ENCOUNTER — Ambulatory Visit
Admission: RE | Admit: 2022-01-16 | Discharge: 2022-01-16 | Disposition: A | Discharge status: Home / Self Care | Payer: BC Managed Care – PPO | Source: Ambulatory Visit | Attending: Infectious Diseases | Admitting: Infectious Diseases

## 2022-01-16 DIAGNOSIS — K751 Phlebitis of portal vein: Secondary | ICD-10-CM | POA: Diagnosis not present

## 2022-01-16 DIAGNOSIS — B998 Other infectious disease: Secondary | ICD-10-CM | POA: Insufficient documentation

## 2022-01-16 DIAGNOSIS — I81 Portal vein thrombosis: Secondary | ICD-10-CM | POA: Diagnosis not present

## 2022-01-16 DIAGNOSIS — R7881 Bacteremia: Secondary | ICD-10-CM | POA: Insufficient documentation

## 2022-01-16 DIAGNOSIS — K5712 Diverticulitis of small intestine without perforation or abscess without bleeding: Secondary | ICD-10-CM | POA: Diagnosis not present

## 2022-01-16 MED ORDER — PROPOFOL 10 MG/ML IV BOLUS
INTRAVENOUS | Status: AC
  Filled 2022-01-16: qty 20

## 2022-01-16 MED ORDER — SODIUM CHLORIDE 0.9 % IV SOLN
1.0000 g | Freq: Once | INTRAVENOUS | Status: AC
  Administered 2022-01-16: 1000 mg via INTRAVENOUS
  Filled 2022-01-16: qty 1

## 2022-01-17 ENCOUNTER — Ambulatory Visit
Admission: RE | Admit: 2022-01-17 | Discharge: 2022-01-17 | Disposition: A | Discharge status: Home / Self Care | Payer: BC Managed Care – PPO | Source: Ambulatory Visit | Attending: Infectious Diseases | Admitting: Infectious Diseases

## 2022-01-17 ENCOUNTER — Ambulatory Visit: Payer: BC Managed Care – PPO | Admitting: Surgery

## 2022-01-17 ENCOUNTER — Other Ambulatory Visit: Payer: Self-pay

## 2022-01-17 DIAGNOSIS — R7881 Bacteremia: Secondary | ICD-10-CM | POA: Insufficient documentation

## 2022-01-17 MED ORDER — HEPARIN SOD (PORK) LOCK FLUSH 100 UNIT/ML IV SOLN
250.0000 [IU] | INTRAVENOUS | Status: AC | PRN
  Administered 2022-01-17: 250 [IU]

## 2022-01-17 MED ORDER — SODIUM CHLORIDE 0.9 % IV SOLN
1.0000 g | INTRAVENOUS | Status: DC
  Administered 2022-01-17: 1000 mg via INTRAVENOUS
  Filled 2022-01-17 (×2): qty 1

## 2022-01-18 ENCOUNTER — Ambulatory Visit
Admission: RE | Admit: 2022-01-18 | Discharge: 2022-01-18 | Disposition: A | Discharge status: Home / Self Care | Payer: BC Managed Care – PPO | Source: Ambulatory Visit | Attending: Infectious Diseases | Admitting: Infectious Diseases

## 2022-01-18 ENCOUNTER — Ambulatory Visit: Payer: BC Managed Care – PPO | Admitting: Medical

## 2022-01-18 DIAGNOSIS — K751 Phlebitis of portal vein: Secondary | ICD-10-CM | POA: Insufficient documentation

## 2022-01-18 DIAGNOSIS — R7881 Bacteremia: Secondary | ICD-10-CM | POA: Insufficient documentation

## 2022-01-18 DIAGNOSIS — K5713 Diverticulitis of small intestine without perforation or abscess with bleeding: Secondary | ICD-10-CM | POA: Diagnosis not present

## 2022-01-18 DIAGNOSIS — I81 Portal vein thrombosis: Secondary | ICD-10-CM | POA: Diagnosis not present

## 2022-01-18 DIAGNOSIS — Q265 Anomalous portal venous connection: Secondary | ICD-10-CM | POA: Diagnosis not present

## 2022-01-18 MED ORDER — HEPARIN SOD (PORK) LOCK FLUSH 100 UNIT/ML IV SOLN
INTRAVENOUS | Status: AC
  Filled 2022-01-18: qty 5

## 2022-01-18 MED ORDER — SODIUM CHLORIDE 0.9 % IV SOLN
1.0000 g | Freq: Once | INTRAVENOUS | Status: AC
  Administered 2022-01-18: 1000 mg via INTRAVENOUS
  Filled 2022-01-18: qty 1

## 2022-01-19 ENCOUNTER — Ambulatory Visit
Admission: RE | Admit: 2022-01-19 | Discharge: 2022-01-19 | Disposition: A | Discharge status: Home / Self Care | Payer: BC Managed Care – PPO | Source: Ambulatory Visit | Attending: Infectious Diseases | Admitting: Infectious Diseases

## 2022-01-19 ENCOUNTER — Other Ambulatory Visit: Payer: Self-pay

## 2022-01-19 DIAGNOSIS — I81 Portal vein thrombosis: Secondary | ICD-10-CM | POA: Diagnosis not present

## 2022-01-19 DIAGNOSIS — K751 Phlebitis of portal vein: Secondary | ICD-10-CM | POA: Diagnosis not present

## 2022-01-19 DIAGNOSIS — R7881 Bacteremia: Secondary | ICD-10-CM | POA: Diagnosis not present

## 2022-01-19 DIAGNOSIS — K5713 Diverticulitis of small intestine without perforation or abscess with bleeding: Secondary | ICD-10-CM | POA: Diagnosis not present

## 2022-01-19 DIAGNOSIS — Q265 Anomalous portal venous connection: Secondary | ICD-10-CM | POA: Diagnosis not present

## 2022-01-19 MED ORDER — SODIUM CHLORIDE 0.9 % IV SOLN
1.0000 g | Freq: Once | INTRAVENOUS | Status: AC
  Administered 2022-01-19: 1000 mg via INTRAVENOUS
  Filled 2022-01-19: qty 1

## 2022-01-19 MED ORDER — SODIUM CHLORIDE 0.9 % IV SOLN
1.0000 g | Freq: Once | INTRAVENOUS | Status: DC
  Filled 2022-01-19: qty 1

## 2022-01-19 MED ORDER — HEPARIN SODIUM (PORCINE) 5000 UNIT/ML IJ SOLN
INTRAMUSCULAR | Status: AC
  Filled 2022-01-19: qty 1

## 2022-01-19 MED ORDER — HEPARIN SOD (PORK) LOCK FLUSH 100 UNIT/ML IV SOLN
INTRAVENOUS | Status: AC
  Filled 2022-01-19: qty 5

## 2022-01-19 NOTE — Progress Notes (Incomplete)
Pt presents for Invanz infusion. Name/meds verified.  Tolerated infusion well.  PICC line flused w/NS and followed w/250u Heparin. Pt will return tomorrow for follow up administration.

## 2022-01-20 ENCOUNTER — Ambulatory Visit
Admission: RE | Admit: 2022-01-20 | Discharge: 2022-01-20 | Disposition: A | Discharge status: Home / Self Care | Payer: BC Managed Care – PPO | Source: Ambulatory Visit | Attending: Infectious Diseases | Admitting: Infectious Diseases

## 2022-01-20 DIAGNOSIS — N179 Acute kidney failure, unspecified: Secondary | ICD-10-CM | POA: Insufficient documentation

## 2022-01-20 DIAGNOSIS — A419 Sepsis, unspecified organism: Secondary | ICD-10-CM | POA: Insufficient documentation

## 2022-01-20 DIAGNOSIS — R6520 Severe sepsis without septic shock: Secondary | ICD-10-CM | POA: Diagnosis not present

## 2022-01-20 MED ORDER — HEPARIN SOD (PORK) LOCK FLUSH 100 UNIT/ML IV SOLN
INTRAVENOUS | Status: AC
  Administered 2022-01-20: 250 [IU]
  Filled 2022-01-20: qty 5

## 2022-01-20 MED ORDER — HEPARIN SOD (PORK) LOCK FLUSH 10 UNIT/ML IV SOLN
10.0000 [IU] | Freq: Once | INTRAVENOUS | Status: DC
  Filled 2022-01-20: qty 1

## 2022-01-20 MED ORDER — SODIUM CHLORIDE 0.9% FLUSH
10.0000 mL | Freq: Once | INTRAVENOUS | Status: AC
  Administered 2022-01-20: 10 mL via INTRAVENOUS

## 2022-01-20 MED ORDER — SODIUM CHLORIDE 0.9 % IV SOLN
1.0000 g | Freq: Once | INTRAVENOUS | Status: AC
  Administered 2022-01-20: 1000 mg via INTRAVENOUS
  Filled 2022-01-20: qty 1

## 2022-01-21 ENCOUNTER — Ambulatory Visit
Admission: RE | Admit: 2022-01-21 | Discharge: 2022-01-21 | Disposition: A | Discharge status: Home / Self Care | Payer: BC Managed Care – PPO | Source: Ambulatory Visit | Attending: Infectious Diseases | Admitting: Infectious Diseases

## 2022-01-21 ENCOUNTER — Other Ambulatory Visit: Payer: Self-pay

## 2022-01-21 VITALS — BP 125/83 | HR 77 | Temp 97.5°F | Resp 18 | Ht 63.0 in | Wt 240.0 lb

## 2022-01-21 DIAGNOSIS — Q265 Anomalous portal venous connection: Secondary | ICD-10-CM | POA: Insufficient documentation

## 2022-01-21 DIAGNOSIS — R7881 Bacteremia: Secondary | ICD-10-CM | POA: Insufficient documentation

## 2022-01-21 DIAGNOSIS — K751 Phlebitis of portal vein: Secondary | ICD-10-CM | POA: Insufficient documentation

## 2022-01-21 DIAGNOSIS — I81 Portal vein thrombosis: Secondary | ICD-10-CM | POA: Insufficient documentation

## 2022-01-21 DIAGNOSIS — K5713 Diverticulitis of small intestine without perforation or abscess with bleeding: Secondary | ICD-10-CM | POA: Insufficient documentation

## 2022-01-21 LAB — CBC WITH DIFFERENTIAL/PLATELET
Abs Immature Granulocytes: 0.01 10*3/uL (ref 0.00–0.07)
Basophils Absolute: 0.1 10*3/uL (ref 0.0–0.1)
Basophils Relative: 1 %
Eosinophils Absolute: 0.3 10*3/uL (ref 0.0–0.5)
Eosinophils Relative: 6 %
HCT: 24.7 % — ABNORMAL LOW (ref 39.0–52.0)
Hemoglobin: 8 g/dL — ABNORMAL LOW (ref 13.0–17.0)
Immature Granulocytes: 0 %
Lymphocytes Relative: 18 %
Lymphs Abs: 1 10*3/uL (ref 0.7–4.0)
MCH: 24.4 pg — ABNORMAL LOW (ref 26.0–34.0)
MCHC: 32.4 g/dL (ref 30.0–36.0)
MCV: 75.3 fL — ABNORMAL LOW (ref 80.0–100.0)
Monocytes Absolute: 0.6 10*3/uL (ref 0.1–1.0)
Monocytes Relative: 11 %
Neutro Abs: 3.4 10*3/uL (ref 1.7–7.7)
Neutrophils Relative %: 64 %
Platelets: 481 10*3/uL — ABNORMAL HIGH (ref 150–400)
RBC: 3.28 MIL/uL — ABNORMAL LOW (ref 4.22–5.81)
RDW: 17.7 % — ABNORMAL HIGH (ref 11.5–15.5)
WBC: 5.4 10*3/uL (ref 4.0–10.5)
nRBC: 0 % (ref 0.0–0.2)

## 2022-01-21 LAB — COMPREHENSIVE METABOLIC PANEL
ALT: 17 U/L (ref 0–44)
AST: 18 U/L (ref 15–41)
Albumin: 2.3 g/dL — ABNORMAL LOW (ref 3.5–5.0)
Alkaline Phosphatase: 109 U/L (ref 38–126)
Anion gap: 7 (ref 5–15)
BUN: 6 mg/dL (ref 6–20)
CO2: 26 mmol/L (ref 22–32)
Calcium: 8.6 mg/dL — ABNORMAL LOW (ref 8.9–10.3)
Chloride: 104 mmol/L (ref 98–111)
Creatinine, Ser: 0.88 mg/dL (ref 0.61–1.24)
GFR, Estimated: >60 mL/min (ref >60)
Glucose, Bld: 86 mg/dL (ref 70–99)
Potassium: 3.2 mmol/L — ABNORMAL LOW (ref 3.5–5.1)
Sodium: 137 mmol/L (ref 135–145)
Total Bilirubin: 0.8 mg/dL (ref 0.3–1.2)
Total Protein: 7.9 g/dL (ref 6.5–8.1)

## 2022-01-21 MED ORDER — SODIUM CHLORIDE 0.9% FLUSH: 10.0000 mL | Freq: Once | INTRAVENOUS | Status: AC

## 2022-01-21 MED ORDER — SODIUM CHLORIDE 0.9 % IV SOLN
1.0000 g | Freq: Once | INTRAVENOUS | Status: AC
  Administered 2022-01-21: 1000 mg via INTRAVENOUS
  Filled 2022-01-21: qty 1

## 2022-01-21 MED ORDER — HEPARIN SOD (PORK) LOCK FLUSH 100 UNIT/ML IV SOLN: 250.0000 [IU] | Freq: Once | INTRAVENOUS | Status: AC

## 2022-01-21 MED ORDER — SODIUM CHLORIDE FLUSH 0.9 % IV SOLN
INTRAVENOUS | Status: AC
  Administered 2022-01-21: 10 mL via INTRAVENOUS
  Filled 2022-01-21: qty 20

## 2022-01-21 MED ORDER — HEPARIN SOD (PORK) LOCK FLUSH 100 UNIT/ML IV SOLN
INTRAVENOUS | Status: AC
  Administered 2022-01-21: 250 [IU] via INTRAVENOUS
  Filled 2022-01-21: qty 5

## 2022-01-22 ENCOUNTER — Ambulatory Visit: Payer: BC Managed Care – PPO | Admitting: Surgery

## 2022-01-22 ENCOUNTER — Ambulatory Visit
Admission: RE | Admit: 2022-01-22 | Discharge: 2022-01-22 | Disposition: A | Discharge status: Home / Self Care | Payer: BC Managed Care – PPO | Source: Ambulatory Visit | Attending: Infectious Diseases | Admitting: Infectious Diseases

## 2022-01-22 ENCOUNTER — Emergency Department
Admission: EM | Admit: 2022-01-22 | Discharge: 2022-01-22 | Disposition: A | Discharge status: Home / Self Care | Payer: BC Managed Care – PPO | Attending: Emergency Medicine | Admitting: Emergency Medicine

## 2022-01-22 ENCOUNTER — Other Ambulatory Visit: Payer: Self-pay

## 2022-01-22 DIAGNOSIS — M79642 Pain in left hand: Secondary | ICD-10-CM | POA: Diagnosis not present

## 2022-01-22 DIAGNOSIS — M130 Polyarthritis, unspecified: Secondary | ICD-10-CM | POA: Diagnosis not present

## 2022-01-22 DIAGNOSIS — I509 Heart failure, unspecified: Secondary | ICD-10-CM | POA: Diagnosis not present

## 2022-01-22 DIAGNOSIS — I110 Hypertensive heart disease with heart failure: Secondary | ICD-10-CM | POA: Diagnosis not present

## 2022-01-22 LAB — CBC WITH DIFFERENTIAL/PLATELET
Abs Immature Granulocytes: 0.01 10*3/uL (ref 0.00–0.07)
Basophils Absolute: 0.1 10*3/uL (ref 0.0–0.1)
Basophils Relative: 1 %
Eosinophils Absolute: 0.1 10*3/uL (ref 0.0–0.5)
Eosinophils Relative: 1 %
HCT: 25.4 % — ABNORMAL LOW (ref 39.0–52.0)
Hemoglobin: 8.3 g/dL — ABNORMAL LOW (ref 13.0–17.0)
Immature Granulocytes: 0 %
Lymphocytes Relative: 12 %
Lymphs Abs: 0.8 10*3/uL (ref 0.7–4.0)
MCH: 24.9 pg — ABNORMAL LOW (ref 26.0–34.0)
MCHC: 32.7 g/dL (ref 30.0–36.0)
MCV: 76 fL — ABNORMAL LOW (ref 80.0–100.0)
Monocytes Absolute: 0.8 10*3/uL (ref 0.1–1.0)
Monocytes Relative: 13 %
Neutro Abs: 4.9 10*3/uL (ref 1.7–7.7)
Neutrophils Relative %: 73 %
Platelets: 464 10*3/uL — ABNORMAL HIGH (ref 150–400)
RBC: 3.34 MIL/uL — ABNORMAL LOW (ref 4.22–5.81)
RDW: 17.6 % — ABNORMAL HIGH (ref 11.5–15.5)
WBC: 6.6 10*3/uL (ref 4.0–10.5)
nRBC: 0 % (ref 0.0–0.2)

## 2022-01-22 LAB — BASIC METABOLIC PANEL
Anion gap: 10 (ref 5–15)
BUN: 6 mg/dL (ref 6–20)
CO2: 26 mmol/L (ref 22–32)
Calcium: 8.7 mg/dL — ABNORMAL LOW (ref 8.9–10.3)
Chloride: 102 mmol/L (ref 98–111)
Creatinine, Ser: 0.94 mg/dL (ref 0.61–1.24)
GFR, Estimated: >60 mL/min (ref >60)
Glucose, Bld: 93 mg/dL (ref 70–99)
Potassium: 3.3 mmol/L — ABNORMAL LOW (ref 3.5–5.1)
Sodium: 138 mmol/L (ref 135–145)

## 2022-01-22 LAB — URIC ACID: Uric Acid, Serum: 5.3 mg/dL (ref 3.7–8.6)

## 2022-01-22 MED ORDER — COLCHICINE 0.6 MG PO TABS
0.6000 mg | ORAL_TABLET | ORAL | Status: AC
  Administered 2022-01-22: 12:00:00 0.6 mg via ORAL
  Filled 2022-01-22: qty 1

## 2022-01-22 MED ORDER — FAMOTIDINE 20 MG PO TABS: 20.0000 mg | ORAL_TABLET | Freq: Two times a day (BID) | ORAL | 0 refills | Status: DC

## 2022-01-22 MED ORDER — SODIUM CHLORIDE 0.9 % IV SOLN
1.0000 g | Freq: Once | INTRAVENOUS | Status: DC
  Filled 2022-01-22: qty 1

## 2022-01-22 MED ORDER — PREDNISONE 20 MG PO TABS: 60.0000 mg | ORAL_TABLET | Freq: Every day | ORAL | 0 refills | Status: AC

## 2022-01-22 MED ORDER — SODIUM CHLORIDE 0.9 % IV SOLN
1.0000 g | Freq: Once | INTRAVENOUS | Status: AC
  Administered 2022-01-22: 16:00:00 1000 mg via INTRAVENOUS
  Filled 2022-01-22: qty 1

## 2022-01-22 MED ORDER — KETOROLAC TROMETHAMINE 30 MG/ML IJ SOLN
15.0000 mg | INTRAMUSCULAR | Status: AC
  Administered 2022-01-22: 11:00:00 15 mg via INTRAVENOUS
  Filled 2022-01-22: qty 1

## 2022-01-22 MED ORDER — NAPROXEN 500 MG PO TABS: 500.0000 mg | ORAL_TABLET | Freq: Two times a day (BID) | ORAL | 0 refills | Status: DC

## 2022-01-22 MED ORDER — FAMOTIDINE 20 MG PO TABS: 20.0000 mg | ORAL_TABLET | Freq: Two times a day (BID) | ORAL | 0 refills | Status: AC

## 2022-01-22 MED ORDER — PREDNISONE 10 MG (21) PO TBPK: ORAL_TABLET | ORAL | 0 refills | Status: DC

## 2022-01-22 MED ORDER — HEPARIN SOD (PORK) LOCK FLUSH 10 UNIT/ML IV SOLN
10.0000 [IU] | Freq: Once | INTRAVENOUS | Status: AC
  Administered 2022-01-22: 17:00:00 10 [IU] via INTRAVENOUS
  Filled 2022-01-22: qty 1

## 2022-01-22 NOTE — ED Triage Notes (Signed)
Pt here with gout and joint pain. Pt states that it comes and goes but this morning he was having trouble walking. Pt is on blood thinner and has a R arm PICC for abx. Pt was on a steroid but had a reaction. Pt is in NAD in triage.

## 2022-01-22 NOTE — ED Notes (Addendum)
Pt gave verbal consent for DC ? ?

## 2022-01-22 NOTE — ED Notes (Signed)
Second message sent to pharmacy to get Invanz.

## 2022-01-22 NOTE — Discharge Instructions (Addendum)
Your lab tests today were okay.  Take prednisone as prescribed to help with joint pain.  Take famotidine to help protect from stomach upset as a side effect of the prednisone.

## 2022-01-22 NOTE — ED Provider Notes (Signed)
[facility] Provider Note    Event Date/Time   First MD Initiated Contact with Patient 01/22/22 1100     (approximate)   History   Joint Pain   HPI  Steven Pham is a 61 y.o. male with a history of diverticulitis resulting in bacteremia for which she is currently on PICC line and prolonged IV antibiotics, hypertension, heart failure, atrial fibrillation who comes ED complaining of bilateral hand and feet pain which he states is consistent with his prior gout attacks.  It has been gradually worsening for the past 24 hours.  Worse with ambulation, better with sitting and resting.  Denies falls or trauma.  No fever.  He has been compliant with his medications including antibiotic infusions.  He was scheduled to have an ertapenem infusion today at 1:00 PM     Physical Exam   Triage Vital Signs: ED Triage Vitals  Enc Vitals Group     BP 01/22/22 0943 107/87     Pulse Rate 01/22/22 0943 93     Resp 01/22/22 0943 18     Temp 01/22/22 0943 99.2 F (37.3 C)     Temp Source 01/22/22 0943 Oral     SpO2 01/22/22 0943 100 %     Weight 01/22/22 0944 239 lb (108.4 kg)     Height 01/22/22 0944 5\' 2"  (1.575 m)     Head Circumference --      Peak Flow --      Pain Score 01/22/22 0943 8     Pain Loc --      Pain Edu? --      Excl. in GC? --     Most recent vital signs: Vitals:   01/22/22 1307 01/22/22 1330  BP:  125/81  Pulse: 66 (!) 58  Resp:  18  Temp:    SpO2: 100% 100%     General: Awake, no distress.  CV:  Good peripheral perfusion.  Regular rate and rhythm, normal perfusion, normal pulses Resp:  Normal effort.  Unlabored, clear to auscultation bilaterally Abd:  No distention.  Soft nontender Other:  No rash.  No embolic phenomenon on hands and feet.  No focal tenderness.  Passive range of motion of wrists and ankles intact.  No wounds or discoloration.   ED Results / Procedures / Treatments   Labs (all labs ordered are listed, but only  abnormal results are displayed) Labs Reviewed  CBC WITH DIFFERENTIAL/PLATELET - Abnormal; Notable for the following components:      Result Value   RBC 3.34 (*)    Hemoglobin 8.3 (*)    HCT 25.4 (*)    MCV 76.0 (*)    MCH 24.9 (*)    RDW 17.6 (*)    Platelets 464 (*)    All other components within normal limits  BASIC METABOLIC PANEL - Abnormal; Notable for the following components:   Potassium 3.3 (*)    Calcium 8.7 (*)    All other components within normal limits  URIC ACID     EKG     RADIOLOGY     PROCEDURES:  Critical Care performed: No  Procedures   MEDICATIONS ORDERED IN ED: Medications  ertapenem (INVANZ) 1,000 mg in sodium chloride 0.9 % 100 mL IVPB (has no administration in time range)  colchicine tablet 0.6 mg (0.6 mg Oral Given 01/22/22 1132)  ketorolac (TORADOL) 30 MG/ML injection 15 mg (15 mg Intravenous Given 01/22/22 1121)     IMPRESSION / MDM /  ASSESSMENT AND PLAN / ED COURSE  I reviewed the triage vital signs and the nursing notes.                              Differential diagnosis includes, but is not limited to, electrolyte abnormality, hyperuricemia, gout flare, polyarthritis     Patient presents with polyarthritis which is consistent with what he identifies as a gout syndrome.  Doubt cellulitis osteomyelitis septic arthritis necrotizing fasciitis.  No trauma.  Lab panel unremarkable.  Will give Toradol and colchicine.  Clinical Course as of 01/22/22 1516  Tue Jan 22, 2022  1331 Patient feeling better after colchicine and Toradol.  Pharmacy has arranged for him to receive his outpatient infusion of ertapenem here in the ED, will provide a dressing change of his PICC line, and he will be stable for discharge. [PS]    Clinical Course User Index [PS] Sharman Cheek, MD     FINAL CLINICAL IMPRESSION(S) / ED DIAGNOSES   Final diagnoses:  Polyarthritis     Rx / DC Orders   ED Discharge Orders          Ordered    predniSONE  (STERAPRED UNI-PAK 21 TAB) 10 MG (21) TBPK tablet  Status:  Discontinued        01/22/22 1513    famotidine (PEPCID) 20 MG tablet  2 times daily,   Status:  Discontinued        01/22/22 1513    famotidine (PEPCID) 20 MG tablet  2 times daily        01/22/22 1516    naproxen (NAPROSYN) 500 MG tablet  2 times daily with meals        01/22/22 1516             Note:  This document was prepared using Dragon voice recognition software and may include unintentional dictation errors.   Sharman Cheek, MD 01/22/22 [phone]

## 2022-01-22 NOTE — ED Notes (Signed)
Pt states he has his picc line dressing changed once a week. Pt dressing has date of 01/17/22 pt states they will change dressing on Thursday. Pt dressing is clean dry and intact

## 2022-01-23 ENCOUNTER — Ambulatory Visit
Admission: RE | Admit: 2022-01-23 | Discharge: 2022-01-23 | Disposition: A | Discharge status: Home / Self Care | Payer: BC Managed Care – PPO | Source: Ambulatory Visit | Attending: Infectious Diseases | Admitting: Infectious Diseases

## 2022-01-23 DIAGNOSIS — R7881 Bacteremia: Secondary | ICD-10-CM | POA: Insufficient documentation

## 2022-01-23 MED ORDER — HEPARIN SOD (PORK) LOCK FLUSH 100 UNIT/ML IV SOLN
INTRAVENOUS | Status: AC
  Administered 2022-01-23: 14:00:00 250 [IU]
  Filled 2022-01-23: qty 5

## 2022-01-23 MED ORDER — SODIUM CHLORIDE 0.9 % IV SOLN
1.0000 g | Freq: Once | INTRAVENOUS | Status: AC
  Administered 2022-01-23: 13:00:00 1000 mg via INTRAVENOUS

## 2022-01-23 MED ORDER — HEPARIN SOD (PORK) LOCK FLUSH 100 UNIT/ML IV SOLN: 250.0000 [IU] | INTRAVENOUS | Status: AC | PRN

## 2022-01-23 MED FILL — Ertapenem Sodium For Inj 1 GM (Base Equivalent): INTRAMUSCULAR | Qty: 1 | Status: AC

## 2022-01-23 MED FILL — Sodium Chloride IV Soln 0.9%: INTRAVENOUS | Qty: 100 | Status: AC

## 2022-01-24 ENCOUNTER — Inpatient Hospital Stay: Payer: BC Managed Care – PPO | Admitting: Infectious Diseases

## 2022-01-24 ENCOUNTER — Ambulatory Visit
Admission: RE | Admit: 2022-01-24 | Discharge: 2022-01-24 | Disposition: A | Discharge status: Home / Self Care | Payer: BC Managed Care – PPO | Source: Ambulatory Visit | Attending: Infectious Diseases | Admitting: Infectious Diseases

## 2022-01-24 DIAGNOSIS — K5713 Diverticulitis of small intestine without perforation or abscess with bleeding: Secondary | ICD-10-CM | POA: Diagnosis not present

## 2022-01-24 DIAGNOSIS — I81 Portal vein thrombosis: Secondary | ICD-10-CM | POA: Insufficient documentation

## 2022-01-24 DIAGNOSIS — K751 Phlebitis of portal vein: Secondary | ICD-10-CM | POA: Insufficient documentation

## 2022-01-24 DIAGNOSIS — Q265 Anomalous portal venous connection: Secondary | ICD-10-CM | POA: Insufficient documentation

## 2022-01-24 DIAGNOSIS — R7881 Bacteremia: Secondary | ICD-10-CM | POA: Diagnosis not present

## 2022-01-24 MED ORDER — SODIUM CHLORIDE FLUSH 0.9 % IV SOLN
INTRAVENOUS | Status: AC
  Administered 2022-01-24: 10 mL via INTRAVENOUS
  Filled 2022-01-24: qty 10

## 2022-01-24 MED ORDER — HEPARIN SOD (PORK) LOCK FLUSH 100 UNIT/ML IV SOLN
INTRAVENOUS | Status: AC
  Administered 2022-01-24: 250 [IU] via INTRAVENOUS
  Filled 2022-01-24: qty 5

## 2022-01-24 MED ORDER — SODIUM CHLORIDE 0.9 % IV SOLN
1.0000 g | Freq: Once | INTRAVENOUS | Status: AC
  Administered 2022-01-24: 1000 mg via INTRAVENOUS
  Filled 2022-01-24: qty 1

## 2022-01-24 MED ORDER — HEPARIN SOD (PORK) LOCK FLUSH 100 UNIT/ML IV SOLN: 250.0000 [IU] | Freq: Once | INTRAVENOUS | Status: AC

## 2022-01-24 MED ORDER — SODIUM CHLORIDE 0.9% FLUSH: 10.0000 mL | Freq: Once | INTRAVENOUS | Status: AC

## 2022-01-24 NOTE — Progress Notes (Signed)
I talked to Dr. Rivka Safer about the patient needing a PICC dressing change.  She have me a verbal order to change the PICC dressing tomorrow when he comes in for his antibiotic.

## 2022-01-25 ENCOUNTER — Ambulatory Visit
Admission: RE | Admit: 2022-01-25 | Discharge: 2022-01-25 | Disposition: A | Discharge status: Home / Self Care | Payer: BC Managed Care – PPO | Source: Ambulatory Visit | Attending: Infectious Diseases | Admitting: Infectious Diseases

## 2022-01-25 ENCOUNTER — Other Ambulatory Visit: Payer: Self-pay

## 2022-01-25 DIAGNOSIS — I81 Portal vein thrombosis: Secondary | ICD-10-CM | POA: Diagnosis not present

## 2022-01-25 DIAGNOSIS — K751 Phlebitis of portal vein: Secondary | ICD-10-CM | POA: Diagnosis not present

## 2022-01-25 DIAGNOSIS — Q265 Anomalous portal venous connection: Secondary | ICD-10-CM | POA: Diagnosis not present

## 2022-01-25 DIAGNOSIS — R7881 Bacteremia: Secondary | ICD-10-CM | POA: Diagnosis not present

## 2022-01-25 DIAGNOSIS — K5713 Diverticulitis of small intestine without perforation or abscess with bleeding: Secondary | ICD-10-CM | POA: Diagnosis not present

## 2022-01-25 MED ORDER — SODIUM CHLORIDE 0.9 % IV SOLN
1.0000 g | Freq: Once | INTRAVENOUS | Status: AC
  Filled 2022-01-25: qty 1

## 2022-01-25 MED ORDER — SODIUM CHLORIDE 0.9 % IV SOLN
1.0000 g | Freq: Once | INTRAVENOUS | Status: AC
  Administered 2022-01-25: 1000 mg via INTRAVENOUS
  Filled 2022-01-25: qty 1

## 2022-01-26 ENCOUNTER — Ambulatory Visit
Admission: RE | Admit: 2022-01-26 | Discharge: 2022-01-26 | Disposition: A | Discharge status: Home / Self Care | Payer: BC Managed Care – PPO | Source: Ambulatory Visit | Attending: Infectious Diseases | Admitting: Infectious Diseases

## 2022-01-26 MED ORDER — ERTAPENEM SODIUM 1 G IJ SOLR: 1.0000 g | Freq: Once | INTRAMUSCULAR | Status: DC

## 2022-01-26 MED ORDER — SODIUM CHLORIDE 0.9 % IV SOLN
1.0000 g | Freq: Once | INTRAVENOUS | Status: AC
  Administered 2022-01-26: 1000 mg via INTRAVENOUS
  Filled 2022-01-26: qty 1

## 2022-01-26 NOTE — Progress Notes (Signed)
Pt to periop for Invanz infusion. States concerns of right hand tenderness and swelling. Rt hand confirmed noticeably larger than left. Secure chat sent to Dr. Thedore Mins for input on how to treat.  Will cont to observe and elevate hand when able.  PICC line rt AC cont to flush well w/o any s/s of infection.

## 2022-01-27 ENCOUNTER — Other Ambulatory Visit: Payer: Self-pay

## 2022-01-27 ENCOUNTER — Ambulatory Visit
Admission: RE | Admit: 2022-01-27 | Discharge: 2022-01-27 | Disposition: A | Discharge status: Home / Self Care | Payer: BC Managed Care – PPO | Source: Ambulatory Visit | Attending: Infectious Diseases | Admitting: Infectious Diseases

## 2022-01-27 DIAGNOSIS — R7881 Bacteremia: Secondary | ICD-10-CM | POA: Diagnosis not present

## 2022-01-27 MED ORDER — SODIUM CHLORIDE 0.9 % IV SOLN
1.0000 g | Freq: Once | INTRAVENOUS | Status: AC
  Administered 2022-01-27: 1000 mg via INTRAVENOUS
  Filled 2022-01-27: qty 1

## 2022-01-27 MED ORDER — SODIUM CHLORIDE 0.9 % IV SOLN: 1.0000 g | Freq: Once | INTRAVENOUS | Status: DC

## 2022-01-28 ENCOUNTER — Ambulatory Visit
Admission: RE | Admit: 2022-01-28 | Discharge: 2022-01-28 | Disposition: A | Discharge status: Home / Self Care | Payer: BC Managed Care – PPO | Source: Ambulatory Visit | Attending: Infectious Diseases | Admitting: Infectious Diseases

## 2022-01-28 VITALS — BP 126/83 | HR 97 | Temp 97.0°F | Resp 17

## 2022-01-28 DIAGNOSIS — R6520 Severe sepsis without septic shock: Secondary | ICD-10-CM | POA: Diagnosis not present

## 2022-01-28 DIAGNOSIS — A419 Sepsis, unspecified organism: Secondary | ICD-10-CM | POA: Diagnosis not present

## 2022-01-28 DIAGNOSIS — N179 Acute kidney failure, unspecified: Secondary | ICD-10-CM | POA: Diagnosis not present

## 2022-01-28 MED ORDER — SODIUM CHLORIDE 0.9 % IV SOLN
1.0000 g | Freq: Once | INTRAVENOUS | Status: AC
  Administered 2022-01-28: 1000 mg via INTRAVENOUS
  Filled 2022-01-28: qty 1

## 2022-01-28 MED ORDER — HEPARIN SOD (PORK) LOCK FLUSH 100 UNIT/ML IV SOLN: 250.0000 [IU] | INTRAVENOUS | Status: AC | PRN

## 2022-01-28 MED ORDER — HEPARIN SOD (PORK) LOCK FLUSH 100 UNIT/ML IV SOLN
INTRAVENOUS | Status: AC
  Administered 2022-01-28: 250 [IU]
  Filled 2022-01-28: qty 5

## 2022-01-29 ENCOUNTER — Ambulatory Visit
Admission: RE | Admit: 2022-01-29 | Discharge: 2022-01-29 | Disposition: A | Discharge status: Home / Self Care | Payer: BC Managed Care – PPO | Source: Ambulatory Visit | Attending: Infectious Diseases | Admitting: Infectious Diseases

## 2022-01-29 ENCOUNTER — Ambulatory Visit: Payer: BC Managed Care – PPO | Attending: Infectious Diseases | Admitting: Infectious Diseases

## 2022-01-29 ENCOUNTER — Encounter: Payer: Self-pay | Admitting: Infectious Diseases

## 2022-01-29 ENCOUNTER — Other Ambulatory Visit: Payer: Self-pay

## 2022-01-29 VITALS — BP 134/87 | HR 59 | Temp 98.3°F | Wt 249.0 lb

## 2022-01-29 DIAGNOSIS — Z452 Encounter for adjustment and management of vascular access device: Secondary | ICD-10-CM | POA: Insufficient documentation

## 2022-01-29 DIAGNOSIS — M109 Gout, unspecified: Secondary | ICD-10-CM | POA: Diagnosis not present

## 2022-01-29 DIAGNOSIS — I81 Portal vein thrombosis: Secondary | ICD-10-CM | POA: Insufficient documentation

## 2022-01-29 DIAGNOSIS — K751 Phlebitis of portal vein: Secondary | ICD-10-CM | POA: Diagnosis not present

## 2022-01-29 DIAGNOSIS — I429 Cardiomyopathy, unspecified: Secondary | ICD-10-CM | POA: Insufficient documentation

## 2022-01-29 DIAGNOSIS — R7881 Bacteremia: Secondary | ICD-10-CM | POA: Diagnosis not present

## 2022-01-29 DIAGNOSIS — K5712 Diverticulitis of small intestine without perforation or abscess without bleeding: Secondary | ICD-10-CM | POA: Insufficient documentation

## 2022-01-29 DIAGNOSIS — N189 Chronic kidney disease, unspecified: Secondary | ICD-10-CM | POA: Insufficient documentation

## 2022-01-29 DIAGNOSIS — R6520 Severe sepsis without septic shock: Secondary | ICD-10-CM | POA: Diagnosis not present

## 2022-01-29 DIAGNOSIS — A419 Sepsis, unspecified organism: Secondary | ICD-10-CM

## 2022-01-29 DIAGNOSIS — I129 Hypertensive chronic kidney disease with stage 1 through stage 4 chronic kidney disease, or unspecified chronic kidney disease: Secondary | ICD-10-CM | POA: Diagnosis not present

## 2022-01-29 DIAGNOSIS — I4891 Unspecified atrial fibrillation: Secondary | ICD-10-CM | POA: Insufficient documentation

## 2022-01-29 DIAGNOSIS — Z7901 Long term (current) use of anticoagulants: Secondary | ICD-10-CM | POA: Insufficient documentation

## 2022-01-29 DIAGNOSIS — B955 Unspecified streptococcus as the cause of diseases classified elsewhere: Secondary | ICD-10-CM

## 2022-01-29 DIAGNOSIS — D649 Anemia, unspecified: Secondary | ICD-10-CM | POA: Insufficient documentation

## 2022-01-29 DIAGNOSIS — B9620 Unspecified Escherichia coli [E. coli] as the cause of diseases classified elsewhere: Secondary | ICD-10-CM

## 2022-01-29 DIAGNOSIS — N179 Acute kidney failure, unspecified: Secondary | ICD-10-CM | POA: Diagnosis not present

## 2022-01-29 LAB — CBC WITH DIFFERENTIAL/PLATELET
Abs Immature Granulocytes: 0.26 10*3/uL — ABNORMAL HIGH (ref 0.00–0.07)
Basophils Absolute: 0.1 10*3/uL (ref 0.0–0.1)
Basophils Relative: 1 %
Eosinophils Absolute: 0 10*3/uL (ref 0.0–0.5)
Eosinophils Relative: 0 %
HCT: 25.9 % — ABNORMAL LOW (ref 39.0–52.0)
Hemoglobin: 8.5 g/dL — ABNORMAL LOW (ref 13.0–17.0)
Immature Granulocytes: 3 %
Lymphocytes Relative: 13 %
Lymphs Abs: 1.2 10*3/uL (ref 0.7–4.0)
MCH: 24.9 pg — ABNORMAL LOW (ref 26.0–34.0)
MCHC: 32.8 g/dL (ref 30.0–36.0)
MCV: 75.7 fL — ABNORMAL LOW (ref 80.0–100.0)
Monocytes Absolute: 0.8 10*3/uL (ref 0.1–1.0)
Monocytes Relative: 8 %
Neutro Abs: 6.9 10*3/uL (ref 1.7–7.7)
Neutrophils Relative %: 75 %
Platelets: 460 10*3/uL — ABNORMAL HIGH (ref 150–400)
RBC: 3.42 MIL/uL — ABNORMAL LOW (ref 4.22–5.81)
RDW: 19.1 % — ABNORMAL HIGH (ref 11.5–15.5)
WBC: 9.2 10*3/uL (ref 4.0–10.5)
nRBC: 0.4 % — ABNORMAL HIGH (ref 0.0–0.2)

## 2022-01-29 LAB — COMPREHENSIVE METABOLIC PANEL
ALT: 22 U/L (ref 0–44)
AST: 32 U/L (ref 15–41)
Albumin: 2.2 g/dL — ABNORMAL LOW (ref 3.5–5.0)
Alkaline Phosphatase: 90 U/L (ref 38–126)
Anion gap: 7 (ref 5–15)
BUN: 12 mg/dL (ref 6–20)
CO2: 27 mmol/L (ref 22–32)
Calcium: 8.8 mg/dL — ABNORMAL LOW (ref 8.9–10.3)
Chloride: 103 mmol/L (ref 98–111)
Creatinine, Ser: 0.88 mg/dL (ref 0.61–1.24)
GFR, Estimated: >60 mL/min (ref >60)
Glucose, Bld: 123 mg/dL — ABNORMAL HIGH (ref 70–99)
Potassium: 3.2 mmol/L — ABNORMAL LOW (ref 3.5–5.1)
Sodium: 137 mmol/L (ref 135–145)
Total Bilirubin: 0.5 mg/dL (ref 0.3–1.2)
Total Protein: 7.1 g/dL (ref 6.5–8.1)

## 2022-01-29 MED ORDER — AMOXICILLIN-POT CLAVULANATE 875-125 MG PO TABS: 1.0000 | ORAL_TABLET | Freq: Two times a day (BID) | ORAL | 0 refills | Status: DC

## 2022-01-29 MED ORDER — SODIUM CHLORIDE 0.9 % IV SOLN
1.0000 g | Freq: Once | INTRAVENOUS | Status: AC
  Administered 2022-01-29: 1000 mg via INTRAVENOUS
  Filled 2022-01-29: qty 1

## 2022-01-29 NOTE — Progress Notes (Signed)
NAME: Steven Pham  DOB: 03/16/1961  MRN: [id number]  Date/Time: 01/29/2022 12:05 PM  Subjective:   Follow-up after recent hospitalization. Patient was admitted to the hospital between 01/03/2022 until 01/13/2022. Steven Pham is a 61 y.o. male with a history of hypertension, gout, osteoarthritis was initially in the hospital between 12/14/2021 until 12/19/2021 for left-sided abdominal pain and CT abdomen showed multiple fluid-filled outpouchings in the mid jejunum consistent with small bowel diverticulitis.  There were also changes noted in the adjacent mesentery with findings suggestive of early venous air related to the inflammatory change.  He was seen by surgery and Xarelto management was pursued.  He was initially treated with IV ceftriaxone and Flagyl and sent home on Augmentin for 2 weeks.  He returned to the ED on 12/25/2021 and stayed in the hospital until 12/27/2021 for a gout flareup and was found to be hypotensive.  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  Vascular was consulted and no intervention was required.  Patient was started on Eliquis.  He was treated for gout flareup with steroids and discharged.  Augmentin was stopped during that discharge.  He then developed new A. fib while he was being seen by cardiologist on 01/03/2022 and was asked to go to the ED to get admitted.  So he was in [facility] between 01/03/2022 until 01/13/2022 for A. fib and also fever and blood cultures sent showed E. coli and Streptococcus anginosus.  He was started on IV ceftriaxone. 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  There was gas in the portal vein and this had progressed in  the interval into the right and left portal vein main portal vein and superior mesenteric vein.  Patient was treated with IV antibiotics and was discharged on ertapenem which she has received at the day surgery until today.  He has finished 27 days of IV antibiotic.  The PICC line has been removed today. He is doing much better He does not have any abdominal pain He has no fever.  Appetite much better Labs were done today and that showed a WBC of 9.2, hemoglobin of 8.5 and platelet of 460 He is taking Eliquis Has not seen vascular surgery since discharge.  Past Medical History:  Diagnosis Date   Arthritis    Cardiomyopathy ([place])    a. 11/2021 Echo: EF 20-25%, glob HK. GrIII DD. Nl RV size/fxn. Mildly elev PASP. Sev dil LA, mildly dil RA. Mild to mod MR.   Gout    Hypertension    NSVT (nonsustained ventricular tachycardia) 11/2021   PSVT (paroxysmal supraventricular tachycardia) ([place]) 11/2021    No past surgical history on file.  Social History   Socioeconomic History   Marital status: Single    Spouse name: Not on file   Number of children: Not on file   Years of education: Not on file   Highest education level: Not on file  Occupational History   Not on file  Tobacco Use   Smoking status: Never   Smokeless tobacco: Never  Vaping Use   Vaping Use: Never used  Substance and Sexual Activity   Alcohol use: No   Drug use: No  Sexual activity: Not on file  Other Topics Concern   Not on file  Social History Narrative   Lives locally by himself.  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  Does not routinely exercise.   Social Determinants of Health   Financial Resource Strain: Not on file  Food Insecurity: Not on file  Transportation Needs: Not on file  Physical Activity: Not on file  Stress: Not on file  Social Connections: Not on file  Intimate Partner Violence: Not on file    Family History  Family history unknown: Yes   No Known Allergies I? Current Outpatient  Medications  Medication Sig Dispense Refill   acetaminophen (TYLENOL) 650 MG CR tablet Take 650 mg by mouth every 8 (eight) hours as needed for pain.     amiodarone (PACERONE) 200 MG tablet Take 1 tablet (200 mg total) by mouth 2 (two) times daily. 60 tablet 0   apixaban (ELIQUIS) 5 MG TABS tablet Take 1 tablet (5 mg total) by mouth 2 (two) times daily. 60 tablet 0   famotidine (PEPCID) 20 MG tablet Take 1 tablet (20 mg total) by mouth 2 (two) times daily. 60 tablet 0   naproxen (NAPROSYN) 500 MG tablet Take 1 tablet (500 mg total) by mouth 2 (two) times daily with a meal. 20 tablet 0   ertapenem (INVANZ) IVPB Inject 1 g into the vein daily for 23 days. Indication:  Polymicrobial bacteremia / jejunal diverticulitis with portal vein thrombosis and suppurative pylephlebitis First Dose: Yes Last Day of Therapy:  02/02/22 Labs - Once weekly (Monday or Tuesday):  CBC/D and CMP Method of administration: Mini-Bag Plus / Gravity Method of administration may be changed at the discretion of home infusion pharmacist based upon assessment of the patient and/or caregiver's ability to self-administer the medication ordered. Fax weekly labs to Dr.Wilburt Messina promptly [phone] Call [phone] with any questions or critical values (Patient not taking: Reported on 01/29/2022) 23 Units 0   ferrous sulfate 325 (65 FE) MG tablet Take 1 tablet (325 mg total) by mouth daily with breakfast. (Patient not taking: Reported on 01/29/2022) 30 tablet 0   No current facility-administered medications for this visit.     Abtx:  Anti-infectives (From admission, onward)    None       REVIEW OF SYSTEMS:  Const: negative fever, negative chills, negative weight loss Eyes: negative diplopia or visual changes, negative eye pain ENT: negative coryza, negative sore throat Resp: negative cough, hemoptysis, dyspnea Cards: negative for chest pain, palpitations, lower extremity edema GU: negative for frequency, dysuria and  hematuria GI: Negative for abdominal pain, diarrhea, bleeding, constipation Skin: negative for rash and pruritus Heme: negative for easy bruising and gum/nose bleeding MS: Some weakness. Pain in joints much better Neurolo:negative for headaches, dizziness, vertigo, memory problems  Psych: negative for feelings of anxiety, depression  Endocrine: negative for thyroid, diabetes Allergy/Immunology- negative for any medication or food allergies ? Objective:  VITALS:  BP 134/87    Pulse (!) 59    Temp 98.3 F (36.8 C) (Oral)    Wt 249 lb (112.9 kg)    BMI 45.54 kg/m  PHYSICAL EXAM:  General: Alert, cooperative, no distress, appears stated age.  Head: Normocephalic, without obvious abnormality, atraumatic. Eyes: Conjunctivae clear, anicteric sclerae. Pupils are equal ENT Nares normal. No drainage or sinus tenderness. Lips, mucosa, and tongue normal. No Thrush Neck: Supple, symmetrical, no adenopathy, thyroid: non tender no carotid bruit and no JVD. Back: No CVA tenderness. Lungs: Clear to  auscultation bilaterally. No Wheezing or Rhonchi. No rales. Heart: S1-S2 Abdomen: Soft, non-tender,not distended. Bowel sounds normal. No masses Extremities: Right arm PICC has been removed and there is a dressing in place.  Atraumatic, no cyanosis. No edema. No clubbing Skin: No rashes or lesions. Or bruising Lymph: Cervical, supraclavicular normal. Neurologic: Grossly non-focal Pertinent Labs Lab Results CBC    Component Value Date/Time   WBC 9.2 01/29/2022 1033   RBC 3.42 (L) 01/29/2022 1033   HGB 8.5 (L) 01/29/2022 1033   HCT 25.9 (L) 01/29/2022 1033   PLT 460 (H) 01/29/2022 1033   MCV 75.7 (L) 01/29/2022 1033   MCH 24.9 (L) 01/29/2022 1033   MCHC 32.8 01/29/2022 1033   RDW 19.1 (H) 01/29/2022 1033   LYMPHSABS 1.2 01/29/2022 1033   MONOABS 0.8 01/29/2022 1033   EOSABS 0.0 01/29/2022 1033   BASOSABS 0.1 01/29/2022 1033    CMP Latest Ref Rng & Units 01/29/2022 01/22/2022 01/21/2022   Glucose 70 - 99 mg/dL 123(H) 93 86  BUN 6 - 20 mg/dL 12 6 6   Creatinine 0.61 - 1.24 mg/dL 0.88 0.94 0.88  Sodium 135 - 145 mmol/L 137 138 137  Potassium 3.5 - 5.1 mmol/L 3.2(L) 3.3(L) 3.2(L)  Chloride 98 - 111 mmol/L 103 102 104  CO2 22 - 32 mmol/L 27 26 26   Calcium 8.9 - 10.3 mg/dL 8.8(L) 8.7(L) 8.6(L)  Total Protein 6.5 - 8.1 g/dL 7.1 - 7.9  Total Bilirubin 0.3 - 1.2 mg/dL 0.5 - 0.8  Alkaline Phos 38 - 126 U/L 90 - 109  AST 15 - 41 U/L 32 - 18  ALT 0 - 44 U/L 22 - 17   ? Impression/Recommendation Polymicrobial bacteremia from the gastrointestinal tract.  E. coli and Streptococcus anginosus.  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  He Also Had Septic Pylephlebitis of Portal Vein, Splenic, Superior Mesenteric Vein. Completed 27 Days of appropriate IV antibiotics We will put him on p.o. Augmentin for a week or 2. He has to follow-up with vascular We may have to repeat his CT abdomen to see whether the gas in the veins have resolved  Cardiomyopathy with EF 30 to 35%  A. fib which has been new onset is now well controlled and he is on amiodarone.  Also on Eliquis.  Anemia.  Stable.  Had received blood transfusion while in the hospital.  CKD normal creatinine clearance today.  Gout much under control I discussed with vascular nurse practitioner and patient will be seen in their clinic soon. Will decide whether I need to see him after that.  Discussed with patient in great detail. He will follow-up with his PCP. ___________________________________________________  Note:  This document was prepared using Dragon voice recognition software and may include unintentional dictation errors.

## 2022-01-29 NOTE — Patient Instructions (Addendum)
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 You have completed 24 days of IV antibiotic and will give you a week of augmentin 875mg  PO BID Please follow up with vascular surgeon Dr.Mondy or his partner [phone]

## 2022-01-29 NOTE — Progress Notes (Signed)
Patient came in labs drawn from PICC and last dose of Invanz given.  After Steven Pham finished patient was laid back and prepped for PICC removal.  PICC was removed without difficulty and measurement at 40cm.  Pressure was held for 10 minutes and patient remained flat for total of 25 minutes.  Occlusive dressing was placed and had no bleeding at discharge.  VSS.  Patient said the pain was better in his hands, and they looked less swollen than his previous visits.  Patient had an appointment at the ID office in [facility], so this RN rolled him over to the office.

## 2022-01-31 ENCOUNTER — Telehealth: Payer: Self-pay

## 2022-01-31 NOTE — Telephone Encounter (Signed)
I attempted to contact the patient to follow up with him regarding scheduled a follow up with his vascular surgeon Dr. Derrill Memo per Dr. Rivka Safer. Patient did not answer and no voicemail setup. Steven Pham T [name]

## 2022-01-31 NOTE — Telephone Encounter (Signed)
Spoke with patient, provided him with phone number for vascular surgeon's office. He states he will call now to set up an appointment.   Sandie Ano, RN

## 2022-02-02 NOTE — H&P (View-Only) (Signed)
Cardiology Office Note:    Date:  02/04/2022   ID:  Steven Pham, DOB [birth year], MRN [id number]  PCP:  [facility] [place] Providers Cardiologist:  Kathlyn Sacramento, MD     Referring MD: [facility] [place]   Chief Complaint  Patient presents with   Other    Afib c/o gout left side/ Pt not taking Amiodarone and has questions what he should be taking. Meds reviewed verbally with pt.    History of Present Illness:    Steven Pham is a 61 y.o. male with a hx of HTN, chronic combined CHF, dilated cardiomyopathy, PAF on chronic antiarrythmic and on chronic anticoagulation, gout, anemia of chronic disease  He presented to the [facility] ED on 12/16 with abdominal pain, n/v, diaphoresis. He was found to be tachycardic at 131 with biatrial enlargement, LVH, and TWI in I, AVL,, and V2. Labs revealed hypokalemia (2.8), AKI (creat 1.85), and elevated HsTrops with peak at 113 then downtrending felt to be demand ischemia. CT revealed small bowel diverticulitis w/inflammatory changes with recommendation from general surgery for conservative management. Throughout admission, had intermittent tachycardia with runs of SVT and NSVT. Echo revealed LVEF 20-25%, global hypokinesis, grade 3 DD, sev dil LA, nl RV, and mild to moderate mitral regurgitation. He was asymptomatic. Cardiology was consulted and he was switched from metoprolol to carvedilol and losartan and spironolactone were added. R/LHC recommended, with consideration to be done as OP. Discharged 12/19/21.   He returned to [facility] ED on 12/27 with c/o gout flare. He was found to be hypotensive with BP 76/48. CXR concerning for thrombus from main portal vein into left portal vein with recommendation for repeat CT w/IV contrast. Improved inflammatory changes of small bowel. Vascular surgery recommended IV heparin during admission. Antihypertensives were held in the setting of hypotension and AKI and he was transitioned to Eliquis.  He was discharged 12/29.   At follow-up office visit on 01/03/22 with Dr. Fletcher Anon, he reported taking Eliqius only since hospital discharge. He was found to be in atrial fib with RVR at rate of 150 bpm and mildly hypotensive. He was felt to be volume depleted with renal failure given continued poor appetite and weight loss. He denied chest pain and SOB. He was advised to go to the ED for rate control due to hypotension and AKI with recommendation to start IV amiodarone, hold Eliquis and start IV heparin with plans for [facility] prior to discharge. He converted to SR on IV amiodarone but BP remained soft. IV amio was transition to oral but unable to add beta blocker due to soft BP. Lactate was elevated but improved during admission. Plan was for [facility] on 01/07/22, however it as cancelled due to bacteremia, worsening anemia, and persistent hypotension. He was changed from IV heparin to Lovenox per vascular surgery for portal vein thrombus, persistent hypotension limited GDMT for HFrEF. Blood cultures found to be growing strep and E coli. EF improved slightly to 30-35% by echo on 01/06/22 but he remained euvolemic, and continued to have pain associated with gout. At discharge on 01/13/22, plan was to continue antibx via PICC,    He returned to [facility] ED on 01/22/22 for gout pain and was treated with colchicine and Toradol.   Today, he is here alone for posthospital follow-up.  He reports he has been doing well at home other than pain in his left foot and left hand due to gout. Per his report, was supposed to receive medication for gout  but he brought all medication bottles and there is nothing for gout or pain. During recent admissions Dec 22 and Jan 23, evaluation for new cardiomyopathy complicated by sepsis and anemia. He will finish oral antibiotics in 2 days and no longer has PICC line.  He denies chest pain, shortness of breath, palpitations, melena, hematuria, hemoptysis, diaphoresis, weakness, presyncope, syncope,  orthopnea, and PND. Left foot is swollen > right. No overt bleeding concerns. Does not have amiodarone and does not have clear understanding of medications he should have received after discharge. Has not returned to work and states he needs a work note.  Has not been very active at home.  Has a friend who helps him on occasion and can drive him to appointments.     Past Medical History:  Diagnosis Date   Arthritis    Cardiomyopathy ([place])    a. 11/2021 Echo: EF 20-25%, glob HK. GrIII DD. Nl RV size/fxn. Mildly elev PASP. Sev dil LA, mildly dil RA. Mild to mod MR.   Gout    Hypertension    NSVT (nonsustained ventricular tachycardia) 11/2021   PSVT (paroxysmal supraventricular tachycardia) ([place]) 11/2021    History reviewed. No pertinent surgical history.  Current Medications: Current Meds  Medication Sig   acetaminophen (TYLENOL) 650 MG CR tablet Take 650 mg by mouth every 8 (eight) hours as needed for pain.   amoxicillin-clavulanate (AUGMENTIN) 875-125 MG tablet Take 1 tablet by mouth 2 (two) times daily.   apixaban (ELIQUIS) 5 MG TABS tablet Take 1 tablet (5 mg total) by mouth 2 (two) times daily.   colchicine 0.6 MG tablet Take 1 tablet (0.6 mg total) by mouth daily.   famotidine (PEPCID) 20 MG tablet Take 1 tablet (20 mg total) by mouth 2 (two) times daily.   potassium chloride SA (KLOR-CON M) 20 MEQ tablet Take 1 tablet (20 mEq total) by mouth as directed. Take 1 tablet twice a day for 3 days after that you will take 1 tablet daily.   [DISCONTINUED] ferrous sulfate 325 (65 FE) MG tablet Take 1 tablet (325 mg total) by mouth daily with breakfast.     Allergies:   Patient has no known allergies.   Social History   Socioeconomic History   Marital status: Single    Spouse name: Not on file   Number of children: Not on file   Years of education: Not on file   Highest education level: Not on file  Occupational History   Not on file  Tobacco Use   Smoking status: Never    Smokeless tobacco: Never  Vaping Use   Vaping Use: Never used  Substance and Sexual Activity   Alcohol use: No   Drug use: No   Sexual activity: Not on file  Other Topics Concern   Not on file  Social History Narrative   Lives locally by himself.  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  Does not routinely exercise.   Social Determinants of Health   Financial Resource Strain: Not on file  Food Insecurity: Not on file  Transportation Needs: Not on file  Physical Activity: Not on file  Stress: Not on file  Social Connections: Not on file     Family History: The patient's family history includes Coronary artery disease in his mother.  ROS:   Please see the history of present illness.    + left foot swelling + left foot pain + left hand pain All other systems reviewed and are negative.  Labs/Other  Studies Reviewed:    The following studies were reviewed today:  Echo 01/06/22  No evidence of valve vegetation. Left Ventricle: Left ventricular ejection fraction, by estimation, is 30  to 35%. The left ventricle has moderately decreased function. The left  ventricle demonstrates global hypokinesis. Definity contrast agent was  given IV to delineate the left ventricular endocardial borders. The left ventricular internal cavity size was mildly to moderately dilated. There is no left ventricular  hypertrophy. Left ventricular diastolic parameters are consistent with  Grade II diastolic dysfunction  (pseudonormalization).  Right Ventricle: The right ventricular size is normal. No increase in  right ventricular wall thickness. Right ventricular systolic function is  normal. There is normal pulmonary artery systolic pressure. The tricuspid  regurgitant velocity is 2.46 m/s, and  with an assumed right atrial pressure of 5 mmHg, the estimated right ventricular systolic pressure is [id number] mmHg.  Left Atrium: Left atrial size was moderately dilated.  Right Atrium: Right atrial size was normal  in size.  Pericardium: There is no evidence of pericardial effusion.  Mitral Valve: The mitral valve is normal in structure. No evidence of  mitral valve regurgitation. No evidence of mitral valve stenosis.  Tricuspid Valve: The tricuspid valve is normal in structure. Tricuspid  valve regurgitation is mild . No evidence of tricuspid stenosis.  Aortic Valve: The aortic valve is normal in structure. Aortic valve  regurgitation is not visualized. No aortic stenosis is present. Aortic  valve peak gradient measures 9.1 mmHg.  Pulmonic Valve: The pulmonic valve was normal in structure. Pulmonic valve  regurgitation is not visualized. No evidence of pulmonic stenosis.  Aorta: The aortic root is normal in size and structure.  Venous: The inferior vena cava is normal in size with greater than 50%  respiratory variability, suggesting right atrial pressure of 3 mmHg.  IAS/Shunts: No atrial level shunt detected by color flow Doppler.    Echo 12/18/21  Left Ventricle: Left ventricular ejection fraction, by estimation, is 20  to 25%. The left ventricle has severely decreased function. The left  ventricle demonstrates global hypokinesis. The left ventricular internal  cavity size was mildly dilated. There is mild left ventricular hypertrophy. Left ventricular diastolic parameters are consistent with Grade III diastolic dysfunction (restrictive).  Right Ventricle: The right ventricular size is normal. No increase in  right ventricular wall thickness. Right ventricular systolic function is  normal. There is mildly elevated pulmonary artery systolic pressure. The  tricuspid regurgitant velocity is 3.22   m/s, and with an assumed right atrial pressure of 3 mmHg, the estimated  right ventricular systolic pressure is [id number] mmHg.  Left Atrium: Left atrial size was severely dilated.  Right Atrium: Right atrial size was mildly dilated.  Pericardium: There is no evidence of pericardial effusion.  Mitral Valve:  The mitral valve is normal in structure. Mild to moderate  mitral valve regurgitation. No evidence of mitral valve stenosis.  Tricuspid Valve: The tricuspid valve is normal in structure. Tricuspid  valve regurgitation is mild . No evidence of tricuspid stenosis.  Aortic Valve: The aortic valve is normal in structure. Aortic valve  regurgitation is not visualized. No aortic stenosis is present. Aortic  valve mean gradient measures 3.0 mmHg. Aortic valve peak gradient measures  4.0 mmHg. Aortic valve area, by VTI measures 2.50 cm.  Pulmonic Valve: The pulmonic valve was normal in structure. Pulmonic valve  regurgitation is not visualized. No evidence of pulmonic stenosis.  Aorta: The aortic root is normal in size and structure.  Venous: The inferior vena cava is normal in size with greater than 50%  respiratory variability, suggesting right atrial pressure of 3 mmHg.  IAS/Shunts: No atrial level shunt detected by color flow Doppler.    Recent Labs: 12/15/2021: TSH 0.822 01/08/2022: Magnesium 1.9 01/11/2022: B Natriuretic Peptide 341.9 01/29/2022: ALT 22; BUN 12; Creatinine, Ser 0.88; Hemoglobin 8.5; Platelets 460; Potassium 3.2; Sodium 137   Recent Lipid Panel    Component Value Date/Time   CHOL 226 (H) 01/04/2022 0550   TRIG 305 (H) 01/04/2022 0550   HDL 14 (L) 01/04/2022 0550   CHOLHDL 16.1 01/04/2022 0550   VLDL 61 (H) 01/04/2022 0550   LDLCALC 151 (H) 01/04/2022 0550     Risk Assessment/Calculations:    CHA2DS2-VASc Score = 2  This indicates a 2.2% annual risk of stroke. The patient's score is based upon: CHF History: 1 HTN History: 1 Diabetes History: 0 Stroke History: 0 Vascular Disease History: 0 Age Score: 0 Gender Score: 0   Physical Exam:    VS:  BP 98/60 (BP Location: Right Arm, Patient Position: Sitting, Cuff Size: Normal)    Pulse 100    Ht 5\' 3"  (1.6 m)    Wt 243 lb (110.2 kg)    SpO2 96%    BMI 43.05 kg/m     Wt Readings from Last 3 Encounters:   02/04/22 243 lb (110.2 kg)  01/29/22 249 lb (112.9 kg)  01/22/22 239 lb (108.4 kg)     GEN:  Well nourished, well developed in no acute distress HEENT: Normal NECK: No JVD; No carotid bruits CARDIAC: RRR, no murmurs, rubs, gallops RESPIRATORY:  Clear to auscultation without rales, wheezing or rhonchi  ABDOMEN: Soft, non-tender, non-distended MUSCULOSKELETAL:  Mild, non-pitting edema LLE. No edema RLE. No deformity. 2+ pedal pulses, equal bilaterally SKIN: Warm and dry NEUROLOGIC:  Alert and oriented x 3 PSYCHIATRIC:  Normal affect   EKG:  EKG is ordered today.  The ekg ordered today demonstrates sinus tachycardia at 100 bpm, LAD, LVH  Diagnoses:    1. Atrial fibrillation with RVR ([place])   2. Chronic systolic heart failure ([place])   3. Portal vein thrombosis   4. Other cardiomyopathy ([place])   5. Acute idiopathic gout of left hand   6. Microcytic anemia    Assessment and Plan:     Chronic combined CHF: LVEF 30-35%, G2DD, global hypokinesis, mildly to moderately dilated LV, normal RV, mod dilated LA by echo 01/06/22. Appears euvolemic on exam. Denies SOB, orthopnea, PND. Mild edema LLE. GDMT limited by hypotension. Plan for LHC in 1 week pending stable hgb as noted below.   Cardiomyopathy: Uncertain whether ICM or NICM. EF 30-35%, global hypokinesis by echo. Euvolemic on exam.  GDMT limited by hypotension. Needs ischemia evaluation as Plan for LHC in 1 week pending stable labs.  We will hold Eliquis 48 hours prior to cath.   PAF/NSVT on chronic anticoagulation: In sinus rhythm today, rate 100 bpm..Currently off amiodarone by mistake. With high risk for reoccurrence of arrhythmia, will resume amiodarone 200 mg once daily. Plan for this not to be long-term management due to his young age.  Plan for recheck of maintenance TSH, complete metabolic panel at next office visit. Continue Eliquis.   Portal vein thrombosis: Noted on CT during admission 1/6-1/15/23. On eliquis. Management per  vascular surgery.   Microcytic anemia: Felt to be consistent with anemia of chronic disease per IM. Hgb 8.5 on 1/31. Will recheck CBC today.   Gout: Acute gout flare  of left hand and left foot, treated in the ED 01/22/22.  Not on home therapy.  We will give him colchicine 0.6 mg once daily x 30 days with no refills.  Advised further management per PCP. ° °Hypokalemia: K+ 3.2 1/31. We will give him 40 mEq of potassium chloride for 3 days then 20 mEq daily.  We will recheck on 02/11/2022.  ° °Disposition: LHC 1/13, 2 weeks post cath follow-up with Dr. Arida or APP ° °Shared Decision Making/Informed Consent °The risks [stroke (1 in 1000), death (1 in 1000), kidney failure [usually temporary] (1 in 500), bleeding (1 in 200), allergic reaction [possibly serious] (1 in 200)], benefits (diagnostic support and management of coronary artery disease) and alternatives of a cardiac catheterization were discussed in detail with Mr. Lorek and he is willing to proceed.  ° ° °Medication Adjustments/Labs and Tests Ordered: °Current medicines are reviewed at length with the patient today.  Concerns regarding medicines are outlined above.  °Orders Placed This Encounter  °Procedures  ° CBC  ° Basic metabolic panel  ° EKG 12-Lead  ° °Meds ordered this encounter  °Medications  ° amiodarone (PACERONE) 200 MG tablet  °  Sig: Take 1 tablet (200 mg total) by mouth daily.  °  Dispense:  90 tablet  °  Refill:  0  ° potassium chloride SA (KLOR-CON M) 20 MEQ tablet  °  Sig: Take 1 tablet (20 mEq total) by mouth as directed. Take 1 tablet twice a day for 3 days after that you will take 1 tablet daily.  °  Dispense:  33 tablet  °  Refill:  3  ° colchicine 0.6 MG tablet  °  Sig: Take 1 tablet (0.6 mg total) by mouth daily.  °  Dispense:  30 tablet  °  Refill:  0  °  Order Specific Question:   Supervising Provider  °  Answer:   NAHSER, PHILIP J [[id number]]  ° ° °Patient Instructions  °Medication Instructions:  °Your physician has recommended you make  the following change in your medication:  ° °START Potassium 20 mEq twice a day for 3 days THEN take once daily °Amiodarone 200 mg once daily °Colchicine 0.6 mg once daily. One month supply sent but you will need to follow up with your primary care provider for further refills.   ° °*If you need a refill on your cardiac medications before your next appointment, please call your pharmacy* ° ° °Lab Work: °CBC today.  °BMET On Monday February 13th [facility] and check at registration. Let them know you are there for labs first then your heart cath.  ° °If you have labs (blood work) drawn today and your tests are completely normal, you will receive your results only by: °MyChart Message (if you have MyChart) OR °A paper copy in the mail °If you have any lab test that is abnormal or we need to change your treatment, we will call you to review the results. ° ° °Testing/Procedures: °[facility] Cardiac Cath Instructions ° °You are scheduled for a Cardiac Cath on: Monday February 13th °Please arrive at 08:00 am on the day of your procedure °Please expect a call from our [facility] to pre-register you °Do not eat/drink anything after midnight °Someone will need to drive you home °It is recommended someone be with you for the first 24 hours after your procedure °Wear clothes that are easy to get on/off and wear slip on shoes if possible ° ° °  Medications bring a current list of all medications with you  Instructions regarding medication:   _XX___ : Hold Eliquis on Saturday, Sunday, and morning of your procedure. _XX__ You may take all of your other medications the morning of your procedure with enough water to swallow safely   Day of your procedure: Arrive at the [facility] entrance.  Free valet service is available.  After entering the [facility] please check-in at the registration desk (1st desk on your right) to receive your armband. After receiving your armband someone will escort you to  the cardiac cath/special procedures waiting area.  The usual length of stay after your procedure is about 2 to 3 hours.  This can vary.  If you have any questions, please call our office at [phone], or you may call the cardiac cath lab at [facility] directly at [phone]    Follow-Up: At [facility], you and your health needs are our priority.  As part of our continuing mission to provide you with exceptional heart care, we have created designated Provider Care Teams.  These Care Teams include your primary Cardiologist (physician) and Advanced Practice Providers (APPs -  Physician Assistants and Nurse Practitioners) who all work together to provide you with the care you need, when you need it.  We recommend signing up for the patient portal called "MyChart".  Sign up information is provided on this After Visit Summary.  MyChart is used to connect with patients for Virtual Visits (Telemedicine).  Patients are able to view lab/test results, encounter notes, upcoming appointments, etc.  Non-urgent messages can be sent to your provider as well.   To learn more about what you can do with MyChart, go to [email or web address].    Your next appointment:   2 week(s) POST cath  The format for your next appointment:   In Person  Provider:   Kathlyn Sacramento, MD or Christell Faith, PA-C     Signed, Avana Kreiser, Lanice Schwab, NP  02/04/2022 11:55 AM    [place]

## 2022-02-02 NOTE — Progress Notes (Signed)
Cardiology Office Note:    Date:  02/04/2022   ID:  Steven Pham, DOB 01-Jul-1961, MRN [id number]  PCP:  [facility] [place] Prov[facility] Cardiologist:  Kathlyn [place], MD     Referring MD: [facility] [place]   Chief Complaint  Patient presents with   Other    Afib c/o gout left side/ Pt not taking Amiodarone and has questions what he should be taking. Meds reviewed verbally with pt.    History of Present Illness:    Steven Pham is a 61 y.o. male with a hx of HTN, chronic combined CHF, dilated cardiomyopathy, PAF on chronic antiarrythmic and on chronic anticoagulation, gout, anemia of chronic disease  He presented to the [facility] ED on 12/16 with abdominal pain, n/v, diaphoresis. He was found to be tachycardic at 131 with biatrial enlargement, LVH, and TWI in I, AVL,, and V2. Labs revealed hypokalemia (2.8), AKI (creat 1.85), and elevated HsTrops with peak at 113 then downtrending felt to be demand ischemia. CT revealed small bowel diverticulitis w/inflammatory changes with recommendation from general surgery for conservative management. Throughout admission, had intermittent tachycardia with runs of SVT and NSVT. Echo revealed LVEF 20-25%, global hypokinesis, grade 3 DD, sev dil LA, nl RV, and mild to moderate mitral regurgitation. He was asymptomatic. Cardiology was consulted and he was switched from metoprolol to carvedilol and losartan and spironolactone were added. R/LHC recommended, with consideration to be done as OP. Discharged 12/19/21.   He returned to [facility] ED on 12/27 with c/o gout flare. He was found to be hypotensive with BP 76/48. CXR concerning for thrombus from main portal vein into left portal vein with recommendation for repeat CT w/IV contrast. Improved inflammatory changes of small bowel. Vascular surgery recommended IV heparin during admission. Antihypertensives were held in the setting of hypotension and AKI and he was transitioned to Eliquis.  He was discharged 12/29.   At follow-up office visit on 01/03/22 with Dr. Fletcher Anon, he reported taking Eliqius only since hospital discharge. He was found to be in atrial fib with RVR at rate of 150 bpm and mildly hypotensive. He was felt to be volume depleted with renal failure given continued poor appetite and weight loss. He denied chest pain and SOB. He was advised to go to the ED for rate control due to hypotension and AKI with recommendation to start IV amiodarone, hold Eliquis and start IV heparin with plans for [facility] prior to discharge. He converted to SR on IV amiodarone but BP remained soft. IV amio was transition to oral but unable to add beta blocker due to soft BP. Lactate was elevated but improved during admission. Plan was for [facility] on 01/07/22, however it as cancelled due to bacteremia, worsening anemia, and persistent hypotension. He was changed from IV heparin to Lovenox per vascular surgery for portal vein thrombus, persistent hypotension limited GDMT for HFrEF. Blood cultures found to be growing strep and E coli. EF improved slightly to 30-35% by echo on 01/06/22 but he remained euvolemic, and continued to have pain associated with gout. At discharge on 01/13/22, plan was to continue antibx via PICC,    He returned to [facility] ED on 01/22/22 for gout pain and was treated with colchicine and Toradol.   Today, he is here alone for posthospital follow-up.  He reports he has been doing well at home other than pain in his left foot and left hand due to gout. Per his report, was supposed to receive medication for gout  but he brought all medication bottles and there is nothing for gout or pain. During recent admissions Dec 22 and Jan 23, evaluation for new cardiomyopathy complicated by sepsis and anemia. He will finish oral antibiotics in 2 days and no longer has PICC line.  He denies chest pain, shortness of breath, palpitations, melena, hematuria, hemoptysis, diaphoresis, weakness, presyncope, syncope,  orthopnea, and PND. Left foot is swollen > right. No overt bleeding concerns. Does not have amiodarone and does not have clear understanding of medications he should have received after discharge. Has not returned to work and states he needs a work note.  Has not been very active at home.  Has a friend who helps him on occasion and can drive him to appointments.     Past Medical History:  Diagnosis Date   Arthritis    Cardiomyopathy ([place])    a. 11/2021 Echo: EF 20-25%, glob HK. GrIII DD. Nl RV size/fxn. Mildly elev PASP. Sev dil LA, mildly dil RA. Mild to mod MR.   Gout    Hypertension    NSVT (nonsustained ventricular tachycardia) 11/2021   PSVT (paroxysmal supraventricular tachycardia) ([place]) 11/2021    History reviewed. No pertinent surgical history.  Current Medications: Current Meds  Medication Sig   acetaminophen (TYLENOL) 650 MG CR tablet Take 650 mg by mouth every 8 (eight) hours as needed for pain.   amoxicillin-clavulanate (AUGMENTIN) 875-125 MG tablet Take 1 tablet by mouth 2 (two) times daily.   apixaban (ELIQUIS) 5 MG TABS tablet Take 1 tablet (5 mg total) by mouth 2 (two) times daily.   colchicine 0.6 MG tablet Take 1 tablet (0.6 mg total) by mouth daily.   famotidine (PEPCID) 20 MG tablet Take 1 tablet (20 mg total) by mouth 2 (two) times daily.   potassium chloride SA (KLOR-CON M) 20 MEQ tablet Take 1 tablet (20 mEq total) by mouth as directed. Take 1 tablet twice a day for 3 days after that you will take 1 tablet daily.   [DISCONTINUED] ferrous sulfate 325 (65 FE) MG tablet Take 1 tablet (325 mg total) by mouth daily with breakfast.     Allergies:   Patient has no known allergies.   Social History   Socioeconomic History   Marital status: Single    Spouse name: Not on file   Number of children: Not on file   Years of education: Not on file   Highest education level: Not on file  Occupational History   Not on file  Tobacco Use   Smoking status: Never    Smokeless tobacco: Never  Vaping Use   Vaping Use: Never used  Substance and Sexual Activity   Alcohol use: No   Drug use: No   Sexual activity: Not on file  Other Topics Concern   Not on file  Social History Narrative   Lives locally by himself.  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  Does not routinely exercise.   Social Determinants of Health   Financial Resource Strain: Not on file  Food Insecurity: Not on file  Transportation Needs: Not on file  Physical Activity: Not on file  Stress: Not on file  Social Connections: Not on file     Family History: The patient's family history includes Coronary artery disease in his mother.  ROS:   Please see the history of present illness.    + left foot swelling + left foot pain + left hand pain All other systems reviewed and are negative.  Labs/Other  Studies Reviewed:    The following studies were reviewed today:  Echo 01/06/22  No evidence of valve vegetation. Left Ventricle: Left ventricular ejection fraction, by estimation, is 30  to 35%. The left ventricle has moderately decreased function. The left  ventricle demonstrates global hypokinesis. Definity contrast agent was  given IV to delineate the left ventricular endocardial borders. The left ventricular internal cavity size was mildly to moderately dilated. There is no left ventricular  hypertrophy. Left ventricular diastolic parameters are consistent with  Grade II diastolic dysfunction  (pseudonormalization).  Right Ventricle: The right ventricular size is normal. No increase in  right ventricular wall thickness. Right ventricular systolic function is  normal. There is normal pulmonary artery systolic pressure. The tricuspid  regurgitant velocity is 2.46 m/s, and  with an assumed right atrial pressure of 5 mmHg, the estimated right ventricular systolic pressure is [id number] mmHg.  Left Atrium: Left atrial size was moderately dilated.  Right Atrium: Right atrial size was normal  in size.  Pericardium: There is no evidence of pericardial effusion.  Mitral Valve: The mitral valve is normal in structure. No evidence of  mitral valve regurgitation. No evidence of mitral valve stenosis.  Tricuspid Valve: The tricuspid valve is normal in structure. Tricuspid  valve regurgitation is mild . No evidence of tricuspid stenosis.  Aortic Valve: The aortic valve is normal in structure. Aortic valve  regurgitation is not visualized. No aortic stenosis is present. Aortic  valve peak gradient measures 9.1 mmHg.  Pulmonic Valve: The pulmonic valve was normal in structure. Pulmonic valve  regurgitation is not visualized. No evidence of pulmonic stenosis.  Aorta: The aortic root is normal in size and structure.  Venous: The inferior vena cava is normal in size with greater than 50%  respiratory variability, suggesting right atrial pressure of 3 mmHg.  IAS/Shunts: No atrial level shunt detected by color flow Doppler.    Echo 12/18/21  Left Ventricle: Left ventricular ejection fraction, by estimation, is 20  to 25%. The left ventricle has severely decreased function. The left  ventricle demonstrates global hypokinesis. The left ventricular internal  cavity size was mildly dilated. There is mild left ventricular hypertrophy. Left ventricular diastolic parameters are consistent with Grade III diastolic dysfunction (restrictive).  Right Ventricle: The right ventricular size is normal. No increase in  right ventricular wall thickness. Right ventricular systolic function is  normal. There is mildly elevated pulmonary artery systolic pressure. The  tricuspid regurgitant velocity is 3.22   m/s, and with an assumed right atrial pressure of 3 mmHg, the estimated  right ventricular systolic pressure is [id number] mmHg.  Left Atrium: Left atrial size was severely dilated.  Right Atrium: Right atrial size was mildly dilated.  Pericardium: There is no evidence of pericardial effusion.  Mitral Valve:  The mitral valve is normal in structure. Mild to moderate  mitral valve regurgitation. No evidence of mitral valve stenosis.  Tricuspid Valve: The tricuspid valve is normal in structure. Tricuspid  valve regurgitation is mild . No evidence of tricuspid stenosis.  Aortic Valve: The aortic valve is normal in structure. Aortic valve  regurgitation is not visualized. No aortic stenosis is present. Aortic  valve mean gradient measures 3.0 mmHg. Aortic valve peak gradient measures  4.0 mmHg. Aortic valve area, by VTI measures 2.50 cm.  Pulmonic Valve: The pulmonic valve was normal in structure. Pulmonic valve  regurgitation is not visualized. No evidence of pulmonic stenosis.  Aorta: The aortic root is normal in size and structure.  Venous: The inferior vena cava is normal in size with greater than 50%  respiratory variability, suggesting right atrial pressure of 3 mmHg.  IAS/Shunts: No atrial level shunt detected by color flow Doppler.    Recent Labs: 12/15/2021: TSH 0.822 01/08/2022: Magnesium 1.9 01/11/2022: B Natriuretic Peptide 341.9 01/29/2022: ALT 22; BUN 12; Creatinine, Ser 0.88; Hemoglobin 8.5; Platelets 460; Potassium 3.2; Sodium 137   Recent Lipid Panel    Component Value Date/Time   CHOL 226 (H) 01/04/2022 0550   TRIG 305 (H) 01/04/2022 0550   HDL 14 (L) 01/04/2022 0550   CHOLHDL 16.1 01/04/2022 0550   VLDL 61 (H) 01/04/2022 0550   LDLCALC 151 (H) 01/04/2022 0550     Risk Assessment/Calculations:    CHA2DS2-VASc Score = 2  This indicates a 2.2% annual risk of stroke. The patient's score is based upon: CHF History: 1 HTN History: 1 Diabetes History: 0 Stroke History: 0 Vascular Disease History: 0 Age Score: 0 Gender Score: 0   Physical Exam:    VS:  BP 98/60 (BP Location: Right Arm, Patient Position: Sitting, Cuff Size: Normal)    Pulse 100    Ht 5\' 3"  (1.6 m)    Wt 243 lb (110.2 kg)    SpO2 96%    BMI 43.05 kg/m     Wt Readings from Last 3 Encounters:   02/04/22 243 lb (110.2 kg)  01/29/22 249 lb (112.9 kg)  01/22/22 239 lb (108.4 kg)     GEN:  Well nourished, well developed in no acute distress HEENT: Normal NECK: No JVD; No carotid bruits CARDIAC: RRR, no murmurs, rubs, gallops RESPIRATORY:  Clear to auscultation without rales, wheezing or rhonchi  ABDOMEN: Soft, non-tender, non-distended MUSCULOSKELETAL:  Mild, non-pitting edema LLE. No edema RLE. No deformity. 2+ pedal pulses, equal bilaterally SKIN: Warm and dry NEUROLOGIC:  Alert and oriented x 3 PSYCHIATRIC:  Normal affect   EKG:  EKG is ordered today.  The ekg ordered today demonstrates sinus tachycardia at 100 bpm, LAD, LVH  Diagnoses:    1. Atrial fibrillation with RVR ([place])   2. Chronic systolic heart failure ([place])   3. Portal vein thrombosis   4. Other cardiomyopathy ([place])   5. Acute idiopathic gout of left hand   6. Microcytic anemia    Assessment and Plan:     Chronic combined CHF: LVEF 30-35%, G2DD, global hypokinesis, mildly to moderately dilated LV, normal RV, mod dilated LA by echo 01/06/22. Appears euvolemic on exam. Denies SOB, orthopnea, PND. Mild edema LLE. GDMT limited by hypotension. Plan for LHC in 1 week pending stable hgb as noted below.   Cardiomyopathy: Uncertain whether ICM or NICM. EF 30-35%, global hypokinesis by echo. Euvolemic on exam.  GDMT limited by hypotension. Needs ischemia evaluation as Plan for LHC in 1 week pending stable labs.  We will hold Eliquis 48 hours prior to cath.   PAF/NSVT on chronic anticoagulation: In sinus rhythm today, rate 100 bpm..Currently off amiodarone by mistake. With high risk for reoccurrence of arrhythmia, will resume amiodarone 200 mg once daily. Plan for this not to be long-term management due to his young age.  Plan for recheck of maintenance TSH, complete metabolic panel at next office visit. Continue Eliquis.   Portal vein thrombosis: Noted on CT during admission 1/6-1/15/23. On eliquis. Management per  vascular surgery.   Microcytic anemia: Felt to be consistent with anemia of chronic disease per IM. Hgb 8.5 on 1/31. Will recheck CBC today.   Gout: Acute gout flare  of left hand and left foot, treated in the ED 01/22/22.  Not on home therapy.  We will give him colchicine 0.6 mg once daily x 30 days with no refills.  Advised further management per PCP.  Hypokalemia: K+ 3.2 1/31. We will give him 40 mEq of potassium chloride for 3 days then 20 mEq daily.  We will recheck on 02/11/2022.   Disposition: LHC 1/13, 2 weeks post cath follow-up with Dr. Fletcher Anon or APP  Shared Decision Making/Informed Consent The risks [stroke (1 in 1000), death (1 in 70), kidney failure [usually temporary] (1 in 500), bleeding (1 in 200), allergic reaction [possibly serious] (1 in 200)], benefits (diagnostic support and management of coronary artery disease) and alternatives of a cardiac catheterization were discussed in detail with Steven Pham and he is willing to proceed.    Medication Adjustments/Labs and Tests Ordered: Current medicines are reviewed at length with the patient today.  Concerns regarding medicines are outlined above.  Orders Placed This Encounter  Procedures   CBC   Basic metabolic panel   EKG [id number]   Meds ordered this encounter  Medications   amiodarone (PACERONE) 200 MG tablet    Sig: Take 1 tablet (200 mg total) by mouth daily.    Dispense:  90 tablet    Refill:  0   potassium chloride SA (KLOR-CON M) 20 MEQ tablet    Sig: Take 1 tablet (20 mEq total) by mouth as directed. Take 1 tablet twice a day for 3 days after that you will take 1 tablet daily.    Dispense:  33 tablet    Refill:  3   colchicine 0.6 MG tablet    Sig: Take 1 tablet (0.6 mg total) by mouth daily.    Dispense:  30 tablet    Refill:  0    Order Specific Question:   Supervising Provider    Answer:   Thayer Headings [phone]    Patient Instructions  Medication Instructions:  Your physician has recommended you make  the following change in your medication:   START Potassium 20 mEq twice a day for 3 days THEN take once daily Amiodarone 200 mg once daily Colchicine 0.6 mg once daily. One month supply sent but you will need to follow up with your primary care provider for further refills.    *If you need a refill on your cardiac medications before your next appointment, please call your pharmacy*   Lab Work: CBC today.  BMET On Monday February 13th go to [facility] and check at registration. Let them know you are there for labs first then your heart cath.   If you have labs (blood work) drawn today and your tests are completely normal, you will receive your results only by: [place] (if you have MyChart) OR A paper copy in the mail If you have any lab test that is abnormal or we need to change your treatment, we will call you to review the results.   Testing/Procedures: [facility] Cardiac Cath Instructions  You are scheduled for a Cardiac Cath on: Monday February 13th Please arrive at 08:00 am on the day of your procedure Please expect a call from our [place] to pre-register you Do not eat/drink anything after midnight Someone will need to drive you home It is recommended someone be with you for the first 24 hours after your procedure Wear clothes that are easy to get on/off and wear slip on shoes if possible  Medications bring a current list of all medications with you  Instructions regarding medication:   _XX___ : Hold Eliquis on Saturday, Sunday, and morning of your procedure. _XX__ You may take all of your other medications the morning of your procedure with enough water to swallow safely   Day of your procedure: Arrive at the [place] entrance.  Free valet service is available.  After entering the [place] please check-in at the registration desk (1st desk on your right) to receive your armband. After receiving your armband someone will escort you to  the cardiac cath/special procedures waiting area.  The usual length of stay after your procedure is about 2 to 3 hours.  This can vary.  If you have any questions, please call our office at [phone], or you may call the cardiac cath lab at [facility] directly at [phone]    Follow-Up: At [facility], you and your health needs are our priority.  As part of our continuing mission to provide you with exceptional heart care, we have created designated Provider Care Teams.  These Care Teams include your primary Cardiologist (physician) and Advanced Practice Providers (APPs -  Physician Assistants and Nurse Practitioners) who all work together to provide you with the care you need, when you need it.  We recommend signing up for the patient portal called "MyChart".  Sign up information is provided on this After Visit Summary.  MyChart is used to connect with patients for Virtual Visits (Telemedicine).  Patients are able to view lab/test results, encounter notes, upcoming appointments, etc.  Non-urgent messages can be sent to your provider as well.   To learn more about what you can do with MyChart, go to [email or web address].    Your next appointment:   2 week(s) POST cath  The format for your next appointment:   In Person  Provider:   Kathlyn [place], MD or Christell Faith, PA-C     Signed, Aziah Kaiser, Lanice Schwab, NP  02/04/2022 11:55 AM    [place]

## 2022-02-04 ENCOUNTER — Encounter: Payer: Self-pay | Admitting: Nurse Practitioner

## 2022-02-04 ENCOUNTER — Encounter: Payer: Self-pay | Admitting: Physician Assistant

## 2022-02-04 ENCOUNTER — Other Ambulatory Visit: Payer: Self-pay

## 2022-02-04 ENCOUNTER — Ambulatory Visit (INDEPENDENT_AMBULATORY_CARE_PROVIDER_SITE_OTHER): Payer: BC Managed Care – PPO | Admitting: Nurse Practitioner

## 2022-02-04 VITALS — BP 98/60 | HR 100 | Ht 63.0 in | Wt 243.0 lb

## 2022-02-04 DIAGNOSIS — I5022 Chronic systolic (congestive) heart failure: Secondary | ICD-10-CM | POA: Diagnosis not present

## 2022-02-04 DIAGNOSIS — I428 Other cardiomyopathies: Secondary | ICD-10-CM | POA: Diagnosis not present

## 2022-02-04 DIAGNOSIS — I4891 Unspecified atrial fibrillation: Secondary | ICD-10-CM | POA: Diagnosis not present

## 2022-02-04 DIAGNOSIS — M10042 Idiopathic gout, left hand: Secondary | ICD-10-CM

## 2022-02-04 DIAGNOSIS — I81 Portal vein thrombosis: Secondary | ICD-10-CM | POA: Diagnosis not present

## 2022-02-04 DIAGNOSIS — E876 Hypokalemia: Secondary | ICD-10-CM

## 2022-02-04 DIAGNOSIS — D509 Iron deficiency anemia, unspecified: Secondary | ICD-10-CM

## 2022-02-04 MED ORDER — COLCHICINE 0.6 MG PO TABS: 0.6000 mg | ORAL_TABLET | Freq: Every day | ORAL | 0 refills | Status: DC

## 2022-02-04 MED ORDER — AMIODARONE HCL 200 MG PO TABS: 200.0000 mg | ORAL_TABLET | Freq: Every day | ORAL | 0 refills | Status: DC

## 2022-02-04 MED ORDER — POTASSIUM CHLORIDE CRYS ER 20 MEQ PO TBCR: 20.0000 meq | EXTENDED_RELEASE_TABLET | ORAL | 3 refills | Status: DC

## 2022-02-04 NOTE — Patient Instructions (Addendum)
Medication Instructions:  Your physician has recommended you make the following change in your medication:   START Potassium 20 mEq twice a day for 3 days THEN take once daily Amiodarone 200 mg once daily Colchicine 0.6 mg once daily. One month supply sent but you will need to follow up with your primary care provider for further refills.    *If you need a refill on your cardiac medications before your next appointment, please call your pharmacy*   Lab Work: CBC today.  BMET On Monday February 13th go to [facility] and check at registration. Let them know you are there for labs first then your heart cath.   If you have labs (blood work) drawn today and your tests are completely normal, you will receive your results only by: MyChart Message (if you have MyChart) OR A paper copy in the mail If you have any lab test that is abnormal or we need to change your treatment, we will call you to review the results.   Testing/Procedures: [facility] Cardiac Cath Instructions  You are scheduled for a Cardiac Cath on: Monday February 13th Please arrive at 08:00 am on the day of your procedure Please expect a call from our [facility] to pre-register you Do not eat/drink anything after midnight Someone will need to drive you home It is recommended someone be with you for the first 24 hours after your procedure Wear clothes that are easy to get on/off and wear slip on shoes if possible   Medications bring a current list of all medications with you  Instructions regarding medication:   _XX___ : Hold Eliquis on Saturday, Sunday, and morning of your procedure. _XX__ You may take all of your other medications the morning of your procedure with enough water to swallow safely   Day of your procedure: Arrive at the [facility] entrance.  Free valet service is available.  After entering the [facility] please check-in at the registration desk (1st desk on your right) to receive your  armband. After receiving your armband someone will escort you to the cardiac cath/special procedures waiting area.  The usual length of stay after your procedure is about 2 to 3 hours.  This can vary.  If you have any questions, please call our office at [phone], or you may call the cardiac cath lab at [facility] directly at [phone]    Follow-Up: At [facility], you and your health needs are our priority.  As part of our continuing mission to provide you with exceptional heart care, we have created designated Provider Care Teams.  These Care Teams include your primary Cardiologist (physician) and Advanced Practice Providers (APPs -  Physician Assistants and Nurse Practitioners) who all work together to provide you with the care you need, when you need it.  We recommend signing up for the patient portal called "MyChart".  Sign up information is provided on this After Visit Summary.  MyChart is used to connect with patients for Virtual Visits (Telemedicine).  Patients are able to view lab/test results, encounter notes, upcoming appointments, etc.  Non-urgent messages can be sent to your provider as well.   To learn more about what you can do with MyChart, go to [email or web address].    Your next appointment:   2 week(s) POST cath  The format for your next appointment:   In Person  Provider:   Lorine Bears, MD or Eula Listen, PA-C

## 2022-02-04 NOTE — Addendum Note (Signed)
Addended by: Levi Aland on: 02/04/2022 04:44 PM   Modules accepted: Orders

## 2022-02-05 ENCOUNTER — Telehealth: Payer: Self-pay | Admitting: *Deleted

## 2022-02-05 LAB — CBC
Hematocrit: 31.6 % — ABNORMAL LOW (ref 37.5–51.0)
Hemoglobin: 10.2 g/dL — ABNORMAL LOW (ref 13.0–17.7)
MCH: 25 pg — ABNORMAL LOW (ref 26.6–33.0)
MCHC: 32.3 g/dL (ref 31.5–35.7)
MCV: 78 fL — ABNORMAL LOW (ref 79–97)
Platelets: 358 10*3/uL (ref 150–450)
RBC: 4.08 x10E6/uL — ABNORMAL LOW (ref 4.14–5.80)
RDW: 19.9 % — ABNORMAL HIGH (ref 11.6–15.4)
WBC: 14 10*3/uL — ABNORMAL HIGH (ref 3.4–10.8)

## 2022-02-05 NOTE — Addendum Note (Signed)
Encounter addended by: Rosalita Chessman, RN on: 02/05/2022 3:17 PM  Actions taken: Charge Capture section accepted

## 2022-02-05 NOTE — Telephone Encounter (Signed)
No answer/No voicemail box set up.  

## 2022-02-05 NOTE — Telephone Encounter (Signed)
-----   Message from Emmaline Life, NP sent at 02/05/2022  7:55 AM EST ----- His blood counts have improved and are stable for upcoming cardiac cath. His elevated WBC may be due to recent prednisone therapy. I will forward to infectious disease who have managed his recent bacteremia.

## 2022-02-06 ENCOUNTER — Telehealth: Payer: Self-pay | Admitting: Cardiovascular Disease

## 2022-02-06 NOTE — Telephone Encounter (Signed)
Please see phone encounter dated 2/7.

## 2022-02-06 NOTE — Telephone Encounter (Signed)
°  Patient called answering service and stated he was returning a call from our office.

## 2022-02-06 NOTE — Telephone Encounter (Signed)
Patient returned call. Results reviewed with patient, questions, if any, answered.

## 2022-02-06 NOTE — Telephone Encounter (Signed)
No answer, no voicemail set up.

## 2022-02-11 ENCOUNTER — Encounter
Admission: RE | Disposition: A | Discharge status: Home / Self Care | Payer: Self-pay | Source: Ambulatory Visit | Attending: Cardiovascular Disease

## 2022-02-11 ENCOUNTER — Encounter: Payer: Self-pay | Admitting: Cardiovascular Disease

## 2022-02-11 ENCOUNTER — Ambulatory Visit
Admission: RE | Admit: 2022-02-11 | Discharge: 2022-02-11 | Disposition: A | Discharge status: Home / Self Care | Payer: BC Managed Care – PPO | Source: Ambulatory Visit | Attending: Cardiovascular Disease | Admitting: Cardiovascular Disease

## 2022-02-11 DIAGNOSIS — I420 Dilated cardiomyopathy: Secondary | ICD-10-CM | POA: Diagnosis not present

## 2022-02-11 DIAGNOSIS — I4891 Unspecified atrial fibrillation: Secondary | ICD-10-CM

## 2022-02-11 DIAGNOSIS — I5022 Chronic systolic (congestive) heart failure: Secondary | ICD-10-CM | POA: Diagnosis not present

## 2022-02-11 DIAGNOSIS — M10042 Idiopathic gout, left hand: Secondary | ICD-10-CM | POA: Insufficient documentation

## 2022-02-11 DIAGNOSIS — D509 Iron deficiency anemia, unspecified: Secondary | ICD-10-CM | POA: Insufficient documentation

## 2022-02-11 DIAGNOSIS — R931 Abnormal findings on diagnostic imaging of heart and coronary circulation: Secondary | ICD-10-CM

## 2022-02-11 DIAGNOSIS — I480 Paroxysmal atrial fibrillation: Secondary | ICD-10-CM | POA: Diagnosis not present

## 2022-02-11 DIAGNOSIS — E876 Hypokalemia: Secondary | ICD-10-CM

## 2022-02-11 DIAGNOSIS — I428 Other cardiomyopathies: Secondary | ICD-10-CM

## 2022-02-11 DIAGNOSIS — I472 Ventricular tachycardia, unspecified: Secondary | ICD-10-CM | POA: Diagnosis not present

## 2022-02-11 DIAGNOSIS — I81 Portal vein thrombosis: Secondary | ICD-10-CM | POA: Insufficient documentation

## 2022-02-11 DIAGNOSIS — Z7901 Long term (current) use of anticoagulants: Secondary | ICD-10-CM | POA: Insufficient documentation

## 2022-02-11 DIAGNOSIS — I110 Hypertensive heart disease with heart failure: Secondary | ICD-10-CM | POA: Diagnosis not present

## 2022-02-11 HISTORY — PX: RIGHT/LEFT HEART CATH AND CORONARY ANGIOGRAPHY: CATH118266

## 2022-02-11 SURGERY — RIGHT/LEFT HEART CATH AND CORONARY ANGIOGRAPHY
Anesthesia: Moderate Sedation

## 2022-02-11 MED ORDER — SODIUM CHLORIDE 0.9 % IV SOLN: INTRAVENOUS | Status: DC

## 2022-02-11 MED ORDER — IOHEXOL 300 MG/ML  SOLN
INTRAMUSCULAR | Status: DC | PRN
  Administered 2022-02-11: 49 mL

## 2022-02-11 MED ORDER — HEPARIN (PORCINE) IN NACL 1000-0.9 UT/500ML-% IV SOLN
INTRAVENOUS | Status: AC
  Filled 2022-02-11: qty 1000

## 2022-02-11 MED ORDER — ONDANSETRON HCL 4 MG/2ML IJ SOLN: 4.0000 mg | Freq: Four times a day (QID) | INTRAMUSCULAR | Status: DC | PRN

## 2022-02-11 MED ORDER — SODIUM CHLORIDE 0.9% FLUSH: 3.0000 mL | Freq: Two times a day (BID) | INTRAVENOUS | Status: DC

## 2022-02-11 MED ORDER — VERAPAMIL HCL 2.5 MG/ML IV SOLN
INTRAVENOUS | Status: AC
  Filled 2022-02-11: qty 2

## 2022-02-11 MED ORDER — HEPARIN SODIUM (PORCINE) 1000 UNIT/ML IJ SOLN
INTRAMUSCULAR | Status: AC
  Filled 2022-02-11: qty 10

## 2022-02-11 MED ORDER — ASPIRIN 81 MG PO CHEW: 81.0000 mg | CHEWABLE_TABLET | ORAL | Status: AC

## 2022-02-11 MED ORDER — HEPARIN SODIUM (PORCINE) 1000 UNIT/ML IJ SOLN
INTRAMUSCULAR | Status: DC | PRN
  Administered 2022-02-11: 5000 [IU] via INTRAVENOUS

## 2022-02-11 MED ORDER — FENTANYL CITRATE (PF) 100 MCG/2ML IJ SOLN
INTRAMUSCULAR | Status: DC | PRN
  Administered 2022-02-11: 50 ug via INTRAVENOUS

## 2022-02-11 MED ORDER — ASPIRIN 81 MG PO CHEW
CHEWABLE_TABLET | ORAL | Status: AC
  Administered 2022-02-11: 81 mg via ORAL
  Filled 2022-02-11: qty 1

## 2022-02-11 MED ORDER — ACETAMINOPHEN 325 MG PO TABS: 650.0000 mg | ORAL_TABLET | ORAL | Status: DC | PRN

## 2022-02-11 MED ORDER — MIDAZOLAM HCL 2 MG/2ML IJ SOLN
INTRAMUSCULAR | Status: AC
  Filled 2022-02-11: qty 2

## 2022-02-11 MED ORDER — SODIUM CHLORIDE 0.9% FLUSH: 3.0000 mL | INTRAVENOUS | Status: DC | PRN

## 2022-02-11 MED ORDER — CARVEDILOL 3.125 MG PO TABS: 3.1250 mg | ORAL_TABLET | Freq: Two times a day (BID) | ORAL | 3 refills | Status: DC

## 2022-02-11 MED ORDER — MIDAZOLAM HCL 2 MG/2ML IJ SOLN
INTRAMUSCULAR | Status: DC | PRN
  Administered 2022-02-11 (×2): 1 mg via INTRAVENOUS

## 2022-02-11 MED ORDER — VERAPAMIL HCL 2.5 MG/ML IV SOLN
INTRAVENOUS | Status: DC | PRN
  Administered 2022-02-11: 2.5 mg via INTRA_ARTERIAL

## 2022-02-11 MED ORDER — FENTANYL CITRATE (PF) 100 MCG/2ML IJ SOLN
INTRAMUSCULAR | Status: AC
  Filled 2022-02-11: qty 2

## 2022-02-11 MED ORDER — SODIUM CHLORIDE 0.9 % IV SOLN
250.0000 mL | INTRAVENOUS | Status: DC | PRN
  Administered 2022-02-11: 1000 mL via INTRAVENOUS

## 2022-02-11 MED ORDER — SODIUM CHLORIDE 0.9 % IV SOLN: 250.0000 mL | INTRAVENOUS | Status: DC | PRN

## 2022-02-11 MED ORDER — HEPARIN (PORCINE) IN NACL 1000-0.9 UT/500ML-% IV SOLN
INTRAVENOUS | Status: DC | PRN
  Administered 2022-02-11: 1000 mL

## 2022-02-11 SURGICAL SUPPLY — 13 items
CATH BALLN WEDGE 5F 110CM (CATHETERS) ×1 IMPLANT
CATH INFINITI 5FR JK (CATHETERS) ×1 IMPLANT
CATH INFINITI JR4 5F (CATHETERS) ×1 IMPLANT
DEVICE RAD TR BAND REGULAR (VASCULAR PRODUCTS) ×1 IMPLANT
DRAPE BRACHIAL (DRAPES) ×2 IMPLANT
GLIDESHEATH SLEND SS 6F .021 (SHEATH) ×1 IMPLANT
GUIDEWIRE INQWIRE 1.5J.035X260 (WIRE) IMPLANT
INQWIRE 1.5J .035X260CM (WIRE) ×2
PACK CARDIAC CATH (CUSTOM PROCEDURE TRAY) ×2 IMPLANT
PROTECTION STATION PRESSURIZED (MISCELLANEOUS) ×2
SET ATX SIMPLICITY (MISCELLANEOUS) ×1 IMPLANT
SHEATH GLIDE SLENDER 4/5FR (SHEATH) ×1 IMPLANT
STATION PROTECTION PRESSURIZED (MISCELLANEOUS) IMPLANT

## 2022-02-11 NOTE — Interval H&P Note (Signed)
History and Physical Interval Note:  02/11/2022 10:15 AM  Steven Pham  has presented today for surgery, with the diagnosis of LT Cath   Decreased EF.  The various methods of treatment have been discussed with the patient and family. After consideration of risks, benefits and other options for treatment, the patient has consented to  Procedure(s): RIGHT/LEFT HEART CATH AND CORONARY ANGIOGRAPHY (N/A) as a surgical intervention.  The patient's history has been reviewed, patient examined, no change in status, stable for surgery.  I have reviewed the patient's chart and labs.  Questions were answered to the patient's satisfaction.     Kathlyn Sacramento

## 2022-02-13 ENCOUNTER — Encounter: Payer: Self-pay | Admitting: Cardiovascular Disease

## 2022-02-26 ENCOUNTER — Encounter: Payer: Self-pay | Admitting: Cardiovascular Disease

## 2022-02-26 ENCOUNTER — Other Ambulatory Visit: Payer: Self-pay

## 2022-02-26 ENCOUNTER — Ambulatory Visit (INDEPENDENT_AMBULATORY_CARE_PROVIDER_SITE_OTHER): Payer: BC Managed Care – PPO | Admitting: Cardiovascular Disease

## 2022-02-26 VITALS — BP 122/72 | HR 70 | Ht 64.0 in | Wt 242.4 lb

## 2022-02-26 DIAGNOSIS — I5022 Chronic systolic (congestive) heart failure: Secondary | ICD-10-CM | POA: Diagnosis not present

## 2022-02-26 DIAGNOSIS — I81 Portal vein thrombosis: Secondary | ICD-10-CM

## 2022-02-26 DIAGNOSIS — I4819 Other persistent atrial fibrillation: Secondary | ICD-10-CM | POA: Diagnosis not present

## 2022-02-26 MED ORDER — ENTRESTO 24-26 MG PO TABS: 1.0000 | ORAL_TABLET | Freq: Two times a day (BID) | ORAL | 2 refills | Status: DC

## 2022-02-26 NOTE — Progress Notes (Signed)
Cardiology Office Note   Date:  02/26/2022   ID:  Steven Pham, DOB September 05, 1961, MRN [id number]  PCP:  [facility]  Cardiologist:   Lorine Bears, MD   Chief Complaint  Patient presents with   OTHER    2 wk f/u post cath pt would like to discuss his medications.Meds reviewed verbally with pt.   Labs Only      History of Present Illness: Steven Pham is a 61 y.o. male who presents for a follow-up visit regarding chronic systolic heart failure and atrial fibrillation.  He has known history of essential hypertension, obesity and obstructive sleep apnea.  He was hospitalized in December with diverticulitis complicated by acute renal failure.  He was noted to have intermittent tachycardia and thus had an echocardiogram done which showed an EF of 20 to 25%, grade 3 diastolic dysfunction and mild to moderate mitral regurgitation.  We switched him from metoprolol to carvedilol and added small dose losartan and spironolactone.  I He was rehospitalized at the end of December with portal vein thrombosis thought to be due to increased inflammation related to his diverticulitis.  He was hypotensive on presentation.  He was anticoagulated and ultimately discharged on Eliquis.  Due to hypotension and acute kidney injury, amlodipine, furosemide and losartan were held.   He was subsequently diagnosed with atrial fibrillation with rapid ventricular response complicated by hypotension.  He was hospitalized for that reason and was anticoagulated.  He converted to sinus rhythm with IV amiodarone.  Cardiac catheterization was planned but could not be done due to bacteremia.  Echocardiogram showed an EF of 30 to 35%. I proceeded with a right and left cardiac catheterization recently which showed normal coronary arteries, moderately reduced LV systolic function with an EF of 35 to 40%.  Right heart catheterization showed normal filling pressures, normal pulmonary pressures and normal cardiac  output.  I added small dose carvedilol after that.  He has been doing well overall with improved shortness of breath.  No chest pain.  No significant leg edema.  He resumed working at [employer] this week.   Past Medical History:  Diagnosis Date   Arthritis    Cardiomyopathy (HCC)    a. 11/2021 Echo: EF 20-25%, glob HK. GrIII DD. Nl RV size/fxn. Mildly elev PASP. Sev dil LA, mildly dil RA. Mild to mod MR.   Gout    Hypertension    NSVT (nonsustained ventricular tachycardia) 11/2021   PSVT (paroxysmal supraventricular tachycardia) (HCC) 11/2021    Past Surgical History:  Procedure Laterality Date   RIGHT/LEFT HEART CATH AND CORONARY ANGIOGRAPHY N/A 02/11/2022   Procedure: RIGHT/LEFT HEART CATH AND CORONARY ANGIOGRAPHY;  Surgeon: Iran Ouch, MD;  Location: [facility] INVASIVE CV LAB;  Service: Cardiovascular;  Laterality: N/A;     Current Outpatient Medications  Medication Sig Dispense Refill   acetaminophen (TYLENOL) 650 MG CR tablet Take 650 mg by mouth every 8 (eight) hours as needed for pain.     amiodarone (PACERONE) 200 MG tablet Take 1 tablet (200 mg total) by mouth daily. 90 tablet 0   apixaban (ELIQUIS) 5 MG TABS tablet Take 1 tablet (5 mg total) by mouth 2 (two) times daily. 60 tablet 0   carvedilol (COREG) 3.125 MG tablet Take 1 tablet (3.125 mg total) by mouth 2 (two) times daily. 60 tablet 3   colchicine 0.6 MG tablet Take 1 tablet (0.6 mg total) by mouth daily. 30 tablet 0   famotidine (PEPCID) 20  MG tablet Take 1 tablet (20 mg total) by mouth 2 (two) times daily. 60 tablet 0   potassium chloride SA (KLOR-CON M) 20 MEQ tablet Take 1 tablet (20 mEq total) by mouth as directed. Take 1 tablet twice a day for 3 days after that you will take 1 tablet daily. (Patient taking differently: Take 20 mEq by mouth daily.) 33 tablet 3   No current facility-administered medications for this visit.    Allergies:   Patient has no known allergies.    Social History:  The patient   reports that he has never smoked. He has never used smokeless tobacco. He reports that he does not drink alcohol and does not use drugs.   Family History:  The patient's family history includes Coronary artery disease in his mother.    ROS:  Please see the history of present illness.   Otherwise, review of systems are positive for none.   All other systems are reviewed and negative.    PHYSICAL EXAM: VS:  BP 122/72 (BP Location: Left Arm, Patient Position: Sitting, Cuff Size: Normal)    Pulse 70    Ht 5\' 4"  (1.626 m)    Wt 242 lb 6 oz (109.9 kg)    SpO2 98%    BMI 41.60 kg/m  , BMI Body mass index is 41.6 kg/m. GEN: Well nourished, well developed, in no acute distress  HEENT: normal  Neck: no JVD, carotid bruits, or masses Cardiac: Regular rate and rhythm,  no murmurs, rubs, or gallops,no edema  Respiratory:  clear to auscultation bilaterally, normal work of breathing GI: soft, nontender, nondistended, + BS MS: no deformity or atrophy  Skin: warm and dry, no rash Neuro:  Strength and sensation are intact Psych: euthymic mood, full affect Right radial pulses normal with no hematoma.  EKG:  EKG is ordered today. The ekg ordered today demonstrates : Normal sinus rhythm with moderate LVH with repolarization abnormalities.    Recent Labs: 12/15/2021: TSH 0.822 01/08/2022: Magnesium 1.9 01/11/2022: B Natriuretic Peptide 341.9 01/29/2022: ALT 22; BUN 12; Creatinine, Ser 0.88; Potassium 3.2; Sodium 137 02/04/2022: Hemoglobin 10.2; Platelets 358    Lipid Panel    Component Value Date/Time   CHOL 226 (H) 01/04/2022 0550   TRIG 305 (H) 01/04/2022 0550   HDL 14 (L) 01/04/2022 0550   CHOLHDL 16.1 01/04/2022 0550   VLDL 61 (H) 01/04/2022 0550   LDLCALC 151 (H) 01/04/2022 0550      Wt Readings from Last 3 Encounters:  02/26/22 242 lb 6 oz (109.9 kg)  02/04/22 243 lb (110.2 kg)  01/29/22 249 lb (112.9 kg)       No flowsheet data found.    ASSESSMENT AND PLAN:  1.  Persistent  atrial fibrillation: This was in the setting of bacteremia, diverticulitis and portal vein thrombosis.  He is maintaining sinus rhythm with amiodarone 200 mg once daily.  I am not planning to use amiodarone long-term but we will keep him on it over the next 2 months.  Continue anticoagulation with Eliquis.  2.  Chronic systolic heart failure: This is due to nonischemic cardiomyopathy.  Initial ejection fraction was 20 to 25% subsequently improved to 35 to 40% on recent cardiac catheterization.  He appears to be euvolemic without any diuretics.  Continue carvedilol.  I elected to add small dose Entresto.  If he is not able to afford the medication, I will consider adding valsartan instead.  3.  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  4.  Gout: He uses colchicine as needed.   Disposition: Check basic metabolic profile in 1 week and follow-up in 1 month for heart failure medication up titration.  Signed,  Kathlyn Sacramento, MD  02/26/2022 3:47 PM    [place]

## 2022-02-26 NOTE — Patient Instructions (Signed)
Medication Instructions:  Your physician has recommended you make the following change in your medication:   - STOP Potassium  - START Entresto 24/26 mg 1 tablet  twice a day. An Rx has been sent to your pharmacy.  *If you need a refill on your cardiac medications before your next appointment, please call your pharmacy*   Lab Work: Your physician recommends that you return for lab work (bmp) in: 1 week  If you have labs (blood work) drawn today and your tests are completely normal, you will receive your results only by: MyChart Message (if you have MyChart) OR A paper copy in the mail If you have any lab test that is abnormal or we need to change your treatment, we will call you to review the results.   Testing/Procedures: None ordered   Follow-Up: At [facility], you and your health needs are our priority.  As part of our continuing mission to provide you with exceptional heart care, we have created designated Provider Care Teams.  These Care Teams include your primary Cardiologist (physician) and Advanced Practice Providers (APPs -  Physician Assistants and Nurse Practitioners) who all work together to provide you with the care you need, when you need it.  We recommend signing up for the patient portal called "MyChart".  Sign up information is provided on this After Visit Summary.  MyChart is used to connect with patients for Virtual Visits (Telemedicine).  Patients are able to view lab/test results, encounter notes, upcoming appointments, etc.  Non-urgent messages can be sent to your provider as well.   To learn more about what you can do with MyChart, go to [email or web address].    Your next appointment:   4 month(s)  The format for your next appointment:   In Person  Provider:   You may see Lorine Bears, MD or one of the following Advanced Practice Providers on your designated Care Team:   Nicolasa Ducking, NP Eula Listen, PA-C Cadence Fransico Michael, PA-C{    Other  Instructions N/A

## 2022-03-17 ENCOUNTER — Other Ambulatory Visit: Payer: Self-pay | Admitting: Nurse Practitioner

## 2022-03-18 ENCOUNTER — Other Ambulatory Visit: Payer: Self-pay

## 2022-03-18 DIAGNOSIS — I5022 Chronic systolic (congestive) heart failure: Secondary | ICD-10-CM

## 2022-03-18 MED ORDER — ENTRESTO 24-26 MG PO TABS: 1.0000 | ORAL_TABLET | Freq: Two times a day (BID) | ORAL | 2 refills | Status: DC

## 2022-03-18 NOTE — Telephone Encounter (Signed)
*  STAT* If patient is at the pharmacy, call can be transferred to refill team.   1. Which medications need to be refilled? (please list name of each medication and dose if known) Entresto  2. Which pharmacy/location (including street and city if local pharmacy) is medication to be sent to? [facility]   3. Do they need a 30 day or 90 day supply? 30

## 2022-03-20 ENCOUNTER — Other Ambulatory Visit: Payer: Self-pay

## 2022-03-20 MED ORDER — APIXABAN 5 MG PO TABS: 5.0000 mg | ORAL_TABLET | Freq: Two times a day (BID) | ORAL | 0 refills | Status: DC

## 2022-03-20 MED ORDER — AMIODARONE HCL 200 MG PO TABS: 200.0000 mg | ORAL_TABLET | Freq: Every day | ORAL | 2 refills | Status: DC

## 2022-03-20 NOTE — Telephone Encounter (Signed)
Prescription refill request for Eliquis received. ?Indication: Afib  ?Last office visit: 02/26/22 Kirke Corin)  ?Scr: 0.88 (01/29/22) ?Age: 61 ?Weight: 109.9kg ? ?Appropriate dose and refill sent to requested pharmacy.  ?

## 2022-03-20 NOTE — Telephone Encounter (Signed)
*  STAT* If patient is at the pharmacy, call can be transferred to refill team.   1. Which medications need to be refilled? (please list name of each medication and dose if known) Eliquis, Amiodarone  2. Which pharmacy/location (including street and city if local pharmacy) is medication to be sent to?[place]  3. Do they need a 30 day or 90 day supply? [place]

## 2022-03-20 NOTE — Telephone Encounter (Signed)
Refill request

## 2022-05-10 ENCOUNTER — Other Ambulatory Visit: Payer: Self-pay | Admitting: Cardiovascular Disease

## 2022-05-10 DIAGNOSIS — I4819 Other persistent atrial fibrillation: Secondary | ICD-10-CM

## 2022-05-10 DIAGNOSIS — I4891 Unspecified atrial fibrillation: Secondary | ICD-10-CM

## 2022-05-10 NOTE — Telephone Encounter (Signed)
Eliquis 5mg  refill request received. Patient is 61 years old, weight-109.9kg, Crea-0.88 on 01/29/2022, Diagnosis-Afib, and last seen by Dr. Fletcher Anon on 02/26/2022. Dose is appropriate based on dosing criteria. Will send in refill to requested pharmacy.   ?

## 2022-05-10 NOTE — Telephone Encounter (Signed)
Refill Request.  

## 2022-06-11 ENCOUNTER — Ambulatory Visit: Payer: BC Managed Care – PPO | Admitting: Cardiovascular Disease

## 2022-06-12 ENCOUNTER — Encounter: Payer: Self-pay | Admitting: Cardiovascular Disease

## 2022-08-27 DIAGNOSIS — R609 Edema, unspecified: Secondary | ICD-10-CM | POA: Diagnosis not present

## 2022-11-12 DIAGNOSIS — Z0189 Encounter for other specified special examinations: Secondary | ICD-10-CM | POA: Diagnosis not present

## 2022-11-12 DIAGNOSIS — Z0131 Encounter for examination of blood pressure with abnormal findings: Secondary | ICD-10-CM | POA: Diagnosis not present

## 2022-11-13 ENCOUNTER — Telehealth: Payer: Self-pay | Admitting: Cardiovascular Disease

## 2022-11-13 NOTE — Telephone Encounter (Signed)
Patient returned call. In review patients medications, he is not taking his amiodarone, eliquis, or entresto. States he "does not want to take all these heart medications, he feels fine" also states that he cannot afford his Carvedilol and wants another option.   Discussed with patient how important these medications and follow up appointments are to manage his cardiac health. Informed patient he will need to be seen as he is overdue for him follow up and medication management. Scheduled him to be seen next week with Dr. Kirke Corin. Patient is only able to come after 3:30 because of work. Used a same day apt for Dr. Kirke Corin on 11/19/22 at 4:40.

## 2022-11-13 NOTE — Telephone Encounter (Signed)
Pt c/o medication issue:  1. Name of Medication:   carvedilol (COREG) 3.125 MG tablet    2. How are you currently taking this medication (dosage and times per day)?   Take 1 tablet (3.125 mg total) by mouth 2 (two) times daily.    3. Are you having a reaction (difficulty breathing--STAT)? No  4. What is your medication issue? Pt would like to know if medication can be changed due to the cost. Pt would like a callback regarding this matter. Please advise

## 2022-11-13 NOTE — Telephone Encounter (Signed)
Called patient back. He has a "VM box that has not been set up yet".   Patient was last seen in office by Dr. Kirke Corin on 02/26/22. He no-showed his follow up.   ASSESSMENT AND PLAN:   1.  Persistent atrial fibrillation: This was in the setting of bacteremia, diverticulitis and portal vein thrombosis.  He is maintaining sinus rhythm with amiodarone 200 mg once daily.  I am not planning to use amiodarone long-term but we will keep him on it over the next 2 months.  Continue anticoagulation with Eliquis.   2.  Chronic systolic heart failure: This is due to nonischemic cardiomyopathy.  Initial ejection fraction was 20 to 25% subsequently improved to 35 to 40% on recent cardiac catheterization.  He appears to be euvolemic without any diuretics.  Continue carvedilol.  I elected to add small dose Entresto.  If he is not able to afford the medication, I will consider adding valsartan instead.   3.  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   4.  Gout: He uses colchicine as needed.     Disposition: Check basic metabolic profile in 1 week and follow-up in 1 month for heart failure medication up titration.

## 2022-11-18 ENCOUNTER — Encounter: Payer: Self-pay | Admitting: *Deleted

## 2022-11-19 ENCOUNTER — Ambulatory Visit: Payer: BC Managed Care – PPO | Attending: Cardiovascular Disease | Admitting: Cardiovascular Disease

## 2022-11-19 ENCOUNTER — Encounter: Payer: Self-pay | Admitting: Cardiovascular Disease

## 2022-11-19 VITALS — BP 130/100 | HR 161 | Ht 63.0 in | Wt 271.0 lb

## 2022-11-19 DIAGNOSIS — I5022 Chronic systolic (congestive) heart failure: Secondary | ICD-10-CM

## 2022-11-19 DIAGNOSIS — I4819 Other persistent atrial fibrillation: Secondary | ICD-10-CM

## 2022-11-19 MED ORDER — METOPROLOL TARTRATE 25 MG PO TABS: 25.0000 mg | ORAL_TABLET | Freq: Two times a day (BID) | ORAL | 1 refills | Status: DC

## 2022-11-19 MED ORDER — AMIODARONE HCL 200 MG PO TABS: 200.0000 mg | ORAL_TABLET | Freq: Two times a day (BID) | ORAL | 1 refills | Status: DC

## 2022-11-19 NOTE — Progress Notes (Unsigned)
Cardiology Office Note   Date:  11/19/2022   ID:  Steven Pham, DOB Mar 17, 1961, MRN [id number]  PCP:  [facility]  Cardiologist:   Lorine Bears, MD   Chief Complaint  Patient presents with   Other    OD F/u d/c amiodarone x3 months, d/c carvedilol and entresto 24/26  due to feeling dizzy/swimmy headed at work x1 month. Meds reviewed verbally with pt.      History of Present Illness: Steven Pham is a 61 y.o. male who presents for a follow-up visit regarding chronic systolic heart failure and atrial fibrillation.  He has known history of essential hypertension, obesity and obstructive sleep apnea.  He was hospitalized in December of 2022 with diverticulitis complicated by acute renal failure.  He was noted to have intermittent tachycardia and thus had an echocardiogram done which showed an EF of 20 to 25%, grade 3 diastolic dysfunction and mild to moderate mitral regurgitation.  We switched him from metoprolol to carvedilol and added small dose losartan and spironolactone.   He was rehospitalized at the end of December with portal vein thrombosis thought to be due to increased inflammation related to his diverticulitis.  He was hypotensive on presentation.  He was anticoagulated and ultimately discharged on Eliquis.  Due to hypotension and acute kidney injury, amlodipine, furosemide and losartan were held.   He was subsequently diagnosed with atrial fibrillation with rapid ventricular response complicated by hypotension.  He was hospitalized for that reason and was anticoagulated.  He converted to sinus rhythm with IV amiodarone.  Cardiac catheterization was planned but could not be done due to bacteremia.  Echocardiogram showed an EF of 30 to 35%. Coronary angiography done in February 2023 showed normal coronary arteries, moderately reduced LV systolic function with an EF of 35 to 40%.  Right heart catheterization showed normal filling pressures, normal pulmonary  pressures and normal cardiac output.  I added small dose carvedilol after that.   He was subsequently seen in the office in February and I added Entresto at that time.  He was supposed to follow-up shortly after that for up titration of medications but has not been seen since then. He felt dizzy after he started taking Entresto and he decided to stop Entresto, Coreg and amiodarone.  He continues to take Eliquis.  He goes to [facility] but has not been seen there in few months. He returns for follow-up with me and he is noted to be tachycardic with a heart rate of 160 bpm.  He does not know how long he has been tachycardic but he felt palpitations for few months now.  In spite of this, he denies chest pain or shortness of breath.  He gained significant amount of weight since February but he reports that his appetite has improved since his diverticulitis was treated.  No chest pain.    Past Medical History:  Diagnosis Date   Arthritis    Cardiomyopathy (HCC)    a. 11/2021 Echo: EF 20-25%, glob HK. GrIII DD. Nl RV size/fxn. Mildly elev PASP. Sev dil LA, mildly dil RA. Mild to mod MR.   Gout    Hypertension    NSVT (nonsustained ventricular tachycardia) (HCC) 11/2021   PSVT (paroxysmal supraventricular tachycardia) 11/2021    Past Surgical History:  Procedure Laterality Date   RIGHT/LEFT HEART CATH AND CORONARY ANGIOGRAPHY N/A 02/11/2022   Procedure: RIGHT/LEFT HEART CATH AND CORONARY ANGIOGRAPHY;  Surgeon: Iran Ouch, MD;  Location: [facility] INVASIVE CV LAB;  Service: Cardiovascular;  Laterality: N/A;     Current Outpatient Medications  Medication Sig Dispense Refill   acetaminophen (TYLENOL) 650 MG CR tablet Take 650 mg by mouth every 8 (eight) hours as needed for pain.     amiodarone (PACERONE) 200 MG tablet Take 1 tablet (200 mg total) by mouth daily. 30 tablet 2   apixaban (ELIQUIS) 5 MG TABS tablet Take 1 tablet (5 mg total) by mouth 2 (two) times daily. 180 tablet 1    colchicine 0.6 MG tablet Take 1 tablet (0.6 mg total) by mouth daily. 90 tablet 3   carvedilol (COREG) 3.125 MG tablet Take 1 tablet (3.125 mg total) by mouth 2 (two) times daily. (Patient not taking: Reported on 11/19/2022) 60 tablet 3   famotidine (PEPCID) 20 MG tablet Take 1 tablet (20 mg total) by mouth 2 (two) times daily. (Patient not taking: Reported on 11/19/2022) 60 tablet 0   sacubitril-valsartan (ENTRESTO) 24-26 MG Take 1 tablet by mouth 2 (two) times daily. (Patient not taking: Reported on 11/19/2022) 60 tablet 2   No current facility-administered medications for this visit.    Allergies:   Patient has no known allergies.    Social History:  The patient  reports that he has never smoked. He has never used smokeless tobacco. He reports that he does not drink alcohol and does not use drugs.   Family History:  The patient's family history includes Coronary artery disease in his mother.    ROS:  Please see the history of present illness.   Otherwise, review of systems are positive for none.   All other systems are reviewed and negative.    PHYSICAL EXAM: VS:  BP (!) 130/100 (BP Location: Left Arm, Patient Position: Sitting, Cuff Size: Large)   Pulse (!) 161   Ht 5\' 3"  (1.6 m)   Wt 271 lb (122.9 kg)   SpO2 97%   BMI 48.01 kg/m  , BMI Body mass index is 48.01 kg/m. GEN: Well nourished, well developed, in no acute distress  HEENT: normal  Neck: no JVD, carotid bruits, or masses Cardiac: Regular and tachycardic,  no murmurs, rubs, or gallops,no edema  Respiratory:  clear to auscultation bilaterally, normal work of breathing GI: soft, nontender, nondistended, + BS MS: no deformity or atrophy  Skin: warm and dry, no rash Neuro:  Strength and sensation are intact Psych: euthymic mood, full affect Right radial pulses normal with no hematoma.  EKG:  EKG is ordered today. The ekg ordered today demonstrates : Atrial flutter versus coarse atrial fibrillation with a heart rate of  161 bpm.  LVH with repolarization abnormalities.    Recent Labs: 12/15/2021: TSH 0.822 01/08/2022: Magnesium 1.9 01/11/2022: B Natriuretic Peptide 341.9 01/29/2022: ALT 22; BUN 12; Creatinine, Ser 0.88; Potassium 3.2; Sodium 137 02/04/2022: Hemoglobin 10.2; Platelets 358    Lipid Panel    Component Value Date/Time   CHOL 226 (H) 01/04/2022 0550   TRIG 305 (H) 01/04/2022 0550   HDL 14 (L) 01/04/2022 0550   CHOLHDL 16.1 01/04/2022 0550   VLDL 61 (H) 01/04/2022 0550   LDLCALC 151 (H) 01/04/2022 0550      Wt Readings from Last 3 Encounters:  11/19/22 271 lb (122.9 kg)  02/26/22 242 lb 6 oz (109.9 kg)  02/04/22 243 lb (110.2 kg)           No data to display            ASSESSMENT AND PLAN:  1.  Persistent atrial fibrillation/flutter:  He is back in atrial fibrillation with a heart rate of 161 bpm.  Unfortunately, he stopped taking his cardiac medications on his own with the exception of Eliquis.  He reports compliance with Eliquis and has not missed any doses.  I had a prolonged discussion with him about the importance of compliance and the implications of continued tachycardia in the setting of cardiomyopathy.  I discussed with him the high risk for decompensated heart failure and death. I resumed amiodarone at 200 mg twice daily and added metoprolol tartrate 25 mg twice daily.  Continue Eliquis and schedule cardioversion on Friday.  I discussed the procedure in details as well as risks and benefits.  2.  Chronic systolic heart failure: This is due to nonischemic cardiomyopathy.  Initial ejection fraction was 20 to 25% subsequently improved to 35 to 40% on recent cardiac catheterization.  He stopped eating Entresto and carvedilol due to dizziness.  The plan is to repeat echocardiogram after restoration of sinus rhythm and restart his heart failure medications gradually.  He reports poor tolerance.  3.  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  4.  Gout: He uses colchicine as needed.   Disposition: Start metoprolol and resume amiodarone.  Continue Eliquis and proceed with cardioversion on Friday.  I asked him to go to the ED if he becomes symptomatic.   Signed,  Lorine Bears, MD  11/19/2022 5:06 PM    [facility]

## 2022-11-19 NOTE — H&P (View-Only) (Signed)
  Cardiology Office Note   Date:  11/19/2022   ID:  [name], DOB 09/15/1961, MRN [id number]  PCP:  [facility]  Cardiologist:   Farren Nelles, MD   Chief Complaint  Patient presents with   Other    OD F/u d/c amiodarone x3 months, d/c carvedilol and entresto 24/26  due to feeling dizzy/swimmy headed at work x1 month. Meds reviewed verbally with pt.      History of Present Illness: Steven Pham is a 61 y.o. male who presents for a follow-up visit regarding chronic systolic heart failure and atrial fibrillation.  He has known history of essential hypertension, obesity and obstructive sleep apnea.  He was hospitalized in December of 2022 with diverticulitis complicated by acute renal failure.  He was noted to have intermittent tachycardia and thus had an echocardiogram done which showed an EF of 20 to 25%, grade 3 diastolic dysfunction and mild to moderate mitral regurgitation.  We switched him from metoprolol to carvedilol and added small dose losartan and spironolactone.   He was rehospitalized at the end of December with portal vein thrombosis thought to be due to increased inflammation related to his diverticulitis.  He was hypotensive on presentation.  He was anticoagulated and ultimately discharged on Eliquis.  Due to hypotension and acute kidney injury, amlodipine, furosemide and losartan were held.   He was subsequently diagnosed with atrial fibrillation with rapid ventricular response complicated by hypotension.  He was hospitalized for that reason and was anticoagulated.  He converted to sinus rhythm with IV amiodarone.  Cardiac catheterization was planned but could not be done due to bacteremia.  Echocardiogram showed an EF of 30 to 35%. Coronary angiography done in February 2023 showed normal coronary arteries, moderately reduced LV systolic function with an EF of 35 to 40%.  Right heart catheterization showed normal filling pressures, normal pulmonary  pressures and normal cardiac output.  I added small dose carvedilol after that.   He was subsequently seen in the office in February and I added Entresto at that time.  He was supposed to follow-up shortly after that for up titration of medications but has not been seen since then. He felt dizzy after he started taking Entresto and he decided to stop Entresto, Coreg and amiodarone.  He continues to take Eliquis.  [facility] but has not been seen there in few months. He returns for follow-up with me and he is noted to be tachycardic with a heart rate of 160 bpm.  He does not know how long he has been tachycardic but he felt palpitations for few months now.  In spite of this, he denies chest pain or shortness of breath.  He gained significant amount of weight since February but he reports that his appetite has improved since his diverticulitis was treated.  No chest pain.    Past Medical History:  Diagnosis Date   Arthritis    Cardiomyopathy (HCC)    a. 11/2021 Echo: EF 20-25%, glob HK. GrIII DD. Nl RV size/fxn. Mildly elev PASP. Sev dil LA, mildly dil RA. Mild to mod MR.   Gout    Hypertension    NSVT (nonsustained ventricular tachycardia) (HCC) 11/2021   PSVT (paroxysmal supraventricular tachycardia) 11/2021    Past Surgical History:  Procedure Laterality Date   RIGHT/LEFT HEART CATH AND CORONARY ANGIOGRAPHY N/A 02/11/2022   Procedure: RIGHT/LEFT HEART CATH AND CORONARY ANGIOGRAPHY;  Surgeon: Isabelle Matt A, MD;  Location: [facility] INVASIVE CV LAB;    Service: Cardiovascular;  Laterality: N/A;     Current Outpatient Medications  Medication Sig Dispense Refill   acetaminophen (TYLENOL) 650 MG CR tablet Take 650 mg by mouth every 8 (eight) hours as needed for pain.     amiodarone (PACERONE) 200 MG tablet Take 1 tablet (200 mg total) by mouth daily. 30 tablet 2   apixaban (ELIQUIS) 5 MG TABS tablet Take 1 tablet (5 mg total) by mouth 2 (two) times daily. 180 tablet 1    colchicine 0.6 MG tablet Take 1 tablet (0.6 mg total) by mouth daily. 90 tablet 3   carvedilol (COREG) 3.125 MG tablet Take 1 tablet (3.125 mg total) by mouth 2 (two) times daily. (Patient not taking: Reported on 11/19/2022) 60 tablet 3   famotidine (PEPCID) 20 MG tablet Take 1 tablet (20 mg total) by mouth 2 (two) times daily. (Patient not taking: Reported on 11/19/2022) 60 tablet 0   sacubitril-valsartan (ENTRESTO) 24-26 MG Take 1 tablet by mouth 2 (two) times daily. (Patient not taking: Reported on 11/19/2022) 60 tablet 2   No current facility-administered medications for this visit.    Allergies:   Patient has no known allergies.    Social History:  The patient  reports that he has never smoked. He has never used smokeless tobacco. He reports that he does not drink alcohol and does not use drugs.   Family History:  The patient's family history includes Coronary artery disease in his mother.    ROS:  Please see the history of present illness.   Otherwise, review of systems are positive for none.   All other systems are reviewed and negative.    PHYSICAL EXAM: VS:  BP (!) 130/100 (BP Location: Left Arm, Patient Position: Sitting, Cuff Size: Large)   Pulse (!) 161   Ht 5' 3" (1.6 m)   Wt 271 lb (122.9 kg)   SpO2 97%   BMI 48.01 kg/m  , BMI Body mass index is 48.01 kg/m. GEN: Well nourished, well developed, in no acute distress  HEENT: normal  Neck: no JVD, carotid bruits, or masses Cardiac: Regular and tachycardic,  no murmurs, rubs, or gallops,no edema  Respiratory:  clear to auscultation bilaterally, normal work of breathing GI: soft, nontender, nondistended, + BS MS: no deformity or atrophy  Skin: warm and dry, no rash Neuro:  Strength and sensation are intact Psych: euthymic mood, full affect Right radial pulses normal with no hematoma.  EKG:  EKG is ordered today. The ekg ordered today demonstrates : Atrial flutter versus coarse atrial fibrillation with a heart rate of  161 bpm.  LVH with repolarization abnormalities.    Recent Labs: 12/15/2021: TSH 0.822 01/08/2022: Magnesium 1.9 01/11/2022: B Natriuretic Peptide 341.9 01/29/2022: ALT 22; BUN 12; Creatinine, Ser 0.88; Potassium 3.2; Sodium 137 02/04/2022: Hemoglobin 10.2; Platelets 358    Lipid Panel    Component Value Date/Time   CHOL 226 (H) 01/04/2022 0550   TRIG 305 (H) 01/04/2022 0550   HDL 14 (L) 01/04/2022 0550   CHOLHDL 16.1 01/04/2022 0550   VLDL 61 (H) 01/04/2022 0550   LDLCALC 151 (H) 01/04/2022 0550      Wt Readings from Last 3 Encounters:  11/19/22 271 lb (122.9 kg)  02/26/22 242 lb 6 oz (109.9 kg)  02/04/22 243 lb (110.2 kg)           No data to display            ASSESSMENT AND PLAN:  1.  Persistent atrial fibrillation/flutter:   He is back in atrial fibrillation with a heart rate of 161 bpm.  Unfortunately, he stopped taking his cardiac medications on his own with the exception of Eliquis.  He reports compliance with Eliquis and has not missed any doses.  I had a prolonged discussion with him about the importance of compliance and the implications of continued tachycardia in the setting of cardiomyopathy.  I discussed with him the high risk for decompensated heart failure and death. I resumed amiodarone at 200 mg twice daily and added metoprolol tartrate 25 mg twice daily.  Continue Eliquis and schedule cardioversion on Friday.  I discussed the procedure in details as well as risks and benefits.  2.  Chronic systolic heart failure: This is due to nonischemic cardiomyopathy.  Initial ejection fraction was 20 to 25% subsequently improved to 35 to 40% on recent cardiac catheterization.  He stopped eating Entresto and carvedilol due to dizziness.  The plan is to repeat echocardiogram after restoration of sinus rhythm and restart his heart failure medications gradually.  He reports poor tolerance.  3.  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  4.  Gout: He uses colchicine as needed.   Disposition: Start metoprolol and resume amiodarone.  Continue Eliquis and proceed with cardioversion on Friday.  I asked him to go to the ED if he becomes symptomatic.   Signed,  Claira Jeter, MD  11/19/2022 5:06 PM    [facility] 

## 2022-11-19 NOTE — Patient Instructions (Signed)
Medication Instructions:  START Metoprolol Tartrate 25 mg twice daily START Amiodarone 200 mg twice daily  *If you need a refill on your cardiac medications before your next appointment, please call your pharmacy*  Follow-Up: At [facility], you and your health needs are our priority.  As part of our continuing mission to provide you with exceptional heart care, we have created designated Provider Care Teams.  These Care Teams include your primary Cardiologist (physician) and Advanced Practice Providers (APPs -  Physician Assistants and Nurse Practitioners) who all work together to provide you with the care you need, when you need it.  We recommend signing up for the patient portal called "MyChart".  Sign up information is provided on this After Visit Summary.  MyChart is used to connect with patients for Virtual Visits (Telemedicine).  Patients are able to view lab/test results, encounter notes, upcoming appointments, etc.  Non-urgent messages can be sent to your provider as well.   To learn more about what you can do with MyChart, go to [email or web address].    Your next appointment:   2 week(s)  The format for your next appointment:   In Person  Provider:   You may see Lorine Bears, MD or one of the following Advanced Practice Providers on your designated Care Team:   Nicolasa Ducking, NP Eula Listen, PA-C Cadence Fransico Michael, PA-C Charlsie Quest, NP    Other Instructions  You are scheduled for a Cardioversion on (we will call you to set this up)  Please arrive at the [facility] at [facility], [address] [place], [place] [address].  After entering the Medical Mall please check-in at the registration desk (1st desk on your right) to receive your armband. (1 hour prior to procedure unless lab work is needed; if lab work is needed arrive 1.5 hours ahead)  DIET: Nothing to eat or drink after midnight except a sip of water with medications (see medication instructions  below)  Medication Instructions: Hold -nothing to hold Continue your anticoagulant: Eliquis If you miss a dose, please call us at [phone] You will need to continue your anticoagulant after your procedure until you are told by your provider that it is safe to stop.   Labs: Your provider would like for you to return on 11/22 to have the following labs drawn: CBC and BMET.   Please go to the [facility] entrance and check in at the front desk.  You do not need an appointment.  They are open from 7am-6 pm.  You will not need to be fasting.   FYI: For your safety, and to allow [place] to monitor your vital signs accurately during the surgery/procedure we request that if you have artificial nails, gel coating, SNS etc. Please have those removed prior to your surgery/procedure. Not having the nail coverings /polish removed may result in cancellation or delay of your surgery/procedure.  You must have a responsible person to drive you home and stay in the waiting area during your procedure. Failure to do so could result in cancellation.  Bring your insurance cards.  If you have any questions after you get home, please call the office at [phone]  *Special Note: Every effort is made to have your procedure done on time. Occasionally there are emergencies that occur at the hospital that may cause delays. Please be patient if a delay does occur.

## 2022-11-20 ENCOUNTER — Telehealth: Payer: Self-pay | Admitting: *Deleted

## 2022-11-20 NOTE — Telephone Encounter (Signed)
Ok thanks 

## 2022-11-20 NOTE — Telephone Encounter (Signed)
Attempted to reach the patient to give him lab and cardioversion instructions. Was unable to leave a message.   You are scheduled for a Cardioversion on 11/24 with Dr. Kirke Corin.  Please arrive at the [facility] at [facility], [address] [place], [place] [address] at 6:30 am.  After entering the Medical Mall please check-in at the registration desk (1st desk on your right) to receive your armband. (1 hour prior to procedure)  DIET: Nothing to eat or drink after midnight except a sip of water with medications (see medication instructions below)  Medication Instructions: Hold-Nothing to hold Continue your anticoagulant: Eliquis If you miss a dose, please call us at [phone] You will need to continue your anticoagulant after your procedure until you are told by your provider that it is safe to stop.   Your provider would like for you to have following labs drawn: CBC and BMET.   Please go to the [facility] entrance and check in at the front desk.  You do not need an appointment.  They are open from 7am-6 pm.   FYI: For your safety, and to allow [place] to monitor your vital signs accurately during the surgery/procedure we request that if you have artificial nails, gel coating, SNS etc. Please have those removed prior to your surgery/procedure. Not having the nail coverings /polish removed may result in cancellation or delay of your surgery/procedure.  You must have a responsible person to drive you home and stay in the waiting area during your procedure. Failure to do so could result in cancellation.  Bring your insurance cards.  If you have any questions after you get home, please call the office at [phone]  *Special Note: Every effort is made to have your procedure done on time. Occasionally there are emergencies that occur at the hospital that may cause delays. Please be patient if a delay does occur.

## 2022-11-20 NOTE — Telephone Encounter (Signed)
Spoke to the patient. He has been advised of the instructions for the cardioversion and lab work prior. He stated that he will "try" to make it as scheduled. He has been advised that he will need to be here by 6:30 Friday morning and labs done prior and the importance of being there on time.

## 2022-11-21 MED ORDER — SODIUM CHLORIDE 0.9 % IV SOLN: INTRAVENOUS | Status: DC

## 2022-11-22 ENCOUNTER — Ambulatory Visit: Payer: BC Managed Care – PPO | Admitting: Certified Registered Nurse Anesthetist

## 2022-11-22 ENCOUNTER — Encounter: Payer: Self-pay | Admitting: Cardiovascular Disease

## 2022-11-22 ENCOUNTER — Ambulatory Visit
Admission: RE | Admit: 2022-11-22 | Discharge: 2022-11-22 | Disposition: A | Discharge status: Home / Self Care | Payer: BC Managed Care – PPO | Attending: Cardiovascular Disease | Admitting: Cardiovascular Disease

## 2022-11-22 ENCOUNTER — Encounter
Admission: RE | Disposition: A | Discharge status: Home / Self Care | Payer: Self-pay | Source: Home / Self Care | Attending: Cardiovascular Disease

## 2022-11-22 ENCOUNTER — Other Ambulatory Visit: Payer: Self-pay

## 2022-11-22 DIAGNOSIS — T46906A Underdosing of unspecified agents primarily affecting the cardiovascular system, initial encounter: Secondary | ICD-10-CM | POA: Diagnosis not present

## 2022-11-22 DIAGNOSIS — I4819 Other persistent atrial fibrillation: Secondary | ICD-10-CM | POA: Diagnosis not present

## 2022-11-22 DIAGNOSIS — E669 Obesity, unspecified: Secondary | ICD-10-CM | POA: Insufficient documentation

## 2022-11-22 DIAGNOSIS — M109 Gout, unspecified: Secondary | ICD-10-CM | POA: Diagnosis not present

## 2022-11-22 DIAGNOSIS — I4892 Unspecified atrial flutter: Secondary | ICD-10-CM | POA: Diagnosis not present

## 2022-11-22 DIAGNOSIS — G4733 Obstructive sleep apnea (adult) (pediatric): Secondary | ICD-10-CM | POA: Diagnosis not present

## 2022-11-22 DIAGNOSIS — Z6841 Body Mass Index (BMI) 40.0 and over, adult: Secondary | ICD-10-CM | POA: Insufficient documentation

## 2022-11-22 DIAGNOSIS — I110 Hypertensive heart disease with heart failure: Secondary | ICD-10-CM | POA: Insufficient documentation

## 2022-11-22 DIAGNOSIS — I428 Other cardiomyopathies: Secondary | ICD-10-CM | POA: Insufficient documentation

## 2022-11-22 DIAGNOSIS — Z86718 Personal history of other venous thrombosis and embolism: Secondary | ICD-10-CM | POA: Diagnosis not present

## 2022-11-22 DIAGNOSIS — Z7901 Long term (current) use of anticoagulants: Secondary | ICD-10-CM | POA: Diagnosis not present

## 2022-11-22 DIAGNOSIS — I5022 Chronic systolic (congestive) heart failure: Secondary | ICD-10-CM | POA: Insufficient documentation

## 2022-11-22 DIAGNOSIS — Z538 Procedure and treatment not carried out for other reasons: Secondary | ICD-10-CM | POA: Insufficient documentation

## 2022-11-22 DIAGNOSIS — Z91128 Patient's intentional underdosing of medication regimen for other reason: Secondary | ICD-10-CM | POA: Insufficient documentation

## 2022-11-22 SURGERY — CARDIOVERSION
Anesthesia: General

## 2022-11-22 NOTE — Interval H&P Note (Signed)
History and Physical Interval Note:  11/22/2022 7:28 AM  The patient presented for elective cardioversion.  However, preprocedure EKG showed that he was in sinus rhythm.  The procedure was canceled.  I asked him to continue same medications for now.   Lorine Bears

## 2022-11-22 NOTE — Progress Notes (Signed)
Patient noted to be in Sinus Bradycardia, notified Cardiologist, and procedure cancelled.  Patient asked about his abnormal rhythm, atrial fibrillation, and the medication that has "changed it back to normal".  Patient asked for information regarding abnormal rhythm and printed the AVS to include information about atrial fibrillation.  In addition, reviewed follow-up appointment already scheduled with Cardiology.  Answered questions.

## 2022-11-24 IMAGING — CR DG CHEST 2V
1 series · 2 of 2 positions shown · non-contrast
Comparison: None.

CLINICAL DATA: Shortness of breath

EXAM:
CHEST - 2 VIEW

[Series 1: dg chest 2 view · 0.14mm/px · 2 of 2 slices shown]
[im 1/2]
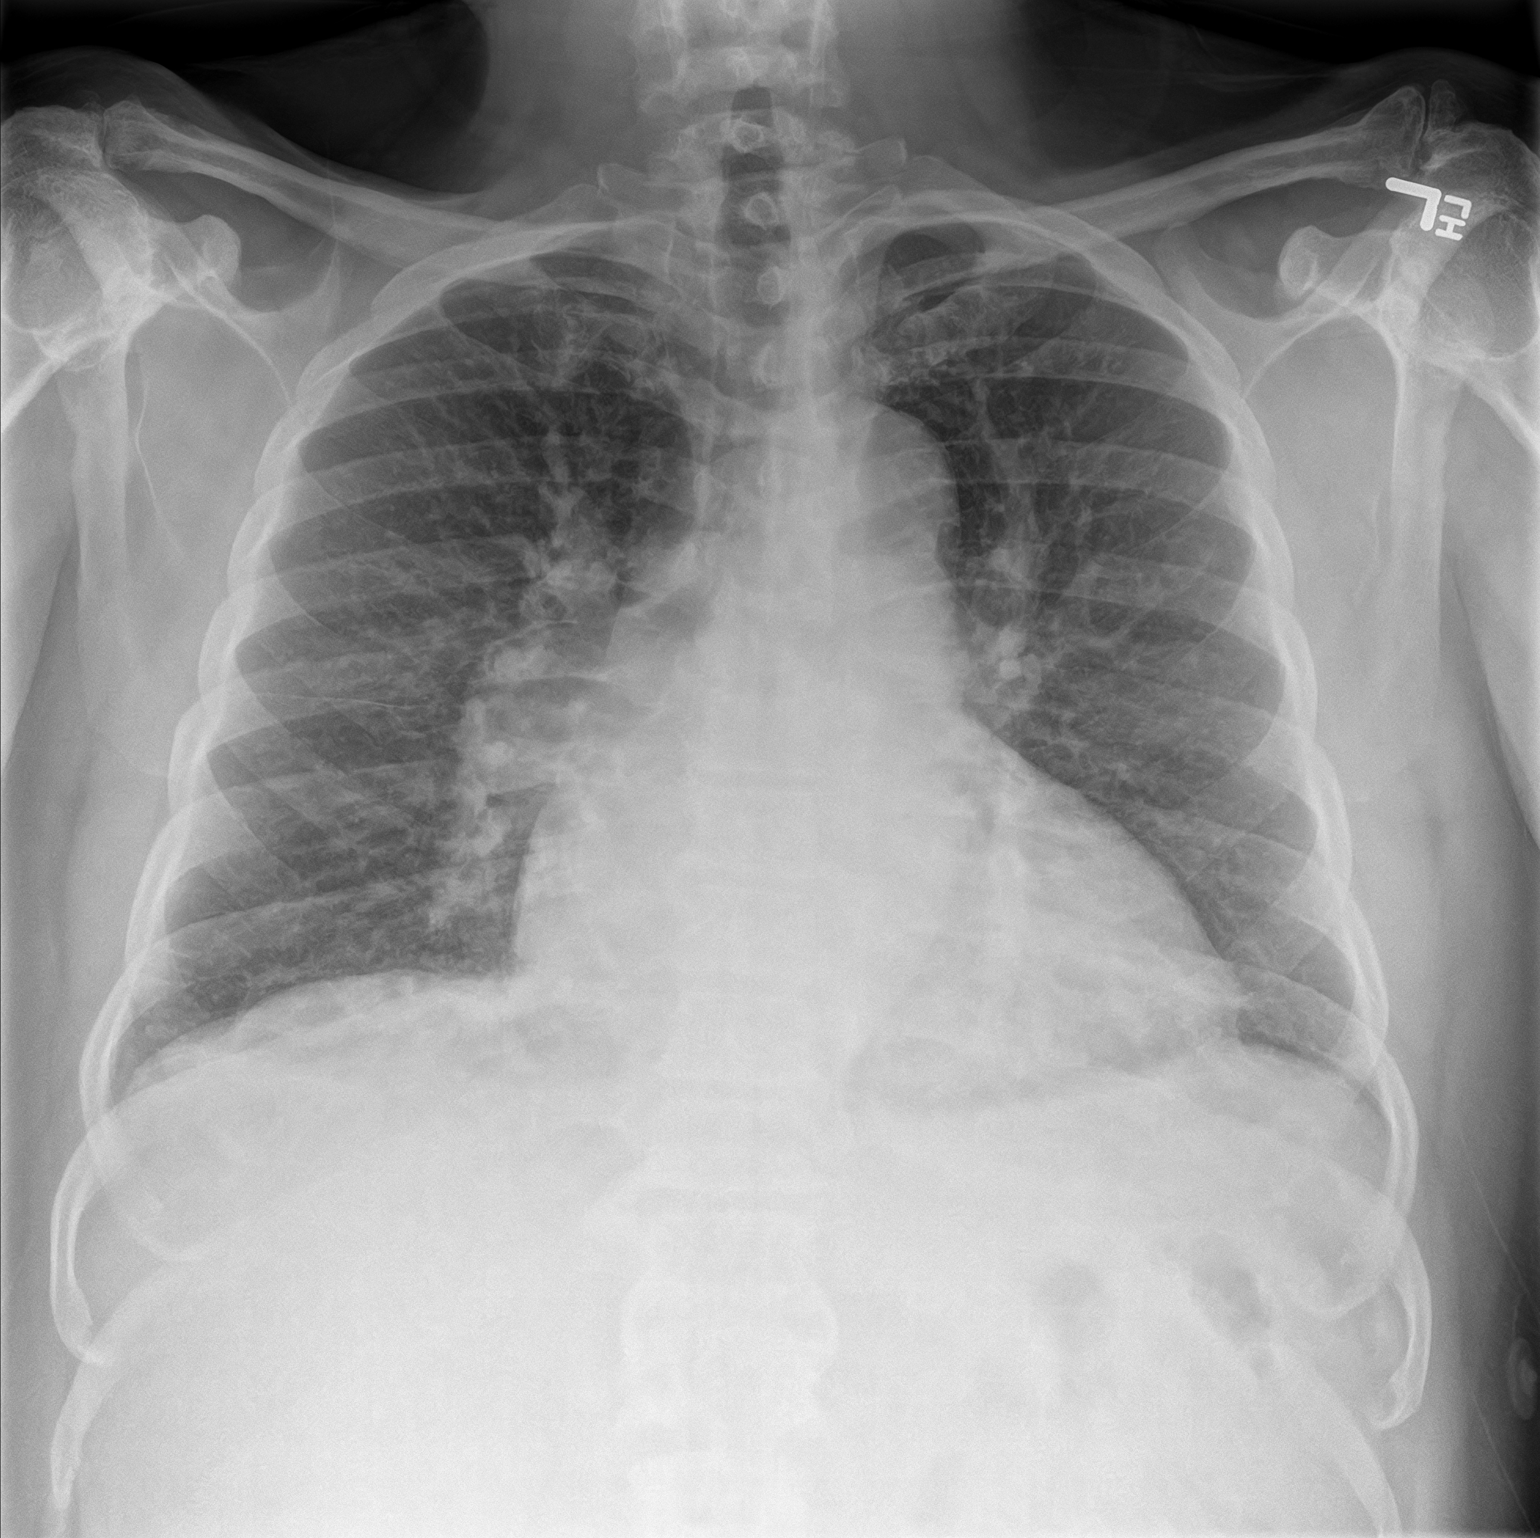
[im 2/2]
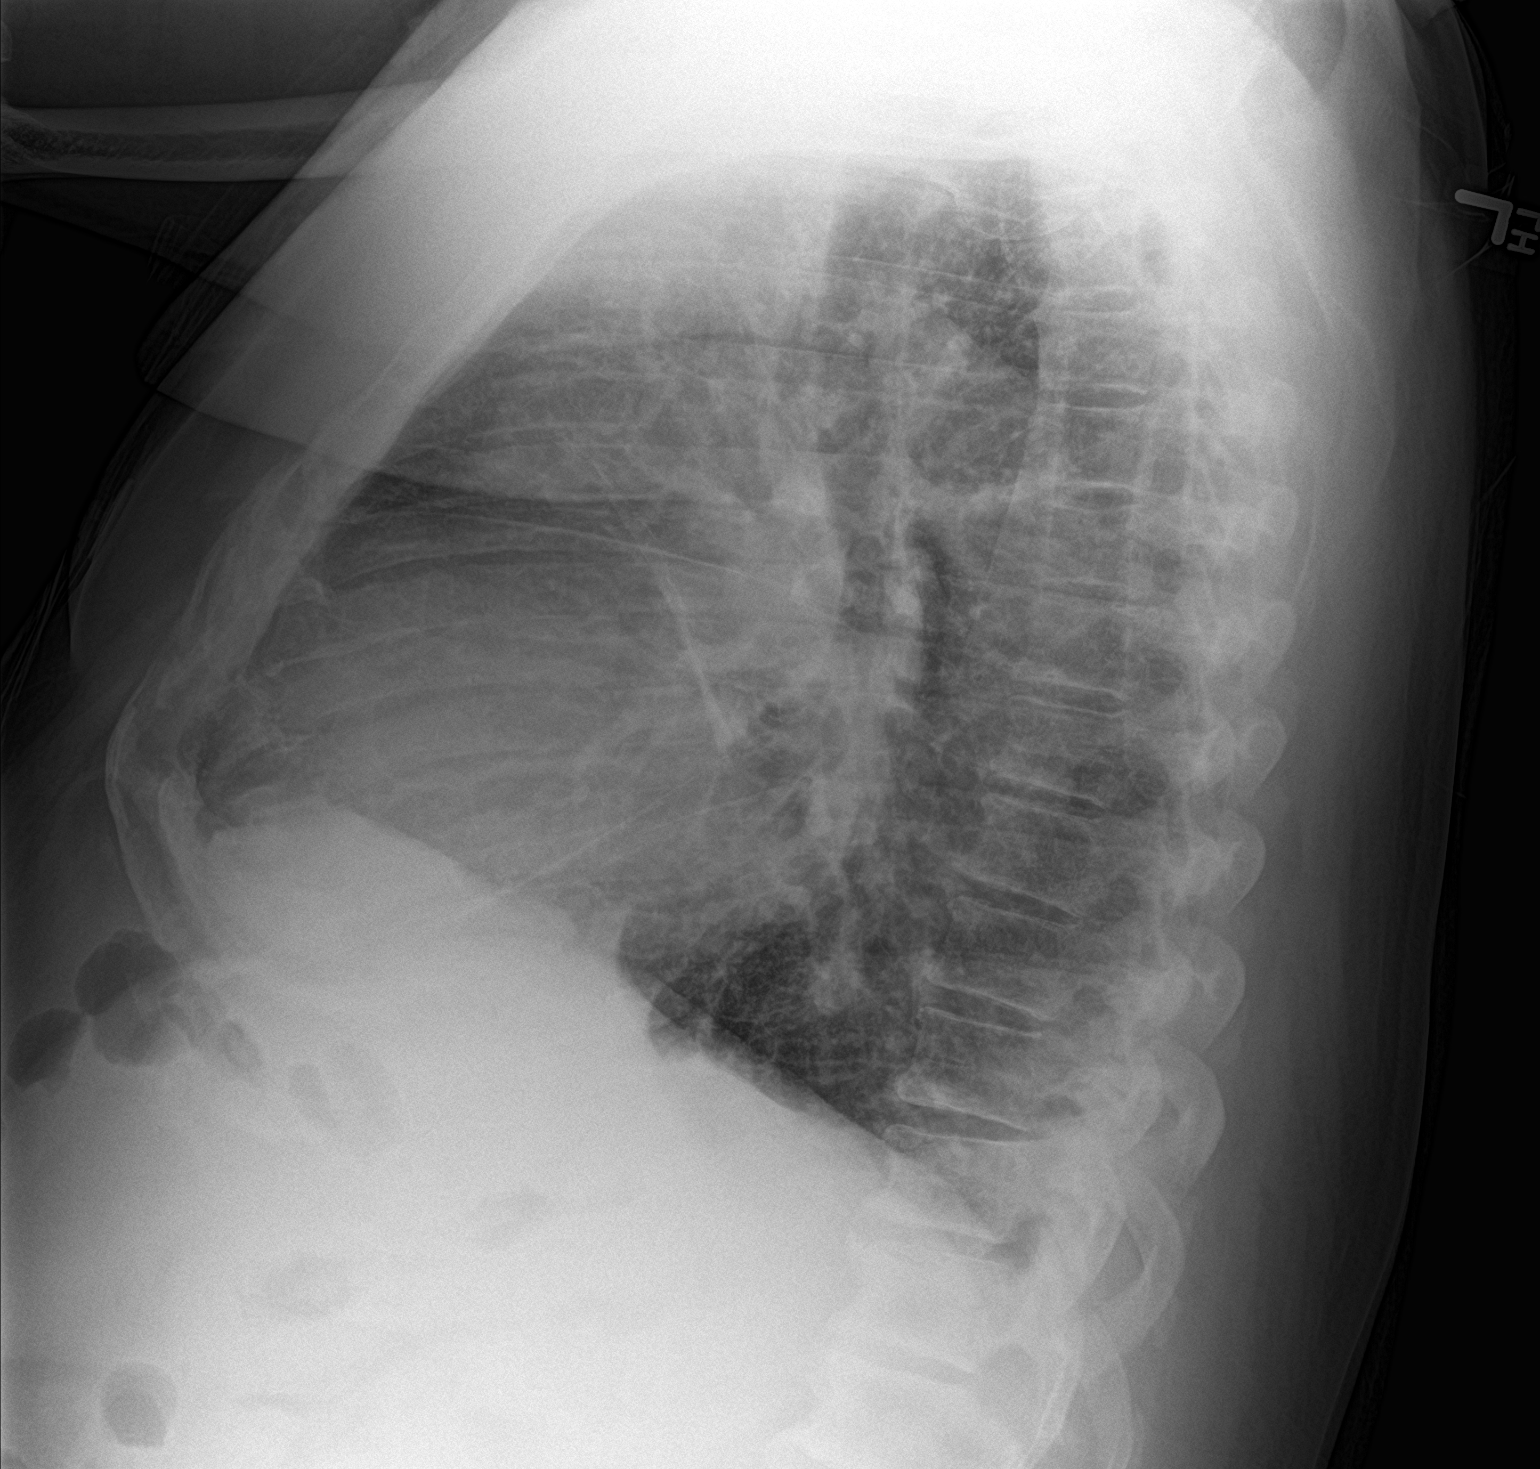

[2 of 2 positions shown; findings below may reference images not displayed]

FINDINGS: There is minimal right base atelectasis. The lungs elsewhere are
clear. There is cardiomegaly with pulmonary venous hypertension. No
adenopathy. No pneumothorax. There is arthropathy in each shoulder.
IMPRESSION: Cardiomegaly with pulmonary vascular congestion. No edema or
airspace opacity. Slight right base atelectasis.

## 2022-11-26 DIAGNOSIS — Z043 Encounter for examination and observation following other accident: Secondary | ICD-10-CM | POA: Diagnosis not present

## 2022-11-26 DIAGNOSIS — Z713 Dietary counseling and surveillance: Secondary | ICD-10-CM | POA: Diagnosis not present

## 2022-11-26 DIAGNOSIS — I10 Essential (primary) hypertension: Secondary | ICD-10-CM | POA: Diagnosis not present

## 2022-12-10 ENCOUNTER — Ambulatory Visit: Payer: BC Managed Care – PPO | Attending: Cardiology | Admitting: Cardiology

## 2022-12-11 ENCOUNTER — Encounter: Payer: Self-pay | Admitting: Cardiology

## 2022-12-24 ENCOUNTER — Telehealth: Payer: Self-pay | Admitting: Cardiovascular Disease

## 2022-12-24 NOTE — Telephone Encounter (Signed)
-----   Message from Dalia Heading sent at 12/19/2022  2:26 PM EST ----- Attempted to call patient to r/s missed appointment. No VM set up ----- Message ----- From: Sandi Mariscal, RN Sent: 12/19/2022   2:13 PM EST To: Mickie Bail Burl Scheduling  This patient needs first available with Dr. Kirke Corin or APP. He didn't come to his last appointment and needs a follow up-thank you.

## 2022-12-24 NOTE — Telephone Encounter (Signed)
Called to reschedule  No VM 

## 2023-01-06 NOTE — Telephone Encounter (Signed)
Called to schedule ?No VM  ?

## 2023-01-23 ENCOUNTER — Encounter: Payer: Self-pay | Admitting: Cardiovascular Disease

## 2023-01-23 ENCOUNTER — Telehealth: Payer: Self-pay | Admitting: *Deleted

## 2023-01-23 NOTE — Telephone Encounter (Signed)
Error

## 2023-01-23 NOTE — Telephone Encounter (Signed)
   Pre-operative Risk Assessment    Patient Name: Steven Pham  DOB: 09-01-1961 MRN: [id number]     Request for Surgical Clearance    Procedure:  Dental Extraction - Amount of Teeth to be Pulled:  1-2 TEETH SURGICAL EXTRACTION  ALONG ALVEOLOPLASTY  Date of Surgery:  Clearance TBD                                 Surgeon:  DR. Fabio Bering Surgeon's Group or Practice Name:  [facility]  Phone number:  [phone] Fax number:  [phone]   Type of Clearance Requested:   - Medical  - Pharmacy:  Hold Apixaban (Eliquis)     Type of Anesthesia:  Local    Additional requests/questions:    Jiles Prows   01/23/2023, 12:34 PM

## 2023-01-23 NOTE — Telephone Encounter (Signed)
Office for update. Please advise

## 2023-01-24 NOTE — Telephone Encounter (Signed)
I tried to call the DDS office by they are closed on Friday's. I was going to update them that we are awaiting on pre op pharm-d.

## 2023-01-24 NOTE — Telephone Encounter (Signed)
VM not set up, could not leave message to call back to set up tele pre op appt.

## 2023-01-24 NOTE — Telephone Encounter (Signed)
Patient with diagnosis of afib on Eliquis for anticoagulation.    Procedure: Dental Extraction - Amount of Teeth to be Pulled:  1-2 TEETH SURGICAL EXTRACTION  ALONG ALVEOLOPLASTY  Date of procedure: TBD  CHA2DS2-VASc Score = 3  This indicates a 3.2% annual risk of stroke. The patient's score is based upon: CHF History: 1 HTN History: 1 Diabetes History: 1 Stroke History: 0 Vascular Disease History: 0 Age Score: 0 Gender Score: 0  Portal vein thrombosis 12/26/21 prior to afib dx - thought to be due to increased inflammation related to diverticulitis.   CrCl >133mL/min Platelet count 358K  Patient does not require pre-op antibiotics for dental procedure.  Per office protocol, patient can hold Eliquis for 1 day prior to procedure.    **This guidance is not considered finalized until pre-operative APP has relayed final recommendations.**

## 2023-01-24 NOTE — Telephone Encounter (Signed)
   Name: Steven Pham  DOB: [birth year]  MRN: [id number]  Primary Cardiologist: Kathlyn Sacramento, MD  Chart reviewed as part of pre-operative protocol coverage. Because of Steven Pham's past medical history and time since last visit, he will require a follow-up telephone visit in order to better assess preoperative cardiovascular risk.  Pre-op covering staff: - Please schedule appointment and call patient to inform them. If patient already had an upcoming appointment within acceptable timeframe, please add "pre-op clearance" to the appointment notes so provider is aware. - Please contact requesting surgeon's office via preferred method (i.e, phone, fax) to inform them of need for appointment prior to surgery.  Patient does not require pre-op antibiotics for dental procedure.   Per office protocol, patient can hold Eliquis for 1 day prior to procedure.   Elgie Collard, PA-C  01/24/2023, 1:35 PM

## 2023-01-27 ENCOUNTER — Telehealth: Payer: Self-pay | Admitting: *Deleted

## 2023-01-27 NOTE — Telephone Encounter (Signed)
Pt called back and then handed his phone off to [name] at the DDS office. The pt just walked in to DDS office today. [name] and I worked together with the pt and scheduled a tele pre op appt 01/31/23 @ 3:20. Pt works until 3 and cannot answer his phone while working. Pt is on ABX right now for dental infection. Consent given but meds not done.

## 2023-01-27 NOTE — Telephone Encounter (Signed)
Pt called back and then handed his phone off to [name] at the DDS office. The pt just walked in to DDS office today. [name] and I worked together with the pt and scheduled a tele pre op appt 01/31/23 @ 3:20. Pt works until 3 and cannot answer his phone while working. Pt is on ABX right now for dental infection. Consent given but meds not done.     Patient Consent for Virtual Visit        Steven Pham has provided verbal consent on 01/27/2023 for a virtual visit (video or telephone).   CONSENT FOR VIRTUAL VISIT FOR:  Steven Pham  By participating in this virtual visit I agree to the following:  I hereby voluntarily request, consent and authorize [facility] and its employed or contracted physicians, physician assistants, nurse practitioners or other licensed health care professionals (the Practitioner), to provide me with telemedicine health care services (the "Services") as deemed necessary by the treating Practitioner. I acknowledge and consent to receive the Services by the Practitioner via telemedicine. I understand that the telemedicine visit will involve communicating with the Practitioner through live audiovisual communication technology and the disclosure of certain medical information by electronic transmission. I acknowledge that I have been given the opportunity to request an in-person assessment or other available alternative prior to the telemedicine visit and am voluntarily participating in the telemedicine visit.  I understand that I have the right to withhold or withdraw my consent to the use of telemedicine in the course of my care at any time, without affecting my right to future care or treatment, and that the Practitioner or I may terminate the telemedicine visit at any time. I understand that I have the right to inspect all information obtained and/or recorded in the course of the telemedicine visit and may receive copies of available information for a reasonable  fee.  I understand that some of the potential risks of receiving the Services via telemedicine include:  Delay or interruption in medical evaluation due to technological equipment failure or disruption; Information transmitted may not be sufficient (e.g. poor resolution of images) to allow for appropriate medical decision making by the Practitioner; and/or  In rare instances, security protocols could fail, causing a breach of personal health information.  Furthermore, I acknowledge that it is my responsibility to provide information about my medical history, conditions and care that is complete and accurate to the best of my ability. I acknowledge that Practitioner's advice, recommendations, and/or decision may be based on factors not within their control, such as incomplete or inaccurate data provided by me or distortions of diagnostic images or specimens that may result from electronic transmissions. I understand that the practice of medicine is not an exact science and that Practitioner makes no warranties or guarantees regarding treatment outcomes. I acknowledge that a copy of this consent can be made available to me via my patient portal ([facility]), or I can request a printed copy by calling the office of [facility].    I understand that my insurance will be billed for this visit.   I have read or had this consent read to me. I understand the contents of this consent, which adequately explains the benefits and risks of the Services being provided via telemedicine.  I have been provided ample opportunity to ask questions regarding this consent and the Services and have had my questions answered to my satisfaction. I give my informed consent for the services to  be provided through the use of telemedicine in my medical care

## 2023-01-27 NOTE — Telephone Encounter (Signed)
I s/w [name] with Dr. Carron Brazen, DDS office. She stated that the pt has a flip phone and works until and he does not answer his phone until after work. I said that I will call the pt after 3 pm and set up a tele visit. I thanked [name] for her help as well.

## 2023-01-27 NOTE — Telephone Encounter (Signed)
Steven Pham with [facility] is returning call requesting updates on clearance. I informed her that per previous not, it looks like the patient needs a telehealth appointment before official clearance recommendation is made. Please confirm.

## 2023-01-27 NOTE — Telephone Encounter (Signed)
Tried again to reach pt to set up tele pre op appt. VM not set up could not leave message. I will send FYI to requesting the pt is going to need to call our office to schedule a tele pre op appt before he can be cleared for his dental work.

## 2023-01-31 ENCOUNTER — Ambulatory Visit: Payer: BC Managed Care – PPO | Attending: Cardiovascular Disease | Admitting: Physician Assistant

## 2023-01-31 DIAGNOSIS — Z01810 Encounter for preprocedural cardiovascular examination: Secondary | ICD-10-CM | POA: Diagnosis not present

## 2023-01-31 NOTE — Telephone Encounter (Signed)
Please see today's virtual telephone visit for clearance letter.  I have forwarded my letter to the surgeon's office.

## 2023-01-31 NOTE — Progress Notes (Signed)
Virtual Visit via Telephone Note   Because of Steven Pham's co-morbid illnesses, he is at least at moderate risk for complications without adequate follow up.  This format is felt to be most appropriate for this patient at this time.  The patient did not have access to video technology/had technical difficulties with video requiring transitioning to audio format only (telephone).  All issues noted in this document were discussed and addressed.  No physical exam could be performed with this format.  Please refer to the patient's chart for his consent to telehealth for [facility].  Evaluation Performed:  Preoperative cardiovascular risk assessment _____________   Date:  01/31/2023   Patient ID:  Steven Pham, DOB [birth year], Steven Pham Patient Location:  Home Provider location:   Office  Primary Care Provider:  [facility] Primary Cardiologist:  Kathlyn Sacramento, MD  Chief Complaint / Patient Profile   62 y.o. y/o male with a h/o hypertension, portal vein thrombosis, obstructive sleep apnea, chronic systolic heart failure and A-fib who is pending dental extraction and alveoloplasty and presents today for telephonic preoperative cardiovascular risk assessment.  History of Present Illness    Steven Pham is a 62 y.o. male who presents via audio/video conferencing for a telehealth visit today.  Pt was last seen in cardiology clinic on 11/19/2022 by Dr. Fletcher Anon.  At that time Steven Pham was in atrial fibrillation with RVR, outpatient cardioversion was scheduled for 11/24, however patient self converted therefore the cardioversion procedure was canceled.  The patient is now pending procedure as outlined above. Since his last visit, he has been doing well.  He denies any elevation of the heart rate.  He denies any recent chest pain or worsening dyspnea on exertion.  Past Medical History    Past Medical History:  Diagnosis Date   Arthritis    Cardiomyopathy  ([place])    a. 11/2021 Echo: EF 20-25%, glob HK. GrIII DD. Nl RV size/fxn. Mildly elev PASP. Sev dil LA, mildly dil RA. Mild to mod MR.   Gout    Hypertension    NSVT (nonsustained ventricular tachycardia) (HCC) 11/2021   PSVT (paroxysmal supraventricular tachycardia) 11/2021   Past Surgical History:  Procedure Laterality Date   RIGHT/LEFT HEART CATH AND CORONARY ANGIOGRAPHY N/A 02/11/2022   Procedure: RIGHT/LEFT HEART CATH AND CORONARY ANGIOGRAPHY;  Surgeon: Wellington Hampshire, MD;  Location: [place] [facility];  Service: Cardiovascular;  Laterality: N/A;    Allergies  No Known Allergies  Home Medications    Prior to Admission medications   Medication Sig Start Date End Date Taking? Authorizing Provider  acetaminophen (TYLENOL) 650 MG CR tablet Take 650 mg by mouth every 8 (eight) hours as needed for pain.    [provider]  amiodarone (PACERONE) 200 MG tablet Take 1 tablet (200 mg total) by mouth 2 (two) times daily. 11/19/22   Wellington Hampshire, MD  apixaban (ELIQUIS) 5 MG TABS tablet Take 1 tablet (5 mg total) by mouth 2 (two) times daily. 05/10/22   Wellington Hampshire, MD  colchicine 0.6 MG tablet Take 1 tablet (0.6 mg total) by mouth daily. 03/18/22   Wellington Hampshire, MD  famotidine (PEPCID) 20 MG tablet Take 1 tablet (20 mg total) by mouth 2 (two) times daily. Patient not taking: Reported on 11/19/2022 01/22/22   Carrie Mew, MD  metoprolol tartrate (LOPRESSOR) 25 MG tablet Take 1 tablet (25 mg total) by mouth 2 (two) times daily. 11/19/22 01/18/23  Wellington Hampshire,  MD    Physical Exam    Vital Signs:  Steven Pham does not have vital signs available for review today.  Given telephonic nature of communication, physical exam is limited. AAOx3. NAD. Normal affect.  Speech and respirations are unlabored.  Accessory Clinical Findings    None  Assessment & Plan    1.  Preoperative Cardiovascular Risk Assessment:  -[name] is a 62 year old male with PAF,  chronic systolic heart failure and obstructive sleep apnea who is pending surgical dental extraction with alveoloplasty.  He does not require any SBE prophylaxis from the cardiac perspective, however he is currently on a course of antibiotic for dental infection.  He was previously noted to be in A-fib with RVR in November 2023, however prior to outpatient cardioversion procedure, he has self converted.  He has not noted any recurrent A-fib or fast heart rate.  He is at acceptable risk to proceed with low risk procedure.  He may hold Eliquis for 1 day prior to dental extraction and restart as soon as possible afterward at the discretion of Dr. Carron Brazen based on bleeding risk.  The patient was advised that if he develops new symptoms prior to surgery to contact our office to arrange for a follow-up visit, and he verbalized understanding.   A copy of this note will be routed to requesting surgeon.  Time:   Today, I have spent 5 minutes with the patient with telehealth technology discussing medical history, symptoms, and management plan.     [place], [place]  01/31/2023, 3:22 PM

## 2023-02-13 ENCOUNTER — Telehealth: Payer: Self-pay | Admitting: Cardiovascular Disease

## 2023-02-13 ENCOUNTER — Other Ambulatory Visit: Payer: Self-pay | Admitting: Cardiovascular Disease

## 2023-02-13 NOTE — Telephone Encounter (Signed)
Patient needs an appt with Arida please.

## 2023-02-13 NOTE — Telephone Encounter (Signed)
Patient is scheduled for an appt 03/29 to see NP Murray Hodgkins at 3:35 pm.

## 2023-02-13 NOTE — Telephone Encounter (Signed)
Requested Prescriptions   Signed Prescriptions Disp Refills   colchicine 0.6 MG tablet 30 tablet 0    Sig: TAKE 1 TABLET BY MOUTH ONCE DAILY.    Authorizing Provider: Kathlyn Sacramento A    Ordering User: Janan Ridge

## 2023-02-13 NOTE — Telephone Encounter (Signed)
*  STAT* If patient is at the pharmacy, call can be transferred to refill team.   1. Which medications need to be refilled? (please list name of each medication and dose if known) colchicine 0.6 MG tablet   2. Which pharmacy/location (including street and city if local pharmacy) is medication to be sent to?  [place], [place]    3. Do they need a 30 day or 90 day supply? 21   Pt states [facility]

## 2023-03-02 ENCOUNTER — Other Ambulatory Visit: Payer: Self-pay

## 2023-03-02 ENCOUNTER — Emergency Department
Admission: EM | Admit: 2023-03-02 | Discharge: 2023-03-02 | Disposition: A | Discharge status: Home / Self Care | Payer: BC Managed Care – PPO | Attending: Emergency Medicine | Admitting: Emergency Medicine

## 2023-03-02 ENCOUNTER — Encounter: Payer: Self-pay | Admitting: Intensive Care

## 2023-03-02 DIAGNOSIS — K029 Dental caries, unspecified: Secondary | ICD-10-CM | POA: Diagnosis not present

## 2023-03-02 DIAGNOSIS — Z7901 Long term (current) use of anticoagulants: Secondary | ICD-10-CM | POA: Diagnosis not present

## 2023-03-02 DIAGNOSIS — I110 Hypertensive heart disease with heart failure: Secondary | ICD-10-CM | POA: Diagnosis not present

## 2023-03-02 DIAGNOSIS — I4891 Unspecified atrial fibrillation: Secondary | ICD-10-CM | POA: Insufficient documentation

## 2023-03-02 DIAGNOSIS — I5041 Acute combined systolic (congestive) and diastolic (congestive) heart failure: Secondary | ICD-10-CM | POA: Diagnosis not present

## 2023-03-02 DIAGNOSIS — K0889 Other specified disorders of teeth and supporting structures: Secondary | ICD-10-CM | POA: Diagnosis not present

## 2023-03-02 MED ORDER — AMOXICILLIN-POT CLAVULANATE 875-125 MG PO TABS: 1.0000 | ORAL_TABLET | Freq: Two times a day (BID) | ORAL | 0 refills | Status: AC

## 2023-03-02 NOTE — Discharge Instructions (Signed)
Please follow-up with your dentist this week as you have scheduled.  You may take the antibiotics as prescribed.  Please return for any new, worsening, or change in symptoms or other concerns.  It was a pleasure caring for you today.

## 2023-03-02 NOTE — ED Provider Notes (Signed)
[facility] Provider Note    Event Date/Time   First MD Initiated Contact with Patient 03/02/23 [phone]     (approximate)   History   Eye Pain   HPI  Steven Pham is a 62 y.o. male with a past medical history of heart failure with reduced ejection fraction, hypertension, A-fib on anticoagulation who presents today for evaluation of right upper tooth pain.  Patient reports that he had his tooth pulled a week ago, and he has his follow-up appointment in 2 days, however he has noticed mild pain and swelling at the site.  No discharge.  No voice changes.  No trismus.  No sublingual swelling.  Patient denies any eye pain or visual changes at all.  Patient Active Problem List   Diagnosis Date Noted   HFrEF (heart failure with reduced ejection fraction) ([place])    Long term current use of anticoagulant therapy    HTN (hypertension) 01/04/2022   Atrial fibrillation with rapid ventricular response ([place]) 01/03/2022   Portal vein thrombosis 12/26/2021   Gout flare    Hypotension    Chronic systolic heart failure (HCC)    Leukocytosis    Acute combined systolic and diastolic congestive heart failure ([place]) 12/19/2021   Sepsis ([place]) 12/16/2021   AKI (acute kidney injury) ([place]) 12/16/2021   Hypokalemia 12/16/2021   Hyponatremia 12/16/2021   Elevated troponin 12/16/2021   Transaminitis 12/16/2021   Abnormal EKG 12/16/2021   Essential hypertension 12/16/2021   Diverticulitis of small bowel 12/14/2021        Physical Exam   Triage Vital Signs: ED Triage Vitals  Enc Vitals Group     BP 03/02/23 0824 (!) 140/103     Pulse Rate 03/02/23 0824 65     Resp 03/02/23 0824 16     Temp 03/02/23 0824 (!) 97.5 F (36.4 C)     Temp Source 03/02/23 0824 Oral     SpO2 03/02/23 0824 99 %     Weight 03/02/23 0823 260 lb (117.9 kg)     Height 03/02/23 0823 '5\' 3"'$  (1.6 m)     Head Circumference --      Peak Flow --      Pain Score 03/02/23 0823 0     Pain Loc --      Pain  Edu? --      Excl. in [place]? --     Most recent vital signs: Vitals:   03/02/23 0824  BP: (!) 140/103  Pulse: 65  Resp: 16  Temp: (!) 97.5 F (36.4 C)  SpO2: 99%    Physical Exam Vitals and nursing note reviewed.  Constitutional:      General: Awake and alert. No acute distress.    Appearance: Normal appearance. The patient is normal weight.  HENT:     Head: Normocephalic and atraumatic.     Mouth: Mucous membranes are moist.  Poor dentition with multiple missing teeth.  Recent site of tooth removal to right upper molar with mild gingival erythema and tenderness without fluctuance.  No appreciable facial or neck swelling or erythema.  No neck pain, swelling, or erythema.  No sublingual swelling. Eyes:     General: PERRL. Normal EOMs        Right eye: No discharge.        Left eye: No discharge.     Conjunctiva/sclera: Conjunctivae normal.  Cardiovascular:     Rate and Rhythm: Normal rate and regular rhythm.     Pulses: Normal pulses.  Pulmonary:     Effort: Pulmonary effort is normal. No respiratory distress.     Breath sounds: Normal breath sounds.  Abdominal:     Abdomen is soft. There is no abdominal tenderness. No rebound or guarding. No distention. Musculoskeletal:        General: No swelling. Normal range of motion.     Cervical back: Normal range of motion and neck supple.  Skin:    General: Skin is warm and dry.     Capillary Refill: Capillary refill takes less than 2 seconds.     Findings: No rash.  Neurological:     Mental Status: The patient is awake and alert.      ED Results / Procedures / Treatments   Labs (all labs ordered are listed, but only abnormal results are displayed) Labs Reviewed - No data to display   EKG     RADIOLOGY     PROCEDURES:  Critical Care performed:   Procedures   MEDICATIONS ORDERED IN ED: Medications - No data to display   IMPRESSION / MDM / [place] / ED COURSE  I reviewed the triage vital  signs and the nursing notes.   Differential diagnosis includes, but is not limited to, recent surgical site pain, gingivitis, abscess, dental decay, pulpitis, caries.  Patient was evaluated in the emergency department for dental pain. Patient has poor dentition and multiple missing teeth.  He has mild gingival swelling in his right upper teeth at the site of his tooth removal.  Patient is worried about infection developing at the site, and is possible that he has early gingivitis or early swelling.  No gingival fluctuance concerning for gingival abscess.  No trismus, nuchal rigidity, neck pain, hot potato voice, uvular deviation or malocclusion to suggest deep space infection. No sublingual swelling concerning for Ludwig's angina.  Patient was started on antibiotics.  No periorbital erythema or ecchymosis, no proptosis, not consistent with preseptal or orbital cellulitis.  No pain with extraocular movements.  He has no pain or complaints with his eye whatsoever.  Patient was treated symptomatically in the emergency department. Discussed care plan, return precautions, and advised close outpatient follow-up with dentist. Patient agrees with plan of care.   Patient's presentation is most consistent with acute complicated illness / injury requiring diagnostic workup.      FINAL CLINICAL IMPRESSION(S) / ED DIAGNOSES   Final diagnoses:  Tooth pain     Rx / DC Orders   ED Discharge Orders          Ordered    amoxicillin-clavulanate (AUGMENTIN) 875-125 MG tablet  2 times daily        03/02/23 0837             Note:  This document was prepared using Dragon voice recognition software and may include unintentional dictation errors.   Marquette Old, PA-C 03/02/23 [id number]    Nathaniel Man, MD 03/02/23 1520

## 2023-03-02 NOTE — ED Triage Notes (Signed)
Patient c/o right eye swelling and pain. Reports he got a tooth pulled around 2 weeks ago on upper right side.

## 2023-03-27 NOTE — Progress Notes (Deleted)
Cardiology Office Note:    Date:  03/28/2023   ID:  Steven Pham, DOB 1961/06/12, MRN [id number]  PCP:  [facility] [place] Providers Cardiologist:  Kathlyn Sacramento, MD { Click to update primary MD,subspecialty MD or APP then REFRESH:1}    Referring MD: [facility]*   No chief complaint on file. ***  History of Present Illness:    Steven Pham is a 62 y.o. male with a hx of hypertension, HFrEF, NICM, portal vein thrombosis, atrial fibrillation (amiodarone, Eliquis), OSA, gout, CKD III, anemia of chronic disease, MR (mild >mod).   He was hospitalized in December 2022 and noted to be in acute renal failure.  He also had intermittent tachycardia, an echocardiogram was obtained which showed an EF of 20 to 123456, grade 3 diastolic dysfunction, mild to moderate MR.    He was evaluated by Dr. Fletcher Anon on 01/03/2022 and noted to be in A-fib with RVR and was sent to the ED for evaluation where he was subsequently admitted. He was initially treated with IV amiodarone and converted to sinus rhythm. He was also diagnosed with portal vein thrombosis and vascular surgery was consulted however there was no suitable vascular intervention identified for him.  Further ischemic workup was completed as outpatient.  He underwent a left heart cath on 02/11/2022 which showed moderate LV systolic dysfunction, EF 35 to 45%, normal coronary arteries.  He was not evaluated again until 11/19/2022, he had stopped Entresto, Coreg and amiodarone although he continues to take his Eliquis.  At this visit he was noted to be tachycardic with heart rate of 160 bpm, although he was relatively asymptomatic with the exception of feeling palpitations.  His amiodarone was restarted, metoprolol was added, cardioversion was planned.  On 11/22/2022 presented for DCCV however was found to be in sinus rhythm. Continue Amiodarone x 2 months  CBC, BMET, TSH ok on 11/12/22  Past Medical History:   Diagnosis Date   Anemia, chronic disease    Arthritis    Atrial fibrillation ([place])    2023   Cardiomyopathy ([place])    a. 11/2021 Echo: EF 20-25%, glob HK. GrIII DD. Nl RV size/fxn. Mildly elev PASP. Sev dil LA, mildly dil RA. Mild to mod MR.   CKD (chronic kidney disease), stage III (HCC)    Gout    Hypertension    Mitral regurgitation 2022   mild > mod   NSVT (nonsustained ventricular tachycardia) ([place]) 11/2021   OSA (obstructive sleep apnea)    Portal vein thrombosis 2023   PSVT (paroxysmal supraventricular tachycardia) 11/2021    Past Surgical History:  Procedure Laterality Date   RIGHT/LEFT HEART CATH AND CORONARY ANGIOGRAPHY N/A 02/11/2022   Procedure: RIGHT/LEFT HEART CATH AND CORONARY ANGIOGRAPHY;  Surgeon: Wellington Hampshire, MD;  Location: [place] [facility];  Service: Cardiovascular;  Laterality: N/A;    Current Medications: No outpatient medications have been marked as taking for the 03/28/23 encounter (Appointment) with Theora Gianotti, NP.     Allergies:   Patient has no known allergies.   Social History   Socioeconomic History   Marital status: Single    Spouse name: Not on file   Number of children: Not on file   Years of education: Not on file   Highest education level: Not on file  Occupational History   Not on file  Tobacco Use   Smoking status: Never   Smokeless tobacco: Never  Vaping Use   Vaping Use: Never  used  Substance and Sexual Activity   Alcohol use: No   Drug use: No   Sexual activity: Not on file  Other Topics Concern   Not on file  Social History Narrative   Lives locally by himself.  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  Does not routinely exercise.   Social Determinants of Health   Financial Resource Strain: Not on file  Food Insecurity: Not on file  Transportation Needs: Not on file  Physical Activity: Not on file  Stress: Not on file  Social Connections: Not on file     Family History: The patient's ***family  history includes Coronary artery disease in his mother.  ROS:   Please see the history of present illness.    *** All other systems reviewed and are negative.  EKGs/Labs/Other Studies Reviewed:    The following studies were reviewed today: ***  EKG:  EKG is *** ordered today.  The ekg ordered today demonstrates ***  Recent Labs: No results found for requested labs within last 365 days.  Recent Lipid Panel    Component Value Date/Time   CHOL 226 (H) 01/04/2022 0550   TRIG 305 (H) 01/04/2022 0550   HDL 14 (L) 01/04/2022 0550   CHOLHDL 16.1 01/04/2022 0550   VLDL 61 (H) 01/04/2022 0550   LDLCALC 151 (H) 01/04/2022 0550     Risk Assessment/Calculations:   {Does this patient have ATRIAL FIBRILLATION?[phone]}  No BP recorded.  {Refresh Note OR Click here to enter BP  :1}***         Physical Exam:    VS:  There were no vitals taken for this visit.    Wt Readings from Last 3 Encounters:  03/02/23 260 lb (117.9 kg)  11/22/22 261 lb (118.4 kg)  11/19/22 271 lb (122.9 kg)     GEN: *** Well nourished, well developed in no acute distress HEENT: Normal NECK: No JVD; No carotid bruits LYMPHATICS: No lymphadenopathy CARDIAC: ***RRR, no murmurs, rubs, gallops RESPIRATORY:  Clear to auscultation without rales, wheezing or rhonchi  ABDOMEN: Soft, non-tender, non-distended MUSCULOSKELETAL:  No edema; No deformity  SKIN: Warm and dry NEUROLOGIC:  Alert and oriented x 3 PSYCHIATRIC:  Normal affect   ASSESSMENT:    1. Essential hypertension   2. HFrEF (heart failure with reduced ejection fraction) ([place])   3. OSA (obstructive sleep apnea)   4. Paroxysmal atrial fibrillation (HCC)   5. Mixed hyperlipidemia   6. Mitral valve insufficiency, unspecified etiology    PLAN:    In order of problems listed above:  HTN -  HFrEF - EF 2022 20-25%, LHC 2023 EF 35-45% PAF -  HLD - LDL 147 in November 2023 OSA - not on CPAP Mitral regurgitation - mild > mod on echo 11/2021       {Are you ordering a CV Procedure (e.g. stress test, cath, DCCV, TEE, etc)?   Press F2        :[id number]    Medication Adjustments/Labs and Tests Ordered: Current medicines are reviewed at length with the patient today.  Concerns regarding medicines are outlined above.  No orders of the defined types were placed in this encounter.  No orders of the defined types were placed in this encounter.   There are no Patient Instructions on file for this visit.   Signed, Trudi Ida, NP  03/28/2023 3:15 PM    [place]

## 2023-03-28 ENCOUNTER — Encounter: Payer: Self-pay | Admitting: Nurse Practitioner

## 2023-03-28 ENCOUNTER — Ambulatory Visit: Payer: BC Managed Care – PPO | Attending: Nurse Practitioner | Admitting: Nurse Practitioner

## 2023-04-04 ENCOUNTER — Telehealth: Payer: Self-pay | Admitting: Cardiovascular Disease

## 2023-04-04 DIAGNOSIS — I4819 Other persistent atrial fibrillation: Secondary | ICD-10-CM

## 2023-04-04 MED ORDER — APIXABAN 5 MG PO TABS: 5.0000 mg | ORAL_TABLET | Freq: Two times a day (BID) | ORAL | 5 refills | Status: DC

## 2023-04-04 NOTE — Telephone Encounter (Addendum)
Prescription refill request for Eliquis received. Indication: Afib  Last office visit: 11/19/22 Kirke Corin)  Scr: 1.05 (11/12/22 via [facility] tab)  Age: 62 Weight: 117.9kg  Appropriate dose. Refill sent

## 2023-04-04 NOTE — Telephone Encounter (Signed)
*  STAT* If patient is at the pharmacy, call can be transferred to refill team.   1. Which medications need to be refilled? (please list name of each medication and dose if known)  apixaban (ELIQUIS) 5 MG TABS tablet  2. Which pharmacy/location (including street and city if local pharmacy) is medication to be sent to? [facility] - [place] ([address]), [place] - [address]   3. Do they need a 30 day or 90 day supply? 30 day supply

## 2023-04-04 NOTE — Telephone Encounter (Signed)
Refill request

## 2023-05-18 ENCOUNTER — Encounter: Payer: Self-pay | Admitting: Emergency Medicine

## 2023-05-18 ENCOUNTER — Emergency Department
Admission: EM | Admit: 2023-05-18 | Discharge: 2023-05-18 | Disposition: A | Discharge status: Home / Self Care | Payer: BC Managed Care – PPO | Attending: Emergency Medicine | Admitting: Emergency Medicine

## 2023-05-18 ENCOUNTER — Other Ambulatory Visit: Payer: Self-pay

## 2023-05-18 DIAGNOSIS — I129 Hypertensive chronic kidney disease with stage 1 through stage 4 chronic kidney disease, or unspecified chronic kidney disease: Secondary | ICD-10-CM | POA: Insufficient documentation

## 2023-05-18 DIAGNOSIS — N189 Chronic kidney disease, unspecified: Secondary | ICD-10-CM | POA: Insufficient documentation

## 2023-05-18 DIAGNOSIS — K0889 Other specified disorders of teeth and supporting structures: Secondary | ICD-10-CM | POA: Diagnosis not present

## 2023-05-18 DIAGNOSIS — K029 Dental caries, unspecified: Secondary | ICD-10-CM | POA: Insufficient documentation

## 2023-05-18 DIAGNOSIS — I4891 Unspecified atrial fibrillation: Secondary | ICD-10-CM | POA: Insufficient documentation

## 2023-05-18 MED ORDER — AMOXICILLIN 875 MG PO TABS: 875.0000 mg | ORAL_TABLET | Freq: Two times a day (BID) | ORAL | 0 refills | Status: DC

## 2023-05-18 NOTE — ED Triage Notes (Signed)
Reports was seen here about a month ago for an abscess on a tooth. Pt states tooks the meds and it got better but came back. Pt report saw his dentist about 3 weeks ago who took and x-ray and advised him to come back and get another antibiotic so they can pull his tooth. Denies pain

## 2023-05-18 NOTE — ED Provider Notes (Signed)
   [facility] Provider Note    Event Date/Time   First MD Initiated Contact with Patient 05/18/23 0848     (approximate)   History   Medication Refill and Abscess   HPI  Steven Pham is a 62 y.o. male with history of gout, hypertension, A-fib and CKD presents emergency department with concerns of dental abscess.  Patient states he was told by his dentist coming get a antibiotic.  No fever or chills.  No chest pain shortness of breath      Physical Exam   Triage Vital Signs: ED Triage Vitals  Enc Vitals Group     BP 05/18/23 0844 (!) 142/109     Pulse Rate 05/18/23 0844 72     Resp --      Temp 05/18/23 0844 98.1 F (36.7 C)     Temp Source 05/18/23 0844 Oral     SpO2 05/18/23 0844 98 %     Weight 05/18/23 0823 260 lb 2.3 oz (118 kg)     Height 05/18/23 0823 5\' 3"  (1.6 m)     Head Circumference --      Peak Flow --      Pain Score 05/18/23 0823 0     Pain Loc --      Pain Edu? --      Excl. in [place]? --     Most recent vital signs: Vitals:   05/18/23 0844  BP: (!) 142/109  Pulse: 72  Temp: 98.1 F (36.7 C)  SpO2: 98%     General: Awake, no distress.   CV:  Good peripheral perfusion. regular rate and  rhythm Resp:  Normal effort.  Abd:  No distention.   Other:  Right upper gum with some swelling but there is a large caries noted in one of the remaining teeth on the right side   ED Results / Procedures / Treatments   Labs (all labs ordered are listed, but only abnormal results are displayed) Labs Reviewed - No data to display   EKG     RADIOLOGY     PROCEDURES:   Procedures   MEDICATIONS ORDERED IN ED: Medications - No data to display   IMPRESSION / MDM / ASSESSMENT AND PLAN / ED COURSE  I reviewed the triage vital signs and the nursing notes.                              Differential diagnosis includes, but is not limited to, dental pain, dental abscess, dental caries  Patient's presentation is most  consistent with acute, uncomplicated illness.   Patient was given antibiotic.  He is to follow-up with his dentist.  He is in agreement treatment plan.  Discharged stable condition.      FINAL CLINICAL IMPRESSION(S) / ED DIAGNOSES   Final diagnoses:  Pain due to dental caries     Rx / DC Orders   ED Discharge Orders          Ordered    amoxicillin (AMOXIL) 875 MG tablet  2 times daily        05/18/23 0859             Note:  This document was prepared using Dragon voice recognition software and may include unintentional dictation errors.    Faythe Ghee, PA-C 05/18/23 0901    Phineas Semen, MD 05/18/23 [phone]

## 2023-06-09 ENCOUNTER — Telehealth: Payer: Self-pay | Admitting: Cardiovascular Disease

## 2023-06-09 NOTE — Telephone Encounter (Signed)
*  STAT* If patient is at the pharmacy, call can be transferred to refill team.   1. Which medications need to be refilled? (please list name of each medication and dose if known) amiodarone (PACERONE) 200 MG tablet ; colchicine 0.6 MG tablet   2. Which pharmacy/location (including street and city if local pharmacy) is medication to be sent to? [facility] - [place] ([address]), [place] - [address]   3. Do they need a 30 day or 90 day supply? 90

## 2023-06-09 NOTE — Telephone Encounter (Signed)
Please contact pt for future appointment. Pt overdue for 2 wk f/u. 

## 2023-06-10 ENCOUNTER — Telehealth: Payer: Self-pay | Admitting: Cardiovascular Disease

## 2023-06-10 ENCOUNTER — Encounter: Payer: Self-pay | Admitting: Cardiovascular Disease

## 2023-06-10 ENCOUNTER — Other Ambulatory Visit: Payer: Self-pay | Admitting: Cardiovascular Disease

## 2023-06-10 DIAGNOSIS — I4819 Other persistent atrial fibrillation: Secondary | ICD-10-CM

## 2023-06-10 NOTE — Telephone Encounter (Signed)
*  STAT* If patient is at the pharmacy, call can be transferred to refill team.   1. Which medications need to be refilled? (please list name of each medication and dose if known) amiodarone (PACERONE) 200 MG tablet   colchicine 0.6 MG tablet   2. Which pharmacy/location (including street and city if local pharmacy) is medication to be sent to? [facility] - [place] ([address]), [place] - [address]   3. Do they need a 30 day or 90 day supply? 90   Patient appt has been schedule 8/29. Patient has two pills left

## 2023-06-10 NOTE — Telephone Encounter (Signed)
Patient has scheduled appt for Dr. Kirke Corin and would now like this medication called in. States he is almost out of this medication.

## 2023-06-10 NOTE — Telephone Encounter (Signed)
Attempted to contact pt. Unable to leave message as mailbox is full.  °

## 2023-06-10 NOTE — Telephone Encounter (Signed)
Error

## 2023-06-10 NOTE — Telephone Encounter (Signed)
*  STAT* If patient is at the pharmacy, call can be transferred to refill team.   1. Which medications need to be refilled? (please list name of each medication and dose if known) Metoprolo, Amiodarone  2. Which pharmacy/location (including street and city if local pharmacy) is medication to be sent to?[facility] [address]  3. Do they need a 30 day or 90 day supply? 90

## 2023-06-10 NOTE — Telephone Encounter (Signed)
Hi,  Please advise if ok to refill colchicine or defer to PCP? Refill of amiodarone sent to pharmacy.  Thank you so much,  Juma Oxley.

## 2023-06-11 ENCOUNTER — Telehealth: Payer: Self-pay | Admitting: Cardiovascular Disease

## 2023-06-11 MED ORDER — COLCHICINE 0.6 MG PO TABS: 0.6000 mg | ORAL_TABLET | Freq: Every day | ORAL | 0 refills | Status: DC

## 2023-06-11 NOTE — Telephone Encounter (Signed)
Duplicate

## 2023-06-11 NOTE — Telephone Encounter (Signed)
30 day refill has been sent in with no refills.   The patient stated he would go back to [facility].

## 2023-06-11 NOTE — Telephone Encounter (Signed)
*  STAT* If patient is at the pharmacy, call can be transferred to refill team.   1. Which medications need to be refilled? (please list name of each medication and dose if known) colchicine 0.6 MG tablet   2. Which pharmacy/location (including street and city if local pharmacy) is medication to be sent to? [id number] - [place], [place] - [address]   3. Do they need a 30 day or 90 day supply? 30 day  Patient is out of medication.

## 2023-06-11 NOTE — Telephone Encounter (Signed)
Okay to give 30-day refill.  We do this on a temporary basis until he gets a primary care physician but in general, this is outside the scope of my practice.

## 2023-06-11 NOTE — Telephone Encounter (Signed)
Please advise if ok to refill non cardiac medication. 

## 2023-06-11 NOTE — Telephone Encounter (Signed)
The patient stated that he no longer has a PCP. He has been advised that he needs to get one who can oversee the care of his gout. He stated that he would but wants to know if he can have a 30 day refill.

## 2023-06-11 NOTE — Telephone Encounter (Signed)
Patient states that he no longer goes to his PCP and states that it is a waste of gas to keep going back and forth to the pharmacy and in pain for this medication that has been filled in the past by Dr. Kirke Corin. Requesting call back to discuss this further.

## 2023-06-11 NOTE — Telephone Encounter (Signed)
That should be refilled by his primary care physician.

## 2023-07-03 ENCOUNTER — Other Ambulatory Visit: Payer: Self-pay | Admitting: Cardiovascular Disease

## 2023-07-04 NOTE — Telephone Encounter (Signed)
Please advise if patient is to continue taking this medication. Thank you!

## 2023-08-11 ENCOUNTER — Other Ambulatory Visit: Payer: Self-pay

## 2023-08-11 ENCOUNTER — Telehealth: Payer: Self-pay | Admitting: Cardiovascular Disease

## 2023-08-11 DIAGNOSIS — I4819 Other persistent atrial fibrillation: Secondary | ICD-10-CM

## 2023-08-11 MED ORDER — APIXABAN 5 MG PO TABS: 5.0000 mg | ORAL_TABLET | Freq: Two times a day (BID) | ORAL | 1 refills | Status: DC

## 2023-08-11 NOTE — Telephone Encounter (Signed)
*  STAT* If patient is at the pharmacy, call can be transferred to refill team.   1. Which medications need to be refilled? (please list name of each medication and dose if known)  colchicine 0.6 MG tablet apixaban (ELIQUIS) 5 MG TABS tablet  2. Which pharmacy/location (including street and city if local pharmacy) is medication to be sent to?[id number] - [place] - [address]   3. Do they need a 30 day or 90 day supply? 30 day supply for Colchicine and 90 day supply for Eliquis  Pt is currently out of the medication.

## 2023-08-11 NOTE — Telephone Encounter (Signed)
*  STAT* If patient is at the pharmacy, call can be transferred to refill team.   1. Which medications need to be refilled? (please list name of each medication and dose if known) colchicine 0.6 MG tablet   2. Which pharmacy/location (including street and city if local pharmacy) is medication to be sent to? [id number] - [place] - [address]   3. Do they need a 30 day or 90 day supply? 90   Patient is out of medication and waiting at the pharmacy now. Mediation was suppose to be sent earlier

## 2023-08-11 NOTE — Telephone Encounter (Signed)
Attempt to call the patient to inform him that he would have to request a refill for colchicine through his primary care provider. I was unable to speak to the patient or leave a message due to his voice mailbox not being set up.

## 2023-08-11 NOTE — Telephone Encounter (Signed)
Prescription refill request for Eliquis received. Indication:afib Last office visit:11/23 Scr:0.88 2023 Age: 62 Weight:118  kg  Prescription refilled

## 2023-08-11 NOTE — Telephone Encounter (Signed)
Refill request

## 2023-08-11 NOTE — Telephone Encounter (Signed)
Hi,  Please advise if ok to refill medication or defer to PCP . Thank you so much.

## 2023-08-11 NOTE — Telephone Encounter (Signed)
I was contacted by the Call Center directly as the patient was currently at the pharmacy. He had requested refills on Eliquis and colchicine. The patient called back to the office upset that his colchicine was not refilled.  I advised the patient that per 06/11/23 phone documentation he was advised that he would need to follow up with his PCP for more refills.  The patient kept talking over me stating "Dr. Kirke Corin should understand I need this medication and it shouldn't matter what doctor this comes from. I might have to find another practice if you won't fill my medication."  I advised the patient that unfortunately we cannot fill his colchicine as this is used to treat his gout and not a cardiac medication.   The patient continued to talk over me.  I inquired when the patient last saw [facility] as this was listed as he PCP, which he confirmed. He advised March 2024. I advised the patient I would reach out to [facility] on his behalf to see if they could refill his medication.  However, I was uncertain he would have an answer today. I also advised the patient that it is his responsibility to follow up with his PCP on a regular basis to address any other issue that is non-cardiac related.   The patient advised he would wait for a call back.  I did call and speak with [facility] staff and they advised the patient is non-compliant and has no showed or cancelled several appointments. He was last seen March of 2023 so they will not refill his medication. I inquired if someone from their office could please reach out to the patient and advise him regarding his refill and I was told they would.  Will forward to Dr. Colletta Maryland, RN in the interim for further advisement and for final say regarding colchicine refill.

## 2023-08-14 NOTE — Telephone Encounter (Signed)
I cannot refill colchicine.  We clearly explained to him that he needed to see his primary care physician. In addition, he missed his follow-up appointments in our office and has not been seen since November.

## 2023-08-15 NOTE — Telephone Encounter (Signed)
The patient stated that he will be seen next Tuesday, 8/20 by the [facility] for a refill of the Colchicine.

## 2023-08-21 DIAGNOSIS — I4891 Unspecified atrial fibrillation: Secondary | ICD-10-CM | POA: Diagnosis not present

## 2023-08-21 DIAGNOSIS — Z1389 Encounter for screening for other disorder: Secondary | ICD-10-CM | POA: Diagnosis not present

## 2023-08-21 DIAGNOSIS — M109 Gout, unspecified: Secondary | ICD-10-CM | POA: Diagnosis not present

## 2023-08-28 ENCOUNTER — Ambulatory Visit: Payer: BC Managed Care – PPO | Admitting: Cardiovascular Disease

## 2023-09-23 ENCOUNTER — Emergency Department
Admission: EM | Admit: 2023-09-23 | Discharge: 2023-09-23 | Discharge status: Against Medical Advice | Payer: BC Managed Care – PPO | Attending: Emergency Medicine | Admitting: Emergency Medicine

## 2023-09-23 DIAGNOSIS — K0889 Other specified disorders of teeth and supporting structures: Secondary | ICD-10-CM | POA: Diagnosis not present

## 2023-09-23 DIAGNOSIS — Z5321 Procedure and treatment not carried out due to patient leaving prior to being seen by health care provider: Secondary | ICD-10-CM | POA: Insufficient documentation

## 2023-09-23 NOTE — ED Triage Notes (Signed)
Pt c/o lower L dental pain. Pt with poor dentition stating that he knows he has multiple teeth needing extraction, but unable to complete it right now. Requesting abx to treat for possible abscess. Denies recent fevers.

## 2023-10-02 ENCOUNTER — Ambulatory Visit: Payer: BC Managed Care – PPO | Admitting: Cardiovascular Disease

## 2023-10-12 ENCOUNTER — Emergency Department
Admission: EM | Admit: 2023-10-12 | Discharge: 2023-10-12 | Disposition: A | Discharge status: Home / Self Care | Payer: BC Managed Care – PPO | Attending: Emergency Medicine | Admitting: Emergency Medicine

## 2023-10-12 ENCOUNTER — Other Ambulatory Visit: Payer: Self-pay

## 2023-10-12 ENCOUNTER — Encounter: Payer: Self-pay | Admitting: Emergency Medicine

## 2023-10-12 DIAGNOSIS — K029 Dental caries, unspecified: Secondary | ICD-10-CM | POA: Insufficient documentation

## 2023-10-12 DIAGNOSIS — N189 Chronic kidney disease, unspecified: Secondary | ICD-10-CM | POA: Diagnosis not present

## 2023-10-12 DIAGNOSIS — I129 Hypertensive chronic kidney disease with stage 1 through stage 4 chronic kidney disease, or unspecified chronic kidney disease: Secondary | ICD-10-CM | POA: Diagnosis not present

## 2023-10-12 DIAGNOSIS — K0889 Other specified disorders of teeth and supporting structures: Secondary | ICD-10-CM | POA: Diagnosis not present

## 2023-10-12 MED ORDER — AMOXICILLIN 875 MG PO TABS: 875.0000 mg | ORAL_TABLET | Freq: Two times a day (BID) | ORAL | 0 refills | Status: AC

## 2023-10-12 NOTE — ED Triage Notes (Signed)
Pt here with drnal pain. Pt states he has some swelling and pain d/t some cavities that he needs to have removed. Pt is asking for abx or pain medicine until he is able to get to a dentist. Pt denies fever.

## 2023-10-12 NOTE — ED Provider Notes (Signed)
[facility] Provider Note    Event Date/Time   First MD Initiated Contact with Patient 10/12/23 1037     (approximate)   History   Dental Pain   HPI  Steven Pham is a 62 y.o. male with history of hypertension, cardiomegaly, CKD presents emergency department with complaints of dental pain and facial swelling.  Patient states he just got insurance and is gone to get all of his teeth pulled.  Thinks he needs to be on antibiotic prior to going to the dentist.  Symptoms for 3 to 4 days      Physical Exam   Triage Vital Signs: ED Triage Vitals [10/12/23 1028]  Encounter Vitals Group     BP (!) 155/107     Systolic BP Percentile      Diastolic BP Percentile      Pulse Rate (!) 50     Resp 16     Temp 97.9 F (36.6 C)     Temp Source Oral     SpO2 96 %     Weight 264 lb 15.9 oz (120.2 kg)     Height 5\' 3"  (1.6 m)     Head Circumference      Peak Flow      Pain Score 0     Pain Loc      Pain Education      Exclude from Growth Chart     Most recent vital signs: Vitals:   10/12/23 1028  BP: (!) 155/107  Pulse: (!) 50  Resp: 16  Temp: 97.9 F (36.6 C)  SpO2: 96%     General: Awake, no distress.   CV:  Good peripheral perfusion. regular rate and  rhythm Resp:  Normal effort. Lungs cta Abd:  No distention.   Other:  Poor dentition noted, minimal welling noted of the right side of the face, neck is supple, no lymphadenopathy   ED Results / Procedures / Treatments   Labs (all labs ordered are listed, but only abnormal results are displayed) Labs Reviewed - No data to display   EKG     RADIOLOGY     PROCEDURES:   Procedures   MEDICATIONS ORDERED IN ED: Medications - No data to display   IMPRESSION / MDM / ASSESSMENT AND PLAN / ED COURSE  I reviewed the triage vital signs and the nursing notes.                              Differential diagnosis includes, but is not limited to, dental pain, dental abscess, dental  caries  Patient's presentation is most consistent with acute illness / injury with system symptoms.   Patient's exam is most consistent with a small abscess.  Will start the patient on amoxicillin.  He is to follow-up with his dentist.  Return emergency department worsening.  Patient is in agreement treatment plan.      FINAL CLINICAL IMPRESSION(S) / ED DIAGNOSES   Final diagnoses:  Pain due to dental caries     Rx / DC Orders   ED Discharge Orders          Ordered    amoxicillin (AMOXIL) 875 MG tablet  2 times daily        10/12/23 1156             Note:  This document was prepared using Dragon voice recognition software and may include unintentional dictation errors.  Faythe Ghee, PA-C 10/12/23 1356    Loleta Rose, MD 10/12/23 2110

## 2023-10-12 NOTE — ED Notes (Signed)
See triage note  Presents with some swelling to gumline  and possible dental abscess

## 2023-12-18 ENCOUNTER — Other Ambulatory Visit: Payer: Self-pay | Admitting: Cardiovascular Disease

## 2023-12-18 ENCOUNTER — Telehealth: Payer: Self-pay | Admitting: Cardiovascular Disease

## 2023-12-18 NOTE — Telephone Encounter (Signed)
This had already been completed.

## 2023-12-18 NOTE — Telephone Encounter (Signed)
Pt c/o medication issue:  1. Name of Medication: amiodarone (PACERONE) 200 MG tablet   2. How are you currently taking this medication (dosage and times per day)? Take 1 tablet by mouth twice daily   3. Are you having a reaction (difficulty breathing--STAT)? No  4. What is your medication issue? [facility]. They both explained they are having issues with [facility] not receiving the refill request. They would both like [place] to send the prescription to [facility] instead. Please advise.

## 2023-12-18 NOTE — Telephone Encounter (Signed)
Please contact pt for future appointment. Pt overdue for 2 wk f/u and hasn't f/u since 10/2022. Pt needing refills and must be seen yearly for continuity of refills.

## 2023-12-29 ENCOUNTER — Ambulatory Visit: Payer: BC Managed Care – PPO | Admitting: Medical

## 2023-12-29 ENCOUNTER — Telehealth: Payer: Self-pay | Admitting: Cardiovascular Disease

## 2023-12-29 NOTE — Telephone Encounter (Signed)
Scheduled patient to see Cadence Furth this afternoon at 3:35 pm due to patient not able to make previous appt on 1/3

## 2023-12-29 NOTE — Progress Notes (Deleted)
Cardiology Office Note:    Date:  12/29/2023   ID:  Steven Pham, DOB [birth year], MRN [id number]  PCP:  [facility], [facility]  [facility] Cardiologist:  Lorine Bears, MD  [facility] Electrophysiologist:  None   Referring MD: Center, [facility]*   Chief Complaint: ***  History of Present Illness:    Steven Pham is a 62 y.o. male with a hx of ***  Past Medical History:  Diagnosis Date   Anemia, chronic disease    Arthritis    Atrial fibrillation (HCC)    2023   Cardiomyopathy (HCC)    a. 11/2021 Echo: EF 20-25%, glob HK. GrIII DD. Nl RV size/fxn. Mildly elev PASP. Sev dil LA, mildly dil RA. Mild to mod MR.   CKD (chronic kidney disease), stage III (HCC)    Gout    Hypertension    Mitral regurgitation 2022   mild > mod   NSVT (nonsustained ventricular tachycardia) (HCC) 11/2021   OSA (obstructive sleep apnea)    Portal vein thrombosis 2023   PSVT (paroxysmal supraventricular tachycardia) (HCC) 11/2021    Past Surgical History:  Procedure Laterality Date   RIGHT/LEFT HEART CATH AND CORONARY ANGIOGRAPHY N/A 02/11/2022   Procedure: RIGHT/LEFT HEART CATH AND CORONARY ANGIOGRAPHY;  Surgeon: Iran Ouch, MD;  Location: [facility] INVASIVE CV LAB;  Service: Cardiovascular;  Laterality: N/A;    Current Medications: No outpatient medications have been marked as taking for the 12/29/23 encounter (Appointment) with Fransico Michael, Corrie Brannen H, PA-C.     Allergies:   Patient has no known allergies.   Social History   Socioeconomic History   Marital status: Single    Spouse name: Not on file   Number of children: Not on file   Years of education: Not on file   Highest education level: Not on file  Occupational History   Not on file  Tobacco Use   Smoking status: Never   Smokeless tobacco: Never  Vaping Use   Vaping status: Never Used  Substance and Sexual Activity   Alcohol use: No   Drug use: No   Sexual activity: Yes  Other Topics Concern   Not on  file  Social History Narrative   Lives locally by himself.  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  Does not routinely exercise.   Social Drivers of Corporate investment banker Strain: Not on file  Food Insecurity: Not on file  Transportation Needs: Not on file  Physical Activity: Not on file  Stress: Not on file  Social Connections: Not on file     Family History: The patient's ***family history includes Coronary artery disease in his mother.  ROS:   Please see the history of present illness.    *** All other systems reviewed and are negative.  EKGs/Labs/Other Studies Reviewed:    The following studies were reviewed today: ***  EKG:  EKG is *** ordered today.  The ekg ordered today demonstrates ***  Recent Labs: No results found for requested labs within last 365 days.  Recent Lipid Panel    Component Value Date/Time   CHOL 226 (H) 01/04/2022 0550   TRIG 305 (H) 01/04/2022 0550   HDL 14 (L) 01/04/2022 0550   CHOLHDL 16.1 01/04/2022 0550   VLDL 61 (H) 01/04/2022 0550   LDLCALC 151 (H) 01/04/2022 0550     Risk Assessment/Calculations:   {Does this patient have ATRIAL FIBRILLATION?[phone]}   Physical Exam:    VS:  There were no vitals taken  for this visit.    Wt Readings from Last 3 Encounters:  10/12/23 264 lb 15.9 oz (120.2 kg)  09/23/23 265 lb (120.2 kg)  05/18/23 260 lb 2.3 oz (118 kg)     GEN: *** Well nourished, well developed in no acute distress HEENT: Normal NECK: No JVD; No carotid bruits LYMPHATICS: No lymphadenopathy CARDIAC: ***RRR, no murmurs, rubs, gallops RESPIRATORY:  Clear to auscultation without rales, wheezing or rhonchi  ABDOMEN: Soft, non-tender, non-distended MUSCULOSKELETAL:  No edema; No deformity  SKIN: Warm and dry NEUROLOGIC:  Alert and oriented x 3 PSYCHIATRIC:  Normal affect   ASSESSMENT:    No diagnosis found. PLAN:    In order of problems listed above:  ***  Disposition: Follow up {follow up:15908} with ***    Shared Decision Making/Informed Consent   {Are you ordering a CV Procedure (e.g. stress test, cath, DCCV, TEE, etc)?   Press F2        :[id number]}    Signed, Reginia Battie David Stall, PA-C  12/29/2023 2:36 PM    [facility]

## 2024-01-01 ENCOUNTER — Telehealth: Payer: Self-pay | Admitting: Cardiovascular Disease

## 2024-01-01 NOTE — Telephone Encounter (Signed)
 Patient called after hours to confirm appt for tomorrow (01/03) at 8:00am. Appt for 01/03 was cancelled on 12/30 for a sooner appointment but patient missed that appointment. Another appt was made for 01/27. I called patient to advise him but MB not set up, unable to LVM.

## 2024-01-02 ENCOUNTER — Ambulatory Visit: Payer: BC Managed Care – PPO | Admitting: Student

## 2024-01-26 ENCOUNTER — Ambulatory Visit: Payer: BC Managed Care – PPO | Attending: Medical | Admitting: Medical

## 2024-01-26 NOTE — Progress Notes (Deleted)
 Cardiology Office Note:    Date:  01/26/2024   ID:  Steven Pham, DOB 09-29-1961, MRN [id number]  PCP:  [facility], [facility]  [facility] Cardiologist:  Steven Bears, MD  [facility] Electrophysiologist:  None   Referring MD: Center, [facility]*   Chief Complaint: ***  History of Present Illness:    Steven Pham is a 63 y.o. male with a hx of portal vein thrombosis, hypertension, obstructive sleep apnea, chronic systolic heart failure and A-fib who presents for follow-up.  The patient was hospitalized in December 2022 with diverticulitis complicated by acute renal failure.  Was noted to have intermittent tachycardia and this had an echocardiogram done which showed EF of 20 to 25% with grade 3 diastolic dysfunction, mild to moderate mitral regurgitation.  Metoprolol was switched to Coreg and a small dose of losartan and spironolactone were added.  He was rehospitalized at the end of December with portal vein thrombosis thought to be due to increased inflammation related to diverticulitis.  He was hypotensive on presentation.  He was anticoagulated and discharged on Eliquis.  Due to hypotension and AKI amlodipine, Lasix, and losartan were held.  He was subsequently dosed with A-fib with RVR complicated by hypotension.  He converted to sinus rhythm with IV amiodarone.  Cardiac cath was planned but could not be done due to bacteremia.  Echo showed EF of 30 to 35%.  Cardiac cath in February 2023 showed normal coronary arteries, moderately reduced LVSF with an EF of 35 to 40%.  Right heart cath showed normal filling pressures, normal pulmonary pressures and normal cardiac output.  In the outpatient setting, patient was optimized on GDTM.   The patient was last seen via televisit for preop visit in February 2024.  No further workup was recommended prior to dental procedure.  Past Medical History:  Diagnosis Date   Anemia, chronic disease    Arthritis    Atrial fibrillation  (HCC)    2023   Cardiomyopathy (HCC)    a. 11/2021 Echo: EF 20-25%, glob HK. GrIII DD. Nl RV size/fxn. Mildly elev PASP. Sev dil LA, mildly dil RA. Mild to mod MR.   CKD (chronic kidney disease), stage III (HCC)    Gout    Hypertension    Mitral regurgitation 2022   mild > mod   NSVT (nonsustained ventricular tachycardia) (HCC) 11/2021   OSA (obstructive sleep apnea)    Portal vein thrombosis 2023   PSVT (paroxysmal supraventricular tachycardia) (HCC) 11/2021    Past Surgical History:  Procedure Laterality Date   RIGHT/LEFT HEART CATH AND CORONARY ANGIOGRAPHY N/A 02/11/2022   Procedure: RIGHT/LEFT HEART CATH AND CORONARY ANGIOGRAPHY;  Surgeon: Steven Ouch, MD;  Location: [facility] INVASIVE CV LAB;  Service: Cardiovascular;  Laterality: N/A;    Current Medications: No outpatient medications have been marked as taking for the 01/26/24 encounter (Appointment) with Fransico Michael, Dempsy Damiano H, PA-C.     Allergies:   Patient has no known allergies.   Social History   Socioeconomic History   Marital status: Single    Spouse name: Not on file   Number of children: Not on file   Years of education: Not on file   Highest education level: Not on file  Occupational History   Not on file  Tobacco Use   Smoking status: Never   Smokeless tobacco: Never  Vaping Use   Vaping status: Never Used  Substance and Sexual Activity   Alcohol use: No   Drug use: No  Sexual activity: Yes  Other Topics Concern   Not on file  Social History Narrative   Lives locally by himself.  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  Does not routinely exercise.   Social Drivers of Corporate investment banker Strain: Not on file  Food Insecurity: Not on file  Transportation Needs: Not on file  Physical Activity: Not on file  Stress: Not on file  Social Connections: Not on file     Family History: The patient's ***family history includes Coronary artery disease in his mother.  ROS:   Please see the history of  present illness.    *** All other systems reviewed and are negative.  EKGs/Labs/Other Studies Reviewed:    The following studies were reviewed today: ***  EKG:  EKG is *** ordered today.  The ekg ordered today demonstrates ***  Recent Labs: No results found for requested labs within last 365 days.  Recent Lipid Panel    Component Value Date/Time   CHOL 226 (H) 01/04/2022 0550   TRIG 305 (H) 01/04/2022 0550   HDL 14 (L) 01/04/2022 0550   CHOLHDL 16.1 01/04/2022 0550   VLDL 61 (H) 01/04/2022 0550   LDLCALC 151 (H) 01/04/2022 0550     Risk Assessment/Calculations:   {Does this patient have ATRIAL FIBRILLATION?:[phone]}   Physical Exam:    VS:  There were no vitals taken for this visit.    Wt Readings from Last 3 Encounters:  10/12/23 264 lb 15.9 oz (120.2 kg)  09/23/23 265 lb (120.2 kg)  05/18/23 260 lb 2.3 oz (118 kg)     GEN: *** Well nourished, well developed in no acute distress HEENT: Normal NECK: No JVD; No carotid bruits LYMPHATICS: No lymphadenopathy CARDIAC: ***RRR, no murmurs, rubs, gallops RESPIRATORY:  Clear to auscultation without rales, wheezing or rhonchi  ABDOMEN: Soft, non-tender, non-distended MUSCULOSKELETAL:  No edema; No deformity  SKIN: Warm and dry NEUROLOGIC:  Alert and oriented x 3 PSYCHIATRIC:  Normal affect   ASSESSMENT:    No diagnosis found. PLAN:    In order of problems listed above:  ***  Disposition: Follow up {follow up:15908} with ***   Shared Decision Making/Informed Consent   {Are you ordering a CV Procedure (e.g. stress test, cath, DCCV, TEE, etc)?   Press F2        :[id number]}    Signed, Leandra Vanderweele David Stall, PA-C  01/26/2024 7:50 AM    [facility]

## 2024-04-01 ENCOUNTER — Other Ambulatory Visit: Payer: Self-pay | Admitting: Cardiovascular Disease

## 2024-04-05 ENCOUNTER — Other Ambulatory Visit: Payer: Self-pay | Admitting: Cardiovascular Disease

## 2024-04-05 ENCOUNTER — Telehealth: Payer: Self-pay | Admitting: Cardiovascular Disease

## 2024-04-05 ENCOUNTER — Ambulatory Visit: Attending: Medical | Admitting: Medical

## 2024-04-05 ENCOUNTER — Encounter: Payer: Self-pay | Admitting: Medical

## 2024-04-05 VITALS — BP 160/100 | Resp 22 | Ht 63.0 in | Wt 268.5 lb

## 2024-04-05 DIAGNOSIS — I81 Portal vein thrombosis: Secondary | ICD-10-CM

## 2024-04-05 DIAGNOSIS — I428 Other cardiomyopathies: Secondary | ICD-10-CM

## 2024-04-05 DIAGNOSIS — I4819 Other persistent atrial fibrillation: Secondary | ICD-10-CM

## 2024-04-05 DIAGNOSIS — I5022 Chronic systolic (congestive) heart failure: Secondary | ICD-10-CM

## 2024-04-05 MED ORDER — AMIODARONE HCL 200 MG PO TABS: 200.0000 mg | ORAL_TABLET | Freq: Every day | ORAL | 3 refills | Status: DC

## 2024-04-05 MED ORDER — CARVEDILOL 3.125 MG PO TABS: 3.1250 mg | ORAL_TABLET | Freq: Two times a day (BID) | ORAL | 3 refills | Status: DC

## 2024-04-05 MED ORDER — APIXABAN 5 MG PO TABS: 5.0000 mg | ORAL_TABLET | Freq: Two times a day (BID) | ORAL | 3 refills | Status: AC

## 2024-04-05 MED ORDER — APIXABAN 5 MG PO TABS: 5.0000 mg | ORAL_TABLET | Freq: Two times a day (BID) | ORAL | 3 refills | Status: DC

## 2024-04-05 NOTE — Patient Instructions (Signed)
 Medication Instructions:  Your physician recommends the following medication changes.  DECREASE: Amiodarone to 200 mg by mouth daily  *If you need a refill on your cardiac medications before your next appointment, please call your pharmacy*  Lab Work: No labs ordered today   Testing/Procedures: No test ordered today   Follow-Up: At [facility], you and your health needs are our priority.  As part of our continuing mission to provide you with exceptional heart care, our providers are all part of one team.  This team includes your primary Cardiologist (physician) and Advanced Practice Providers or APPs (Physician Assistants and Nurse Practitioners) who all work together to provide you with the care you need, when you need it.  Your next appointment:   1 year(s)  Provider:   You may see Lorine Bears, MD or one of the following Advanced Practice Providers on your designated Care Team:   Nicolasa Ducking, NP Ames Dura, PA-C Eula Listen, PA-C Cadence [place], PA-C Charlsie Quest, NP Carlos Levering, NP    We recommend signing up for the patient portal called "MyChart".  Sign up information is provided on this After Visit Summary.  MyChart is used to connect with patients for Virtual Visits (Telemedicine).  Patients are able to view lab/test results, encounter notes, upcoming appointments, etc.  Non-urgent messages can be sent to your provider as well.   To learn more about what you can do with MyChart, go to [email or web address].

## 2024-04-05 NOTE — Progress Notes (Signed)
 Cardiology Office Note:  .   Date:  04/05/2024  ID:  Pham Steven, DOB [birth year], MRN [id number] PCP: [facility]  [facility] Providers Cardiologist:  Lorine Bears, MD {  History of Present Illness: .   Steven Pham is a 63 y.o. male with a pmh of chronic systolic heart failure, NICM, atrial fibrillation, HTN, obesity, OSA, and gout who presents for follow-up for Afib ad CHF.   He was hospitalized in December 2022 with diverticulitis complicated by acute renal failure.  He was noted to have intermittent tachycardia and thus had an echocardiogram which showed an EF of 20 to 25%, grade 3 diastolic dysfunction, mild to moderate MR.  He was switched from metoprolol to Coreg and losartan and spironolactone were added.  He was rehospitalized at the end of December with portal vein thrombosis thought to be due to increased inflammation related to his diverticulitis.  He was hypotensive on presentation.  He was anticoagulated and ultimately discharged on Eliquis.  Due to hypotension and AKI, amlodipine furosemide and losartan were held.  He subsequently was diagnosed with A-fib RVR complicated by hypotension.  He was hospitalized and was started on anticoagulation.  He converted to normal sinus rhythm with amiodarone.  Cardiac cath was planned but could not be done due to bacteremia.  Echo showed EF of 30 to 35%.  Coronary angiography done in February 2023 showed normal coronary arteries, moderate reduced LVSF with an EF of 35 to 40%.  Right heart cath showed normal filling pressures, normal pulmonary pressures and normal cardiac output.  Coreg and Entresto were added but the patient self discontinued due to dizziness.  Patient was last seen in November 2023 and was back in A-fib with a heart rate of 161.  Patient was restarted on oral amiodarone and metoprolol.  Patient was scheduled for cardioversion, but showed up and was in normal sinus rhythm.  Today, the patient is  overall Ok. He has been working 12 hours shifts. 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 He has trouble finding coverage. He sometimes runs out  of meds because he has to work. BP is high, but he has not taken BP meds today. He denies chest pain, SOB, lower leg edema, palpitations and heart racing. He is on low dose coreg and not metoprolol. He denies bleeding issues.    Studies Reviewed: Marland Kitchen   EKG Interpretation Date/Time:  Monday April 05 2024 13:40:26 EDT Ventricular Rate:  60 PR Interval:  180 QRS Duration:  100 QT Interval:  464 QTC Calculation: 464 R Axis:   -43  Text Interpretation: Normal sinus rhythm Left axis deviation Left ventricular hypertrophy with repolarization abnormality ( R in aVL , Cornell product , Romhilt-Estes ) Cannot rule out Septal infarct , age undetermined When compared with ECG of 22-Nov-2022 06:55, PREVIOUS ECG IS PRESENT Confirmed by Fransico Michael, Benoit Meech ([id number]) on 04/05/2024 2:03:18 PM    Echo 11/2021  1. Left ventricular ejection fraction, by estimation, is 20 to 25%. The  left ventricle has severely decreased function. The left ventricle  demonstrates global hypokinesis. The left ventricular internal cavity size  was mildly dilated. There is mild left  ventricular hypertrophy. Left ventricular diastolic parameters are  consistent with Grade III diastolic dysfunction (restrictive).   2. Right ventricular systolic function is normal. The right ventricular  size is normal. There is mildly elevated pulmonary artery systolic  pressure.   3. Left atrial size was severely dilated.   4. Right  atrial size was mildly dilated.   5. The mitral valve is normal in structure. Mild to moderate mitral valve  regurgitation. No evidence of mitral stenosis.   6. The aortic valve is normal in structure. Aortic valve regurgitation is  not visualized. No aortic stenosis is present.   7. The inferior vena cava is normal in size with greater than 50%  respiratory  variability, suggesting right atrial pressure of 3 mmHg.   Echo 12/2021 1. No valve vegetation noted.   2. Left ventricular ejection fraction, by estimation, is 30 to 35%. The  left ventricle has moderately decreased function. The left ventricle  demonstrates global hypokinesis. The left ventricular internal cavity size  was mildly to moderately dilated.  Left ventricular diastolic parameters are consistent with Grade II  diastolic dysfunction (pseudonormalization).   3. Right ventricular systolic function is normal. The right ventricular  size is normal. There is normal pulmonary artery systolic pressure. The  estimated right ventricular systolic pressure is 29.2 mmHg.   4. Left atrial size was moderately dilated.   5. The mitral valve is normal in structure. No evidence of mitral valve  regurgitation. No evidence of mitral stenosis.   6. The aortic valve is normal in structure. Aortic valve regurgitation is  not visualized. No aortic stenosis is present.   7. The inferior vena cava is normal in size with greater than 50%  respiratory variability, suggesting right atrial pressure of 3 mmHg.   R/L heart cath 01/2022   There is moderate left ventricular systolic dysfunction.   LV end diastolic pressure is normal.   The left ventricular ejection fraction is 35-45% by visual estimate.   1.  Normal coronary arteries. 2.  Moderately reduced LV systolic function with an EF of 35 to 40% with global hypokinesis 3.  Right heart catheterization showed normal right and left-sided filling pressures, normal pulmonary pressures and normal cardiac output.   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  Hemodynamically, he is euvolemic. I added small dose carvedilol and will try to add Entresto or an ARB upon follow-up if blood pressure allows.      Physical Exam:   VS:  BP (!) 160/100 (BP Location: Left Arm, Patient Position: Sitting, Cuff Size: Normal)    Resp (!) 22   Ht 5\' 3"  (1.6 m)   Wt 268 lb 8 oz (121.8 kg)   SpO2 97%   BMI 47.56 kg/m    Wt Readings from Last 3 Encounters:  04/05/24 268 lb 8 oz (121.8 kg)  10/12/23 264 lb 15.9 oz (120.2 kg)  09/23/23 265 lb (120.2 kg)    GEN: Well nourished, well developed in no acute distress NECK: No JVD; No carotid bruits CARDIAC: RRR, no murmurs, rubs, gallops RESPIRATORY:  Clear to auscultation without rales, wheezing or rhonchi  ABDOMEN: Soft, non-tender, non-distended EXTREMITIES:  No edema; No deformity   ASSESSMENT AND PLAN: .    Persistent Afib He is in NSR on EKG. He was previously on metoprolol, but this has since been switched to Coreg. Continue coreg 3.183m BID for rate control. He is taking amiodarone 200mg  BID, which was started 10/2022 when he was found to be in Afib. He is not a good long-term candidate for amiodarone. I will decrease this to 200mg  daily and ask MD of [place] to stop, although we may continue it due to moderately dilated left atrium on echo. Continue Eliquis 5mg  BID for stroke ppx. He denies bleeding issues. I will request labs  from PCP. I will refill coreg, Eliquis and amiodarone today.  Chronic systolic heart failure/NICM Echo 2022 LVEF 20-25%, G3DD. Echo 12/2021 LVEF 30-35%, G2DD. Heart cath 01/2022 showed LVEF 35-45% and normal coronaries. The patient is euvolemic on exam. AS above, continue Coreg 3.125mg  BID. Entresto previously stopped due to dizziness.   Portal vein thrombosis This was in the setting of diverticulitis. Continue Eliquis for both this and Afib.        Dispo: Follow-up in 1 year  Signed, Morgen Linebaugh David Stall, PA-C

## 2024-04-05 NOTE — Telephone Encounter (Signed)
 Amiodarone sent in at appointment

## 2024-04-05 NOTE — Telephone Encounter (Signed)
*  STAT* If patient is at the pharmacy, call can be transferred to refill team.   1. Which medications need to be refilled? (please list name of each medication and dose if known)   amiodarone (PACERONE) 200 MG tablet     2. Would you like to learn more about the convenience, safety, & potential cost savings by using the [facility]? No   3. Are you open to using the [facility] (Type [facility].) No   4. Which pharmacy/location (including street and city if local pharmacy) is medication to be sent to? [facility] - [place], [place] - [address]    5. Do they need a 30 day or 90 day supply? 90 day  Pt has scheduled appt for this afternoon

## 2024-04-09 ENCOUNTER — Telehealth: Payer: Self-pay

## 2024-04-09 DIAGNOSIS — I4819 Other persistent atrial fibrillation: Secondary | ICD-10-CM

## 2024-04-09 NOTE — Telephone Encounter (Signed)
 Attempted to contact pt.  Unable to leave voice mail  Fransico Michael, Cadence H, PA-C  Parke Poisson, RN Per MD, repeat echo and refer to  EP for ablation

## 2024-04-19 NOTE — Telephone Encounter (Signed)
 Pt made aware of recommendations and verbalized understanding. ECHO and EP orders placed. Message sent to scheduling.

## 2024-05-25 ENCOUNTER — Ambulatory Visit: Attending: Medical

## 2024-05-26 ENCOUNTER — Ambulatory Visit: Admitting: Cardiology

## 2024-06-04 ENCOUNTER — Ambulatory Visit: Attending: Medical

## 2024-06-04 DIAGNOSIS — I4819 Other persistent atrial fibrillation: Secondary | ICD-10-CM

## 2024-06-05 LAB — ECHOCARDIOGRAM COMPLETE
AR max vel: 1.91 cm2
AV Area VTI: 1.97 cm2
AV Area mean vel: 1.82 cm2
AV Mean grad: 3 mmHg
AV Peak grad: 6 mmHg
Ao pk vel: 1.22 m/s
Area-P 1/2: 3.99 cm2
Calc EF: 50 %
S' Lateral: 4.4 cm
Single Plane A2C EF: 44.7 %
Single Plane A4C EF: 52.8 %

## 2024-06-11 ENCOUNTER — Ambulatory Visit: Payer: Self-pay | Admitting: Medical

## 2024-06-11 DIAGNOSIS — I5041 Acute combined systolic (congestive) and diastolic (congestive) heart failure: Secondary | ICD-10-CM

## 2024-06-30 ENCOUNTER — Telehealth: Payer: Self-pay | Admitting: Family

## 2024-06-30 NOTE — Progress Notes (Deleted)
 Advanced Heart Failure Clinic Note   Referring Physician: Mikey Fishman, PA PCP: [facility] Cardiologist: Deatrice Cage, MD / Fishman, Cadence PA  Chief Complaint:    HPI:  Mr Steven Pham is a 63 y/o male who was kindly referred by Mikey Fishman, PA. He has a history of chronic systolic heart failure, NICM, atrial fibrillation, HTN, obesity, OSA, portal vein thrombosis, anemia and gout   Admitted in December 2022 with diverticulitis complicated by acute renal failure.  He was noted to have intermittent tachycardia and thus had an echocardiogram which showed an EF of 20 to 25%, grade 3 diastolic dysfunction, mild to moderate MR.  He was switched from metoprolol  to Coreg  and losartan  and spironolactone  were added.   Rehospitalized at the end of December 2022 with portal vein thrombosis thought to be due to increased inflammation related to his diverticulitis.  He was hypotensive on presentation.  He was anticoagulated and ultimately discharged on Eliquis .  Due to hypotension and AKI, amlodipine  furosemide  and losartan  were held.  He subsequently was diagnosed with A-fib RVR complicated by hypotension.  He was hospitalized and was started on anticoagulation.  He converted to normal sinus rhythm with amiodarone .  Cardiac cath was planned but could not be done due to bacteremia.  Echo 12/18/21: showed EF of 30 to 35%.  Coronary angiography done in February 2023 showed normal coronary arteries, moderate reduced LVSF with an EF of 35 to 40%.  Right heart cath showed normal filling pressures, normal pulmonary pressures and normal cardiac output.  Coreg  and Entresto  were added but the patient self discontinued due to dizziness.   Admitted 01/03/22 with chest pain and a feeling of his heart fluttering. Sent from cardiology office with AF RVR. Given IV amiodarone / IV heparin  and cardiology consulted. Also diagnosed with portal vein thrombosis, vascular surgery consulted. Needed 1 unit PRBC's.  Converted to PO amiodarone  and eliquis . Echo 01/06/22: EF 20 to 25%, grade 3 diastolic dysfunction. PICC line placed for IV antibiotics for sepsis due to e.coli.    R/LHC 02/11/22:   There is moderate left ventricular systolic dysfunction.   LV end diastolic pressure is normal.   The left ventricular ejection fraction is 35-45% by visual estimate.    1.  Normal coronary arteries.   2.  Moderately reduced LV systolic function with an EF of 35 to 40% with global hypokinesis   3.  Right heart catheterization showed normal right and left-sided filling pressures, normal pulmonary pressures and normal cardiac output.  Echo 06/04/24: EF 45-50%, mild LVH, G3DD, low normal RV, moderately elevated PA pressure 51.8 mmHg, severe LAE, moderate MR, mild dilatation of the ascending aorta measuring 40 mm.   He present today for his initial HF visit with a chief complaint of   Review of Systems: [y] = yes, [ ]  = no   General: Weight gain [ ] ; Weight loss [ ] ; Anorexia [ ] ; Fatigue [ ] ; Fever [ ] ; Chills [ ] ; Weakness [ ]   Cardiac: Chest pain/pressure [ ] ; Resting SOB [ ] ; Exertional SOB [ ] ; Orthopnea [ ] ; Pedal Edema [ ] ; Palpitations [ ] ; Syncope [ ] ; Presyncope [ ] ; Paroxysmal nocturnal dyspnea[ ]   Pulmonary: Cough [ ] ; Wheezing[ ] ; Hemoptysis[ ] ; Sputum [ ] ; Snoring [ ]   GI: Vomiting[ ] ; Dysphagia[ ] ; Melena[ ] ; Hematochezia [ ] ; Heartburn[ ] ; Abdominal pain [ ] ; Constipation [ ] ; Diarrhea [ ] ; BRBPR [ ]   GU: Hematuria[ ] ; Dysuria [ ] ; Nocturia[ ]   Vascular: Pain in  legs with walking [ ] ; Pain in feet with lying flat [ ] ; Non-healing sores [ ] ; Stroke [ ] ; TIA [ ] ; Slurred speech [ ] ;  Neuro: Headaches[ ] ; Vertigo[ ] ; Seizures[ ] ; Paresthesias[ ] ;Blurred vision [ ] ; Diplopia [ ] ; Vision changes [ ]   Ortho/Skin: Arthritis [ ] ; Joint pain [ ] ; Muscle pain [ ] ; Joint swelling [ ] ; Back Pain [ ] ; Rash [ ]   Psych: Depression[ ] ; Anxiety[ ]   Heme: Bleeding problems [ ] ; Clotting disorders [ ] ; Anemia [ ]    Endocrine: Diabetes [ ] ; Thyroid dysfunction[ ]    Past Medical History:  Diagnosis Date   Anemia, chronic disease    Arthritis    Atrial fibrillation (HCC)    2023   Cardiomyopathy (HCC)    a. 11/2021 Echo: EF 20-25%, glob HK. GrIII DD. Nl RV size/fxn. Mildly elev PASP. Sev dil LA, mildly dil RA. Mild to mod MR.   CKD (chronic kidney disease), stage III (HCC)    Gout    Hypertension    Mitral regurgitation 2022   mild > mod   NSVT (nonsustained ventricular tachycardia) (HCC) 11/2021   OSA (obstructive sleep apnea)    Portal vein thrombosis 2023   PSVT (paroxysmal supraventricular tachycardia) (HCC) 11/2021    Current Outpatient Medications  Medication Sig Dispense Refill   acetaminophen  (TYLENOL ) 650 MG CR tablet Take 650 mg by mouth every 8 (eight) hours as needed for pain.     amiodarone  (PACERONE ) 200 MG tablet Take 1 tablet (200 mg total) by mouth daily. 90 tablet 3   amoxicillin  (AMOXIL ) 875 MG tablet Take 1 tablet (875 mg total) by mouth 2 (two) times daily. 20 tablet 0   apixaban  (ELIQUIS ) 5 MG TABS tablet Take 1 tablet (5 mg total) by mouth 2 (two) times daily. 180 tablet 3   carvedilol  (COREG ) 3.125 MG tablet Take 1 tablet (3.125 mg total) by mouth 2 (two) times daily with a meal. 180 tablet 3   colchicine  0.6 MG tablet Take 1 tablet (0.6 mg total) by mouth daily. 30 tablet 0   famotidine  (PEPCID ) 20 MG tablet Take 1 tablet (20 mg total) by mouth 2 (two) times daily. (Patient not taking: Reported on 04/05/2024) 60 tablet 0   metoprolol  tartrate (LOPRESSOR ) 25 MG tablet Take 1 tablet (25 mg total) by mouth 2 (two) times daily. 60 tablet 1   No current facility-administered medications for this visit.    No Known Allergies    Social History   Socioeconomic History   Marital status: Single    Spouse name: Not on file   Number of children: Not on file   Years of education: Not on file   Highest education level: Not on file  Occupational History   Not on file   Tobacco Use   Smoking status: Never   Smokeless tobacco: Never  Vaping Use   Vaping status: Never Used  Substance and Sexual Activity   Alcohol use: No   Drug use: No   Sexual activity: Yes  Other Topics Concern   Not on file  Social History Narrative   Lives locally by himself.  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  Does not routinely exercise.   Social Drivers of Corporate investment banker Strain: Not on file  Food Insecurity: Not on file  Transportation Needs: Not on file  Physical Activity: Not on file  Stress: Not on file  Social Connections: Not on file  Intimate Partner Violence: Not on  file      Family History  Problem Relation Age of Onset   Coronary artery disease Mother        PHYSICAL EXAM: General:  Well appearing. No respiratory difficulty HEENT: normal Neck: supple. no JVD. Carotids 2+ bilat; no bruits. No lymphadenopathy or thyromegaly appreciated. Cor: PMI nondisplaced. Regular rate & rhythm. No rubs, gallops or murmurs. Lungs: clear Abdomen: soft, nontender, nondistended. No hepatosplenomegaly. No bruits or masses. Good bowel sounds. Extremities: no cyanosis, clubbing, rash, edema Neuro: alert & oriented x 3, cranial nerves grossly intact. moves all 4 extremities w/o difficulty. Affect pleasant.  ECG:   ASSESSMENT & PLAN:  1: Chronic heart failure with mildly reduced ejection fraction- - suspect due to - NYHA class - euvolemic - weighing daily - Echo 12/18/21: EF of 30 to 35%. - Echo 01/06/22: EF 20 to 25%, grade 3 diastolic dysfunction. - Echo 06/04/24: EF 45-50%, mild LVH, G3DD, low normal RV, moderately elevated PA pressure 51.8 mmHg, severe LAE, moderate MR, mild dilatation of the ascending aorta measuring 40 mm.  - continue  - BNP 01/11/22 was 341.9  2: HTN- - BP - sees PCP at [facility] - BMET 01/11/22 reviewed: sodium 137, potassium 3.2, creatinine 0.88 & GFR >60  3: OSA- -   4: Atrial fibrillation- - saw cardiology Dene)  04/25   Ellouise DELENA Class, FNP 06/30/24

## 2024-06-30 NOTE — Telephone Encounter (Signed)
 Called to confirm/remind patient of their appointment at the [facility] on 07/01/24.   Appointment:   [] Confirmed  [] Left mess   [x] No answer/No voice mail  [] VM Full/unable to leave message  [] Phone not in service  Patient reminded to bring all medications and/or complete list.  Confirmed patient has transportation. Gave directions, instructed to utilize valet parking.

## 2024-07-01 ENCOUNTER — Encounter: Admitting: Family

## 2024-07-01 NOTE — Telephone Encounter (Signed)
 Patient did not show for his initial Heart Failure Clinic appointment on 07/01/24. Have reached out to referring provider to let them know that patient didn't show.

## 2024-08-10 ENCOUNTER — Telehealth: Payer: Self-pay | Admitting: Cardiovascular Disease

## 2024-08-10 ENCOUNTER — Other Ambulatory Visit: Payer: Self-pay

## 2024-08-10 DIAGNOSIS — I4819 Other persistent atrial fibrillation: Secondary | ICD-10-CM

## 2024-08-10 MED ORDER — CARVEDILOL 3.125 MG PO TABS: 3.1250 mg | ORAL_TABLET | Freq: Two times a day (BID) | ORAL | 2 refills | Status: AC

## 2024-08-10 MED ORDER — AMIODARONE HCL 200 MG PO TABS: 200.0000 mg | ORAL_TABLET | Freq: Every day | ORAL | 2 refills | Status: DC

## 2024-08-10 MED ORDER — COLCHICINE 0.6 MG PO TABS: 0.6000 mg | ORAL_TABLET | Freq: Every day | ORAL | 2 refills | Status: AC

## 2024-08-10 NOTE — Telephone Encounter (Signed)
*  STAT* If patient is at the pharmacy, call can be transferred to refill team.   1. Which medications need to be refilled? (please list name of each medication and dose if known)  carvedilol (COREG) 3.125 MG tablet   amiodarone (PACERONE) 200 MG tablet  colchicine 0.6 MG tablet   2. Which pharmacy/location (including street and city if local pharmacy) is medication to be sent to? [id number] - [place], [place] - [address]   3. Do they need a 30 day or 90 day supply?  90 day supply

## 2024-08-10 NOTE — Telephone Encounter (Signed)
 RX sent in

## 2025-01-05 ENCOUNTER — Telehealth: Payer: Self-pay | Admitting: Cardiovascular Disease

## 2025-01-05 DIAGNOSIS — I4819 Other persistent atrial fibrillation: Secondary | ICD-10-CM

## 2025-01-05 MED ORDER — AMIODARONE HCL 200 MG PO TABS: 200.0000 mg | ORAL_TABLET | Freq: Every day | ORAL | 1 refills | Status: AC

## 2025-01-05 NOTE — Telephone Encounter (Signed)
 Refills has been sent to the pharmacy.

## 2025-01-05 NOTE — Telephone Encounter (Signed)
" °*  STAT* If patient is at the pharmacy, call can be transferred to refill team.   1. Which medications need to be refilled? (please list name of each medication and dose if known) amiodarone  (PACERONE ) 200 MG tablet   2. Which pharmacy/location (including street and city if local pharmacy) is medication to be sent to? [id number] [place], [place] - [address] Phone: [phone]  Fax: [phone]     3. Do they need a 30 day or 90 day supply? 90  "
